# Patient Record
Sex: Male | Born: 1950 | Race: White | Hispanic: No | Marital: Married | Smoking: Never smoker
Health system: Southern US, Community
[De-identification: ages and names within clinical notes are randomized; demographics above are authoritative.]

## PROBLEM LIST (undated history)

## (undated) DIAGNOSIS — K7460 Unspecified cirrhosis of liver: Secondary | ICD-10-CM

## (undated) DIAGNOSIS — E119 Type 2 diabetes mellitus without complications: Secondary | ICD-10-CM

## (undated) HISTORY — PX: TOTAL KNEE ARTHROPLASTY: SHX125

## (undated) HISTORY — PX: ROTATOR CUFF REPAIR: SHX139

---

## 2005-03-11 ENCOUNTER — Emergency Department: Payer: Self-pay | Admitting: Emergency Medicine

## 2007-08-05 ENCOUNTER — Other Ambulatory Visit: Payer: Self-pay

## 2007-08-05 ENCOUNTER — Ambulatory Visit: Payer: Self-pay | Admitting: Specialist

## 2007-08-12 ENCOUNTER — Ambulatory Visit: Payer: Self-pay | Admitting: Specialist

## 2007-09-20 ENCOUNTER — Emergency Department: Payer: Self-pay | Admitting: Emergency Medicine

## 2007-09-25 ENCOUNTER — Emergency Department: Payer: Self-pay | Admitting: Internal Medicine

## 2007-10-14 ENCOUNTER — Ambulatory Visit: Payer: Self-pay | Admitting: Specialist

## 2007-10-25 ENCOUNTER — Ambulatory Visit (HOSPITAL_COMMUNITY): Admission: RE | Admit: 2007-10-25 | Discharge: 2007-10-25 | Payer: Self-pay | Admitting: Neurosurgery

## 2007-12-01 ENCOUNTER — Encounter: Payer: Self-pay | Admitting: Neurosurgery

## 2007-12-02 ENCOUNTER — Encounter: Payer: Self-pay | Admitting: Neurosurgery

## 2008-01-02 ENCOUNTER — Encounter: Payer: Self-pay | Admitting: Neurosurgery

## 2008-01-25 ENCOUNTER — Ambulatory Visit: Payer: Self-pay | Admitting: Neurosurgery

## 2008-01-30 ENCOUNTER — Encounter: Payer: Self-pay | Admitting: Neurosurgery

## 2009-07-16 ENCOUNTER — Ambulatory Visit: Payer: Self-pay | Admitting: Specialist

## 2009-07-23 ENCOUNTER — Inpatient Hospital Stay: Payer: Self-pay | Admitting: Specialist

## 2009-09-11 ENCOUNTER — Encounter: Payer: Self-pay | Admitting: Specialist

## 2009-10-01 ENCOUNTER — Encounter: Payer: Self-pay | Admitting: Specialist

## 2009-10-31 ENCOUNTER — Encounter: Payer: Self-pay | Admitting: Specialist

## 2010-08-12 ENCOUNTER — Emergency Department: Payer: Self-pay | Admitting: Emergency Medicine

## 2011-04-15 NOTE — Op Note (Signed)
Jesus [name], Jesus [name]                 ACCOUNT NO.:  [id number]   MEDICAL RECORD NO.:  [id number]          PATIENT TYPE:  AMB   LOCATION:  SDS                          FACILITY:  [facility]   PHYSICIAN:  Henry A. Pool, M.D.    DATE OF BIRTH:  [birth year]   DATE OF PROCEDURE:  10/25/2007  DATE OF DISCHARGE:                               OPERATIVE REPORT   SERVICE:  Neurosurgery.   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   PROCEDURE:  Left L4-5 laminotomy with microdiskectomy.   SURGEON:  Kathaleen Maser. Pool, M.D.   ASSISTANT:  Donalee Citrin, M.D.   ANESTHESIA:  General orotracheal.   INDICATIONS FOR PROCEDURE:  Mr. Favila is a 60 year old male with a  history of severe back and left lower extremity pain failing  conservative management.  Workup demonstrates evidence of a large  central disk herniation at L4-5 with marked compression of the thecal  sac slightly towards the left.  The patient has been counseled as to his  options.  He decided proceed with a left-sided L4-5 laminotomy and  microdiskectomy in hopes of improving his symptoms.   DESCRIPTION OF PROCEDURE:  The patient was placed on the operating room  table in the supine position and after and adequate level of anesthesia  was achieved, the patient prone onto Wilson frame, appropriately padded,  the patient's lumbar region was prepped and draped sterilely.  A 10  blade was used to make a linear incision overlying the L4-5 interspace.  This was carried sharply in the midline, subperiosteal dissection then  performed exposing the lamina of the facet joints at L4 and L5 on the  left side.  Deep self-retaining retractors placed, intraoperative x-rays  taken, the level was confirmed.  A laminotomy was then performed using a  high-speed drill and Kerrison rongeurs to remove the inferior aspect of  the lamina  of L4, medial aspect of the L4-5 facet joint and the superior  rim of the L5 lamina. Note the level originally exposed on the  localizing x-ray was L5-S1, our dissection was redirected one level  cephalad from the intraoperative x-ray.  Ligament flavum was then  elevated and resected in a piecemeal fashion using Kerrison rongeurs.  The underlying thecal sac at the exiting L5 nerve root were identified.  The microscope was then brought onto the field using microdissection of  the left-sided L5 nerve root and underlying disk herniation.  The  epidural venous plexus was coagulated and cut.  The thecal sac and L5  nerve root were gently mobilized and tracked towards the midline. The  disk space was then incised with a 15 blade in a rectangular fashion.  Wide disk space clean-out was then achieved using pituitary rongeurs,  __________  rongeurs and Epstein curettes. All elements including the  disk herniation were completely resected including a very large amount  of central freely fragmented parts.  All loose or obviously degenerative  disk material was removed from the interspace.  At this  point, a very  thorough decompression had been achieved.  There is no evidence of  injury to the thecal sac or nerve roots.  There is no evidence of any  compression upon the thecal sac or nerve roots.  The wound is then  irrigated with antibiotic solution.  Gelfoam was placed topically for  hemostasis and found to be good. The microscope and retractors were  removed.  Epidural bleeding was heavy throughout the case.  This was  controlled at the end of the  case and at the time of closure,  there was no evidence of any ongoing  epidural bleeding.  The wound was then closed in typical fashion.  Steri-  Strips and sterile dressings were applied. There were no apparent  complications. The patient tolerated the procedure well and he returns  to the recovery room postoperatively.            ______________________________  Kathaleen Maser Pool, M.D.     HAP/MEDQ  D:  10/25/2007  T:  10/25/2007  Job:  [id number]

## 2011-09-09 LAB — BASIC METABOLIC PANEL
BUN: 23
Chloride: 103
GFR calc Af Amer: >60
Potassium: 4.7

## 2011-09-09 LAB — CBC
HCT: 40.3
Hemoglobin: 13.5
MCV: 86.1
Platelets: 169
WBC: 5.3

## 2011-09-09 LAB — TYPE AND SCREEN: Antibody Screen: NEGATIVE

## 2011-09-09 LAB — DIFFERENTIAL
Eosinophils Relative: 8 — ABNORMAL HIGH
Lymphocytes Relative: 33
Lymphs Abs: 1.7
Monocytes Absolute: 0.5
Monocytes Relative: 10
Neutro Abs: 2.6

## 2011-09-09 LAB — ABO/RH: ABO/RH(D): O POS

## 2017-05-28 ENCOUNTER — Emergency Department: Payer: Medicare PPO

## 2017-05-28 ENCOUNTER — Encounter: Payer: Self-pay | Admitting: *Deleted

## 2017-05-28 ENCOUNTER — Inpatient Hospital Stay
Admission: EM | Admit: 2017-05-28 | Discharge: 2017-06-04 | DRG: 872 | Disposition: A | Discharge status: Home / Self Care | Payer: Medicare PPO | Attending: Internal Medicine | Admitting: Internal Medicine

## 2017-05-28 DIAGNOSIS — K7290 Hepatic failure, unspecified without coma: Secondary | ICD-10-CM | POA: Diagnosis present

## 2017-05-28 DIAGNOSIS — K7460 Unspecified cirrhosis of liver: Secondary | ICD-10-CM | POA: Diagnosis present

## 2017-05-28 DIAGNOSIS — E119 Type 2 diabetes mellitus without complications: Secondary | ICD-10-CM | POA: Diagnosis present

## 2017-05-28 DIAGNOSIS — Z96653 Presence of artificial knee joint, bilateral: Secondary | ICD-10-CM | POA: Diagnosis present

## 2017-05-28 DIAGNOSIS — R7881 Bacteremia: Secondary | ICD-10-CM | POA: Diagnosis not present

## 2017-05-28 DIAGNOSIS — A419 Sepsis, unspecified organism: Secondary | ICD-10-CM | POA: Diagnosis present

## 2017-05-28 DIAGNOSIS — Z794 Long term (current) use of insulin: Secondary | ICD-10-CM

## 2017-05-28 DIAGNOSIS — B952 Enterococcus as the cause of diseases classified elsewhere: Secondary | ICD-10-CM | POA: Diagnosis present

## 2017-05-28 DIAGNOSIS — N390 Urinary tract infection, site not specified: Secondary | ICD-10-CM | POA: Diagnosis present

## 2017-05-28 DIAGNOSIS — Z880 Allergy status to penicillin: Secondary | ICD-10-CM | POA: Diagnosis not present

## 2017-05-28 DIAGNOSIS — I340 Nonrheumatic mitral (valve) insufficiency: Secondary | ICD-10-CM | POA: Diagnosis not present

## 2017-05-28 DIAGNOSIS — A409 Streptococcal sepsis, unspecified: Secondary | ICD-10-CM | POA: Diagnosis present

## 2017-05-28 DIAGNOSIS — R509 Fever, unspecified: Secondary | ICD-10-CM

## 2017-05-28 HISTORY — DX: Unspecified cirrhosis of liver: K74.60

## 2017-05-28 HISTORY — DX: Type 2 diabetes mellitus without complications: E11.9

## 2017-05-28 LAB — CBC WITH DIFFERENTIAL/PLATELET
BASOS PCT: 1 %
Basophils Absolute: 0 10*3/uL (ref 0–0.1)
EOS PCT: 1 %
Eosinophils Absolute: 0 10*3/uL (ref 0–0.7)
HCT: 33.7 % — ABNORMAL LOW (ref 40.0–52.0)
Hemoglobin: 11 g/dL — ABNORMAL LOW (ref 13.0–18.0)
LYMPHS ABS: 0.1 10*3/uL — AB (ref 1.0–3.6)
Lymphocytes Relative: 5 %
MCH: 27.1 pg (ref 26.0–34.0)
MCHC: 32.7 g/dL (ref 32.0–36.0)
MCV: 82.8 fL (ref 80.0–100.0)
MONO ABS: 0.1 10*3/uL — AB (ref 0.2–1.0)
MONOS PCT: 2 %
NEUTROS ABS: 2.3 10*3/uL (ref 1.4–6.5)
Neutrophils Relative %: 91 %
PLATELETS: 74 10*3/uL — AB (ref 150–440)
RBC: 4.07 MIL/uL — ABNORMAL LOW (ref 4.40–5.90)
RDW: 18.3 % — ABNORMAL HIGH (ref 11.5–14.5)
WBC: 2.5 10*3/uL — ABNORMAL LOW (ref 3.8–10.6)

## 2017-05-28 LAB — URINALYSIS, COMPLETE (UACMP) WITH MICROSCOPIC
BACTERIA UA: NONE SEEN
BILIRUBIN URINE: NEGATIVE
KETONES UR: NEGATIVE mg/dL
NITRITE: NEGATIVE
PROTEIN: 30 mg/dL — AB
Specific Gravity, Urine: 1.009 (ref 1.005–1.030)
pH: 5 (ref 5.0–8.0)

## 2017-05-28 LAB — BLOOD GAS, ARTERIAL
ACID-BASE DEFICIT: 0.7 mmol/L (ref 0.0–2.0)
BICARBONATE: 23.1 mmol/L (ref 20.0–28.0)
FIO2: 0.21
O2 Saturation: 90.3 %
PH ART: 7.44 (ref 7.350–7.450)
Patient temperature: 37
pCO2 arterial: 34 mmHg (ref 32.0–48.0)
pO2, Arterial: 57 mmHg — ABNORMAL LOW (ref 83.0–108.0)

## 2017-05-28 LAB — PROTIME-INR
INR: 1.29
PROTHROMBIN TIME: 16.2 s — AB (ref 11.4–15.2)

## 2017-05-28 LAB — COMPREHENSIVE METABOLIC PANEL
ALBUMIN: 3.1 g/dL — AB (ref 3.5–5.0)
ALT: 24 U/L (ref 17–63)
AST: 49 U/L — AB (ref 15–41)
Alkaline Phosphatase: 124 U/L (ref 38–126)
Anion gap: 10 (ref 5–15)
BUN: 14 mg/dL (ref 6–20)
CHLORIDE: 107 mmol/L (ref 101–111)
CO2: 22 mmol/L (ref 22–32)
CREATININE: 1.16 mg/dL (ref 0.61–1.24)
Calcium: 9.2 mg/dL (ref 8.9–10.3)
GFR calc Af Amer: >60 mL/min (ref >60)
GLUCOSE: 258 mg/dL — AB (ref 65–99)
Potassium: 3.5 mmol/L (ref 3.5–5.1)
SODIUM: 139 mmol/L (ref 135–145)
Total Bilirubin: 1.2 mg/dL (ref 0.3–1.2)
Total Protein: 6.8 g/dL (ref 6.5–8.1)

## 2017-05-28 LAB — GLUCOSE, CAPILLARY
Glucose-Capillary: 305 mg/dL — ABNORMAL HIGH (ref 65–99)
Glucose-Capillary: 265 mg/dL — ABNORMAL HIGH (ref 65–99)
Glucose-Capillary: 253 mg/dL — ABNORMAL HIGH (ref 65–99)

## 2017-05-28 LAB — TYPE AND SCREEN
ABO/RH(D): O POS
Antibody Screen: NEGATIVE

## 2017-05-28 LAB — TSH: TSH: 1.524 u[IU]/mL (ref 0.350–4.500)

## 2017-05-28 LAB — LACTIC ACID, PLASMA
LACTIC ACID, VENOUS: 4.6 mmol/L — AB (ref 0.5–1.9)
Lactic Acid, Venous: 3.1 mmol/L (ref 0.5–1.9)

## 2017-05-28 MED ORDER — INSULIN ASPART 100 UNIT/ML ~~LOC~~ SOLN
0.0000 [IU] | Freq: Three times a day (TID) | SUBCUTANEOUS | Status: DC
  Administered 2017-05-28: 5 [IU] via SUBCUTANEOUS
  Administered 2017-05-29: 2 [IU] via SUBCUTANEOUS
  Administered 2017-05-29 (×2): 3 [IU] via SUBCUTANEOUS
  Administered 2017-05-30: 7 [IU] via SUBCUTANEOUS
  Administered 2017-05-30: 2 [IU] via SUBCUTANEOUS
  Administered 2017-05-30 – 2017-05-31 (×3): 3 [IU] via SUBCUTANEOUS
  Administered 2017-05-31 – 2017-06-01 (×2): 5 [IU] via SUBCUTANEOUS
  Administered 2017-06-01: 3 [IU] via SUBCUTANEOUS
  Administered 2017-06-01 – 2017-06-02 (×3): 5 [IU] via SUBCUTANEOUS
  Administered 2017-06-02: 2 [IU] via SUBCUTANEOUS
  Administered 2017-06-03: 7 [IU] via SUBCUTANEOUS
  Administered 2017-06-03: 5 [IU] via SUBCUTANEOUS
  Administered 2017-06-03: 3 [IU] via SUBCUTANEOUS
  Administered 2017-06-04: 2 [IU] via SUBCUTANEOUS
  Administered 2017-06-04: 8 [IU] via SUBCUTANEOUS
  Filled 2017-05-28 (×21): qty 1

## 2017-05-28 MED ORDER — LEVOFLOXACIN IN D5W 750 MG/150ML IV SOLN
750.0000 mg | Freq: Once | INTRAVENOUS | Status: AC
  Administered 2017-05-28: 750 mg via INTRAVENOUS
  Filled 2017-05-28: qty 150

## 2017-05-28 MED ORDER — DEXTROSE 5 % IV SOLN
1.0000 g | Freq: Three times a day (TID) | INTRAVENOUS | Status: DC
  Administered 2017-05-28: 1 g via INTRAVENOUS
  Filled 2017-05-28 (×3): qty 1

## 2017-05-28 MED ORDER — DEXTROSE 5 % IV SOLN
1.0000 g | INTRAVENOUS | Status: DC
  Administered 2017-05-28 – 2017-05-29 (×2): 1 g via INTRAVENOUS
  Filled 2017-05-28 (×2): qty 10

## 2017-05-28 MED ORDER — INSULIN ASPART 100 UNIT/ML ~~LOC~~ SOLN
0.0000 [IU] | Freq: Every day | SUBCUTANEOUS | Status: DC
  Administered 2017-05-28: 3 [IU] via SUBCUTANEOUS
  Administered 2017-05-29: 2 [IU] via SUBCUTANEOUS
  Administered 2017-05-30: 5 [IU] via SUBCUTANEOUS
  Administered 2017-05-31 – 2017-06-01 (×2): 4 [IU] via SUBCUTANEOUS
  Administered 2017-06-02: 5 [IU] via SUBCUTANEOUS
  Administered 2017-06-03: 4 [IU] via SUBCUTANEOUS
  Filled 2017-05-28 (×7): qty 1

## 2017-05-28 MED ORDER — DEXTROSE 5 % IV SOLN
2.0000 g | Freq: Once | INTRAVENOUS | Status: AC
  Administered 2017-05-28: 2 g via INTRAVENOUS
  Filled 2017-05-28: qty 2

## 2017-05-28 MED ORDER — ENOXAPARIN SODIUM 40 MG/0.4ML ~~LOC~~ SOLN: 40.0000 mg | SUBCUTANEOUS | Status: DC

## 2017-05-28 MED ORDER — OXYCODONE-ACETAMINOPHEN 5-325 MG PO TABS
1.0000 | ORAL_TABLET | ORAL | Status: DC | PRN
  Administered 2017-05-28 – 2017-06-03 (×21): 1 via ORAL
  Filled 2017-05-28 (×21): qty 1

## 2017-05-28 MED ORDER — SODIUM CHLORIDE 0.9 % IV BOLUS (SEPSIS)
1000.0000 mL | Freq: Once | INTRAVENOUS | Status: AC
  Administered 2017-05-28: 1000 mL via INTRAVENOUS

## 2017-05-28 MED ORDER — CEFTRIAXONE SODIUM-DEXTROSE 1-3.74 GM-% IV SOLR
1.0000 g | INTRAVENOUS | Status: DC
  Filled 2017-05-28: qty 50

## 2017-05-28 MED ORDER — SODIUM CHLORIDE 0.9 % IV SOLN
INTRAVENOUS | Status: DC
  Administered 2017-05-28 – 2017-05-29 (×5): via INTRAVENOUS

## 2017-05-28 MED ORDER — LEVOFLOXACIN IN D5W 750 MG/150ML IV SOLN: 750.0000 mg | INTRAVENOUS | Status: DC

## 2017-05-28 MED ORDER — PANTOPRAZOLE SODIUM 40 MG IV SOLR
40.0000 mg | Freq: Two times a day (BID) | INTRAVENOUS | Status: DC
  Administered 2017-05-28 – 2017-06-01 (×10): 40 mg via INTRAVENOUS
  Filled 2017-05-28 (×10): qty 40

## 2017-05-28 MED ORDER — ONDANSETRON HCL 4 MG/2ML IJ SOLN: 4.0000 mg | Freq: Four times a day (QID) | INTRAMUSCULAR | Status: DC | PRN

## 2017-05-28 MED ORDER — VANCOMYCIN HCL 10 G IV SOLR
1250.0000 mg | Freq: Two times a day (BID) | INTRAVENOUS | Status: DC
  Administered 2017-05-28: 1250 mg via INTRAVENOUS
  Filled 2017-05-28 (×2): qty 1250

## 2017-05-28 MED ORDER — ONDANSETRON HCL 4 MG PO TABS: 4.0000 mg | ORAL_TABLET | Freq: Four times a day (QID) | ORAL | Status: DC | PRN

## 2017-05-28 MED ORDER — VANCOMYCIN HCL IN DEXTROSE 1-5 GM/200ML-% IV SOLN
1000.0000 mg | INTRAVENOUS | Status: AC
  Administered 2017-05-28: 1000 mg via INTRAVENOUS
  Filled 2017-05-28: qty 200

## 2017-05-28 MED ORDER — ACETAMINOPHEN 650 MG RE SUPP
975.0000 mg | Freq: Once | RECTAL | Status: AC
  Administered 2017-05-28: 975 mg via RECTAL
  Filled 2017-05-28: qty 1

## 2017-05-28 MED ORDER — DOCUSATE SODIUM 100 MG PO CAPS
100.0000 mg | ORAL_CAPSULE | Freq: Two times a day (BID) | ORAL | Status: DC
  Administered 2017-05-28 – 2017-06-03 (×11): 100 mg via ORAL
  Filled 2017-05-28 (×13): qty 1

## 2017-05-28 MED ORDER — PROPRANOLOL HCL 20 MG PO TABS
10.0000 mg | ORAL_TABLET | Freq: Three times a day (TID) | ORAL | Status: DC
  Administered 2017-05-28 – 2017-06-03 (×21): 10 mg via ORAL
  Filled 2017-05-28 (×20): qty 1

## 2017-05-28 MED ORDER — SODIUM CHLORIDE 0.9 % IV BOLUS (SEPSIS)
500.0000 mL | Freq: Once | INTRAVENOUS | Status: AC
  Administered 2017-05-28: 500 mL via INTRAVENOUS

## 2017-05-28 NOTE — Progress Notes (Signed)
Inpatient Diabetes Program Recommendations  AACE/ADA: New Consensus Statement on Inpatient Glycemic Control (2015)  Target Ranges:  Prepandial:   less than 140 mg/dL      Peak postprandial:   less than 180 mg/dL (1-2 hours)      Critically ill patients:  140 - 180 mg/dL   Results for Jesus [name], [name] (MRN [id number]) as of 05/28/2017 11:36  Ref. Range 05/28/2017 00:48  Glucose Latest Ref Range: 65 - 99 mg/dL 782258 (H)   Review of Glycemic Control  Diabetes history: No Outpatient Diabetes medications: NA Current orders for Inpatient glycemic control: None  Inpatient Diabetes Program Recommendations: Correction (SSI): While inpatient, please consider ordering CBGs with Novolog sensitive correction scale ACHS. HgbA1C: A1C in process. Diet: Please consider adding Carb Modified to Heart Healthy diet.  Thanks, Orlando PennerMarie Monzerrat Wellen, RN, MSN, CDE Diabetes Coordinator Inpatient Diabetes Program [phone] (Team Pager from 8am to 5pm)

## 2017-05-28 NOTE — ED Notes (Signed)
Pt transported to room 159 

## 2017-05-28 NOTE — H&P (Signed)
Jesus [name] is an 66 y.o. male.   Chief Complaint: Vomiting HPI: The patient with past medical history of cirrhosis of the liver presents to the emergency department due to vomiting. The patient states that his emesis was dark and could have appeared like coffee grounds but he is unsure. Dark staining of his lips corroborates his concern. In the emergency department he was initially febrile, tachycardic and tachypneic. Laboratory evaluation revealed leukopenia as well as elevated lactic acid. Code sepsis was initiated and the emergency department staff called the hospitalist service for admission.  Past Medical History:  Diagnosis Date  . Cirrhosis of liver [facility])     Past Surgical History:  Procedure Laterality Date  . ROTATOR CUFF REPAIR Right   . TOTAL KNEE ARTHROPLASTY Bilateral     Family History  Problem Relation Age of Onset  . Diabetes Mellitus II Father    Social History:  reports that he has never smoked. He has never used smokeless tobacco. He reports that he does not drink alcohol or use drugs.  Allergies:  Allergies  Allergen Reactions  . Penicillins Other (See Comments)    No prescriptions prior to admission.  [facility]  Results for orders placed or performed during the hospital encounter of 05/28/17 (from the past 48 hour(s))  Blood gas, arterial ([facility], [facility], [facility])     Status: Abnormal   Collection Time: 05/28/17 12:46 AM  Result Value Ref Range   FIO2 0.21    pH, Arterial 7.44 7.350 - 7.450   pCO2 arterial 34 32.0 - 48.0 mmHg   pO2, Arterial 57 (L) 83.0 - 108.0 mmHg   Bicarbonate 23.1 20.0 - 28.0 mmol/L   Acid-base deficit 0.7 0.0 - 2.0 mmol/L   O2 Saturation 90.3 %   Patient temperature 37.0    Collection site RIGHT RADIAL    Sample type ARTERIAL DRAW    Allens test (pass/fail) PASS PASS  Comprehensive metabolic panel     Status: Abnormal   Collection Time: 05/28/17 12:48 AM  Result Value Ref Range   Sodium 139 135 - 145  mmol/L   Potassium 3.5 3.5 - 5.1 mmol/L   Chloride 107 101 - 111 mmol/L   CO2 22 22 - 32 mmol/L   Glucose, Bld 258 (H) 65 - 99 mg/dL   BUN 14 6 - 20 mg/dL   Creatinine, Ser 1.16 0.61 - 1.24 mg/dL   Calcium 9.2 8.9 - 10.3 mg/dL   Total Protein 6.8 6.5 - 8.1 g/dL   Albumin 3.1 (L) 3.5 - 5.0 g/dL   AST 49 (H) 15 - 41 U/L   ALT 24 17 - 63 U/L   Alkaline Phosphatase 124 38 - 126 U/L   Total Bilirubin 1.2 0.3 - 1.2 mg/dL   GFR calc non Af Amer >60 >60 mL/min   GFR calc Af Amer >60 >60 mL/min    Comment: (NOTE) The eGFR has been calculated using the CKD EPI equation. This calculation has not been validated in all clinical situations. eGFR's persistently <60 mL/min signify possible Chronic Kidney Disease.    Anion gap 10 5 - 15  CBC WITH DIFFERENTIAL     Status: Abnormal   Collection Time: 05/28/17 12:48 AM  Result Value Ref Range   WBC 2.5 (L) 3.8 - 10.6 K/uL   RBC 4.07 (L) 4.40 - 5.90 MIL/uL   Hemoglobin 11.0 (L) 13.0 - 18.0 g/dL   HCT 33.7 (L) 40.0 - 52.0 %  MCV 82.8 80.0 - 100.0 fL   MCH 27.1 26.0 - 34.0 pg   MCHC 32.7 32.0 - 36.0 g/dL   RDW 18.3 (H) 11.5 - 14.5 %   Platelets 74 (L) 150 - 440 K/uL   Neutrophils Relative % 91 %   Lymphocytes Relative 5 %   Monocytes Relative 2 %   Eosinophils Relative 1 %   Basophils Relative 1 %   Neutro Abs 2.3 1.4 - 6.5 K/uL   Lymphs Abs 0.1 (L) 1.0 - 3.6 K/uL   Monocytes Absolute 0.1 (L) 0.2 - 1.0 K/uL   Eosinophils Absolute 0.0 0 - 0.7 K/uL   Basophils Absolute 0.0 0 - 0.1 K/uL  Blood Culture (routine x 2)     Status: None (Preliminary result)   Collection Time: 05/28/17 12:48 AM  Result Value Ref Range   Specimen Description BLOOD RT AC    Special Requests      BOTTLES DRAWN AEROBIC AND ANAEROBIC Blood Culture results may not be optimal due to an excessive volume of blood received in culture bottles   Culture NO GROWTH < 12 HOURS    Report Status PENDING   Blood Culture (routine x 2)     Status: None (Preliminary result)    Collection Time: 05/28/17 12:48 AM  Result Value Ref Range   Specimen Description BLOOD LT AC    Special Requests      BOTTLES DRAWN AEROBIC AND ANAEROBIC Blood Culture results may not be optimal due to an excessive volume of blood received in culture bottles   Culture NO GROWTH < 12 HOURS    Report Status PENDING   Type and screen [place]     Status: None   Collection Time: 05/28/17 12:48 AM  Result Value Ref Range   ABO/RH(D) O POS    Antibody Screen NEG    Sample Expiration 05/31/2017   Protime-INR     Status: Abnormal   Collection Time: 05/28/17 12:48 AM  Result Value Ref Range   Prothrombin Time 16.2 (H) 11.4 - 15.2 seconds   INR 1.29   Urinalysis, Complete w Microscopic     Status: Abnormal   Collection Time: 05/28/17 12:48 AM  Result Value Ref Range   Color, Urine YELLOW (A) YELLOW   APPearance HAZY (A) CLEAR   Specific Gravity, Urine 1.009 1.005 - 1.030   pH 5.0 5.0 - 8.0   Glucose, UA >=500 (A) NEGATIVE mg/dL   Hgb urine dipstick LARGE (A) NEGATIVE   Bilirubin Urine NEGATIVE NEGATIVE   Ketones, ur NEGATIVE NEGATIVE mg/dL   Protein, ur 30 (A) NEGATIVE mg/dL   Nitrite NEGATIVE NEGATIVE   Leukocytes, UA SMALL (A) NEGATIVE   RBC / HPF TOO NUMEROUS TO COUNT 0 - 5 RBC/hpf   WBC, UA 6-30 0 - 5 WBC/hpf   Bacteria, UA NONE SEEN NONE SEEN   Squamous Epithelial / LPF 0-5 (A) NONE SEEN   WBC Clumps PRESENT    Mucous PRESENT   Lactic acid, plasma     Status: Abnormal   Collection Time: 05/28/17 12:48 AM  Result Value Ref Range   Lactic Acid, Venous 4.6 (HH) 0.5 - 1.9 mmol/L    Comment: CRITICAL RESULT CALLED TO, READ BACK BY AND VERIFIED WITH TAMMY COYNE @ 0229 ON 05/28/2017 BY [facility]F   TSH     Status: None   Collection Time: 05/28/17 12:48 AM  Result Value Ref Range   TSH 1.524 0.350 - 4.500 uIU/mL    Comment:  Performed by a 3rd Generation assay with a functional sensitivity of <=0.01 uIU/mL.  Lactic acid, plasma     Status: Abnormal   Collection  Time: 05/28/17  4:53 AM  Result Value Ref Range   Lactic Acid, Venous 3.1 (HH) 0.5 - 1.9 mmol/L    Comment: CRITICAL RESULT CALLED TO, READ BACK BY AND VERIFIED WITH RASHIDA HANEY @ 0540 ON 05/28/2017 BY CAF    Dg Chest Port 1 View  Result Date: 05/28/2017 CLINICAL DATA:  Sepsis nausea vomiting and fever EXAM: PORTABLE CHEST 1 VIEW COMPARISON:  10/22/2007 FINDINGS: Patient is rotated. There is cardiomegaly, likely exaggerated by patient rotation. No focal consolidation or large pleural effusion is seen. No pneumothorax. IMPRESSION: Cardiomegaly.  No edema or infiltrate. Electronically Signed   By: Donavan Foil M.D.   On: 05/28/2017 01:44    Review of Systems  Constitutional: Negative for chills and fever.  HENT: Negative for sore throat and tinnitus.   Eyes: Negative for blurred vision and redness.  Respiratory: Negative for cough and shortness of breath.   Cardiovascular: Negative for chest pain, palpitations, orthopnea and PND.  Gastrointestinal: Positive for nausea and vomiting. Negative for abdominal pain and diarrhea.  Genitourinary: Negative for dysuria, frequency and urgency.  Musculoskeletal: Negative for joint pain and myalgias.  Skin: Negative for rash.       No lesions  Neurological: Negative for speech change, focal weakness and weakness.  Endo/Heme/Allergies: Does not bruise/bleed easily.       No temperature intolerance  Psychiatric/Behavioral: Negative for depression and suicidal ideas.    Blood pressure 124/72, pulse (!) 105, temperature 99.7 F (37.6 C), temperature source Oral, resp. rate 16, height 5' 8"  (1.727 m), weight 113.4 kg (250 lb), SpO2 94 %. Physical Exam  Constitutional: He is oriented to person, place, and time. He appears well-developed and well-nourished. No distress.  HENT:  Head: Normocephalic and atraumatic.  Mouth/Throat: Oropharynx is clear and moist.  Eyes: Conjunctivae and EOM are normal. Pupils are equal, round, and reactive to light. No  scleral icterus.  Neck: Normal range of motion. Neck supple. No JVD present. No tracheal deviation present. No thyromegaly present.  Cardiovascular: Normal rate, regular rhythm and normal heart sounds.  Exam reveals no gallop and no friction rub.   No murmur heard. Respiratory: Effort normal and breath sounds normal. No respiratory distress.  GI: Soft. Bowel sounds are normal. He exhibits no distension and no mass. There is tenderness. There is no rebound and no guarding.  Genitourinary:  Genitourinary Comments: Deferred  Musculoskeletal: Normal range of motion. He exhibits no edema.  Lymphadenopathy:    He has no cervical adenopathy.  Neurological: He is alert and oriented to person, place, and time. No cranial nerve deficit.  Skin: Skin is warm and dry. No rash noted. No erythema.  Psychiatric: He has a normal mood and affect. His behavior is normal. Judgment and thought content normal.     Assessment/Plan This is a 66 year old male admitted for sepsis. 1. Sepsis: The patient meets criteria via fever, leukopenia, tachycardia and tachypnea. He is hemodynamically stable. He is received a dose of levofloxacin. I have started him on aztreonam and vancomycin. Follow blood cultures for growth and sensitivities. Lactic acid is improving with IV hydration. The patient does not have septic shock. 2. 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 Consult gastroenterology. I have started the patient on propanolol 3. Cirrhosis of liver: The patient is jaundiced. Bilirubin is normal. INR mildly elevated 4. Back pain:  Acute on chronic; the patient suffered a fall 3 months ago. The pain does not radiate. Manage pain 5. DVT prophylaxis: SCDs 6. GI prophylaxis: Pantoprazole  The patient is a full code. Time spent on admission orders and patient care possibly 45 minutes  Harrie Foreman, MD 05/28/2017, 10:50 AM

## 2017-05-28 NOTE — ED Notes (Signed)
Pt brought in via ems from home with fever, vomiting and diarrhea.  Sx began today.  Iv started and labs sent.  md at bedside.  Sinus tach on monitor.  Pt answers some questions appro.

## 2017-05-28 NOTE — Progress Notes (Signed)
CRITICAL VALUE ALERT  Critical Value:  Lactic Acid 3.1  Date & Time Notied:  05/28/17 at 0540 (while getting patient settled in from being transf from the ED.)  Provider Notified: Notifed Dr. Sheryle Hailiamond at [phone]  Orders Received/Actions taken: To continue to monitor patient.

## 2017-05-28 NOTE — ED Triage Notes (Signed)
Pt brought in via ems from home with vomiting and diarrhea.  Pt lethargic.  md at bedside

## 2017-05-28 NOTE — Progress Notes (Signed)
Patient seen this morning and chart reviewed. Patient not complain nausea, vomiting or diarrhea. He would like to eat. Patient here with sepsis due to UTI Lactic acid is improving Consider changing antibiotics tomorrow to Rocephin if remains afebrile  Okay to place on diet.

## 2017-05-28 NOTE — Progress Notes (Addendum)
Pharmacy Antibiotic Note  Jesus [name] is a 66 y.o. male admitted on 05/28/2017 with UTI/sepsis.  Pharmacy has been consulted for aztreonam, Levaquin, and vancomycin dosing.  Plan: DW 86kg  Vd 60L kei 0.068 hr-1  T1/2 10 hours Vancomycin 1250 mg q 12 hours ordered with stacked dosing. Level before 5th dose. Goal trough 15-20  Aztreonam 1 gram q 8 hours ordered.  Levaquin 750 mg q 24 hours ordered.  Height: 5\' 8"  (172.7 cm) Weight: 250 lb (113.4 kg) IBW/kg (Calculated) : 68.4  Temp (24hrs), Avg:101.9 F (38.8 C), Min:100.7 F (38.2 C), Max:103.1 F (39.5 C)   Recent Labs Lab 05/28/17 0048  WBC 2.5*  CREATININE 1.16  LATICACIDVEN 4.6*    Estimated Creatinine Clearance: 76.6 mL/min (by C-G formula based on SCr of 1.16 mg/dL).    Allergies  Allergen Reactions  . Penicillins Other (See Comments)    Antimicrobials this admission: aztreonam Levaquin 6/28 >>    >>   Dose adjustments this admission:   Microbiology results: 6/28 BCx: pending 6/28 UCx: pending       6/28 UA: LE(+) NO2(-) WBC 6-30 6/28 CXR: no edema or infiltrate Thank you for allowing pharmacy to be a part of this patient's care.  Lajune Perine S 05/28/2017 5:10 AM

## 2017-05-28 NOTE — ED Provider Notes (Signed)
[facility] Emergency Department Provider Note   ____________________________________________   First MD Initiated Contact with Patient 05/28/17 0045     (approximate)  I have reviewed the triage vital signs and the nursing notes.   HISTORY  Chief Complaint Code Sepsis  Level V coveat: Patient with decreased LOC  HPI Jesus [name] is a 66 y.o. male brought to the ED from home via EMS with a chief complaint of fever, vomiting and diarrhea. Per EMS, spouse states patient complained of feeling badly with nausea approximately 9:30 PM. EMS called out for vomiting and diarrhea. Fever of 103 at the scene. Rest of history is limited; awaiting family to arrive.   No past medical history on file.  There are no active problems to display for this patient.   No past surgical history on file.  Prior to Admission medications   Not on File    Allergies Penicillins  No family history on file.  Social History Social History  Substance Use Topics  . Smoking status: Never Smoker  . Smokeless tobacco: Never Used  . Alcohol use No    Review of Systems  Constitutional: Positive for fever/chills. Eyes: No visual changes. ENT: No sore throat. Cardiovascular: Denies chest pain. Respiratory: Denies shortness of breath. Gastrointestinal: No abdominal pain.  As a for vomiting and diarrhea.  No constipation. Genitourinary: Negative for dysuria. Musculoskeletal: Negative for back pain. Skin: Negative for rash. Neurological: Negative for headaches, focal weakness or numbness.   ____________________________________________   PHYSICAL EXAM:  VITAL SIGNS: ED Triage Vitals  Enc Vitals Group     BP --      Pulse Rate 05/28/17 0044 (!) 125     Resp 05/28/17 0044 20     Temp 05/28/17 0044 (!) 103.1 F (39.5 C)     Temp Source 05/28/17 0044 Oral     SpO2 05/28/17 0044 95 %     Weight 05/28/17 0045 250 lb (113.4 kg)     Height 05/28/17 0045 5\' 8"  (1.727  m)     Head Circumference --      Peak Flow --      Pain Score --      Pain Loc --      Pain Edu? --      Excl. in GC? --     Constitutional: Disoriented, in mild acute distress. Eyes: Conjunctivae are normal. PERRL. EOMI. Head: Atraumatic. Nose: No congestion/rhinnorhea. Mouth/Throat: Mucous membranes are moist.  Oropharynx non-erythematous. Neck: No stridor.  Supple neck without meningismus. Cardiovascular: Normal rate, regular rhythm. Grossly normal heart sounds.  Good peripheral circulation. Respiratory: Normal respiratory effort.  No retractions. Lungs CTAB. Gastrointestinal: Soft and nontender to light or deep palpation. No distention. No abdominal bruits. No CVA tenderness. Musculoskeletal: No lower extremity tenderness nor edema.  No joint effusions. Neurologic:  Moaning, alert and oriented 2. MAEx4. No gross focal neurologic deficits are appreciated.  Skin:  Skin is hot, dry and intact. No rash noted. No petechiae. Psychiatric: Unable to assess. ____________________________________________   LABS (all labs ordered are listed, but only abnormal results are displayed)  Labs Reviewed  COMPREHENSIVE METABOLIC PANEL - Abnormal; Notable for the following:       Result Value   Glucose, Bld 258 (*)    Albumin 3.1 (*)    AST 49 (*)    All other components within normal limits  CBC WITH DIFFERENTIAL/PLATELET - Abnormal; Notable for the following:    WBC 2.5 (*)  RBC 4.07 (*)    Hemoglobin 11.0 (*)    HCT 33.7 (*)    RDW 18.3 (*)    Platelets 74 (*)    Lymphs Abs 0.1 (*)    Monocytes Absolute 0.1 (*)    All other components within normal limits  BLOOD GAS, ARTERIAL - Abnormal; Notable for the following:    pO2, Arterial 57 (*)    All other components within normal limits  PROTIME-INR - Abnormal; Notable for the following:    Prothrombin Time 16.2 (*)    All other components within normal limits  URINALYSIS, COMPLETE (UACMP) WITH MICROSCOPIC - Abnormal; Notable for the  following:    Color, Urine YELLOW (*)    APPearance HAZY (*)    Glucose, UA >=500 (*)    Hgb urine dipstick LARGE (*)    Protein, ur 30 (*)    Leukocytes, UA SMALL (*)    Squamous Epithelial / LPF 0-5 (*)    All other components within normal limits  CULTURE, BLOOD (ROUTINE X 2)  CULTURE, BLOOD (ROUTINE X 2)  URINE CULTURE  LACTIC ACID, PLASMA  LACTIC ACID, PLASMA  TYPE AND SCREEN   ____________________________________________  EKG  ED ECG REPORT I, Shanteria Laye J, the attending physician, personally viewed and interpreted this ECG.   Date: 05/28/2017  EKG Time: 0048  Rate: 121  Rhythm: sinus tachycardia  Axis: Normal  Intervals:none  ST&T Change: Nonspecific  ____________________________________________  RADIOLOGY  Dg Chest Port 1 View  Result Date: 05/28/2017 CLINICAL DATA:  Sepsis nausea vomiting and fever EXAM: PORTABLE CHEST 1 VIEW COMPARISON:  10/22/2007 FINDINGS: Patient is rotated. There is cardiomegaly, likely exaggerated by patient rotation. No focal consolidation or large pleural effusion is seen. No pneumothorax. IMPRESSION: Cardiomegaly.  No edema or infiltrate. Electronically Signed   By: Jasmine Pang M.D.   On: 05/28/2017 01:44    ____________________________________________   PROCEDURES  Procedure(s) performed: None  Procedures  Critical Care performed: Yes, see critical care note(s)  CRITICAL CARE Performed by: Irean Hong   Total critical care time: 45 minutes  Critical care time was exclusive of separately billable procedures and treating other patients.  Critical care was necessary to treat or prevent imminent or life-threatening deterioration.  Critical care was time spent personally by me on the following activities: development of treatment plan with patient and/or surrogate as well as nursing, discussions with consultants, evaluation of patient's response to treatment, examination of patient, obtaining history from patient or  surrogate, ordering and performing treatments and interventions, ordering and review of laboratory studies, ordering and review of radiographic studies, pulse oximetry and re-evaluation of patient's condition. ____________________________________________   INITIAL IMPRESSION / ASSESSMENT AND PLAN / ED COURSE  Pertinent labs & imaging results that were available during my care of the patient were reviewed by me and considered in my medical decision making (see chart for details).  66 year old male brought from home for fever, vomiting and diarrhea. Awaiting family's arrival for further history. Temperature 103.65F. ED code sepsis activated.  Clinical Course as of May 29 155  Thu May 28, 2017  0153 Patient appears more comfortable, making more sense and talkative. Wife at bedside. States patient began to have chills this evening followed by vomiting and diarrhea. Gives history of non-insulin-dependent diabetes, hypertension, cirrhosis; not on anticoagulation. Updated both on laboratory, urinalysis and imaging results. IV antibiotics ordered. Will discuss with hospitalist to evaluate patient in the emergency department for admission.  [JS]    Clinical Course User Index [  JS] Irean Hong, MD     ____________________________________________   FINAL CLINICAL IMPRESSION(S) / ED DIAGNOSES  Final diagnoses:  Fever, unspecified fever cause  Sepsis, due to unspecified organism [facility])  Urinary tract infection without hematuria, site unspecified      NEW MEDICATIONS STARTED DURING THIS VISIT:  New Prescriptions   No medications on file     Note:  This document was prepared using Dragon voice recognition software and may include unintentional dictation errors.    Irean Hong, MD 05/28/17 [phone]

## 2017-05-28 NOTE — Progress Notes (Signed)
Patient transferred to room 159 via stretcher from the ED. Oriented patient to room and room equipment and explained Fall Risk education during his stay. Patient currently request to eat but has orders for NPO. Notified Dr. Sheryle Hailiamond and orders give to continue NPO. Will explain importance of NPO at this time and continue to monitor patient to end of shift.

## 2017-05-28 NOTE — ED Notes (Signed)
Report off to butch rn  

## 2017-05-28 NOTE — Progress Notes (Signed)
[facility]. Pt asked and stated he would rather stay here. [facility] representative notified and she was given the care manager's number to follow-up so that the appropriate paperwork could be signed.

## 2017-05-28 NOTE — ED Notes (Signed)
Family with pt

## 2017-05-28 NOTE — Progress Notes (Signed)
Pt's family at bedside asking for update and enquiring about potential discharge plans. They state that the pt received an IV iron infusion this past Monday and are unsure if this may have contributed to this acute episode. Dr. Juliene PinaMody notified, no new orders received.

## 2017-05-29 ENCOUNTER — Encounter: Payer: Self-pay | Admitting: Neurology

## 2017-05-29 LAB — BASIC METABOLIC PANEL
Anion gap: 5 (ref 5–15)
BUN: 23 mg/dL — AB (ref 6–20)
CHLORIDE: 109 mmol/L (ref 101–111)
CO2: 21 mmol/L — ABNORMAL LOW (ref 22–32)
CREATININE: 1.12 mg/dL (ref 0.61–1.24)
Calcium: 8.1 mg/dL — ABNORMAL LOW (ref 8.9–10.3)
Glucose, Bld: 276 mg/dL — ABNORMAL HIGH (ref 65–99)
Potassium: 4.1 mmol/L (ref 3.5–5.1)
SODIUM: 135 mmol/L (ref 135–145)

## 2017-05-29 LAB — GLUCOSE, CAPILLARY
Glucose-Capillary: 174 mg/dL — ABNORMAL HIGH (ref 65–99)
Glucose-Capillary: 244 mg/dL — ABNORMAL HIGH (ref 65–99)
Glucose-Capillary: 234 mg/dL — ABNORMAL HIGH (ref 65–99)
Glucose-Capillary: 240 mg/dL — ABNORMAL HIGH (ref 65–99)

## 2017-05-29 LAB — CBC
HCT: 31.6 % — ABNORMAL LOW (ref 40.0–52.0)
Hemoglobin: 10.2 g/dL — ABNORMAL LOW (ref 13.0–18.0)
MCH: 27.1 pg (ref 26.0–34.0)
MCHC: 32.4 g/dL (ref 32.0–36.0)
MCV: 83.8 fL (ref 80.0–100.0)
PLATELETS: 64 10*3/uL — AB (ref 150–440)
RBC: 3.77 MIL/uL — AB (ref 4.40–5.90)
RDW: 18.7 % — AB (ref 11.5–14.5)
WBC: 6.5 10*3/uL (ref 3.8–10.6)

## 2017-05-29 LAB — HEMOGLOBIN A1C
Hgb A1c MFr Bld: 9.9 % — ABNORMAL HIGH (ref 4.8–5.6)
Mean Plasma Glucose: 237 mg/dL

## 2017-05-29 MED ORDER — FUROSEMIDE 10 MG/ML IJ SOLN
20.0000 mg | Freq: Once | INTRAMUSCULAR | Status: AC
  Administered 2017-05-29: 20 mg via INTRAVENOUS
  Filled 2017-05-29: qty 4

## 2017-05-29 MED ORDER — ACETAMINOPHEN 325 MG PO TABS
650.0000 mg | ORAL_TABLET | Freq: Four times a day (QID) | ORAL | Status: DC | PRN
  Administered 2017-05-29 – 2017-06-01 (×4): 650 mg via ORAL
  Filled 2017-05-29 (×4): qty 2

## 2017-05-29 MED ORDER — FUROSEMIDE 40 MG PO TABS
40.0000 mg | ORAL_TABLET | Freq: Every day | ORAL | Status: DC
  Administered 2017-05-30 – 2017-06-03 (×5): 40 mg via ORAL
  Filled 2017-05-29 (×5): qty 1

## 2017-05-29 MED ORDER — SPIRONOLACTONE 25 MG PO TABS
25.0000 mg | ORAL_TABLET | Freq: Every day | ORAL | Status: DC
  Administered 2017-05-29 – 2017-06-03 (×6): 25 mg via ORAL
  Filled 2017-05-29 (×6): qty 1

## 2017-05-29 NOTE — Progress Notes (Addendum)
[place] at [place] NAME: Jesus [name]    MR#:  [id number]  DATE OF BIRTH:  Apr 16, 1951  SUBJECTIVE:   Patient had fever last night.  REVIEW OF SYSTEMS:    Review of Systems  Constitutional:++fever,  NO chills weight loss HENT: Negative for ear pain, nosebleeds, congestion, facial swelling, rhinorrhea, neck pain, neck stiffness and ear discharge.   Respiratory: Negative for cough, shortness of breath, wheezing  Cardiovascular: Negative for chest pain, palpitations and leg swelling.  Gastrointestinal: Negative for heartburn, abdominal pain, vomiting, diarrhea or consitpation Genitourinary: Negative for dysuria, urgency, frequency, hematuria Musculoskeletal: Negative for back pain or joint pain Neurological: Negative for dizziness, seizures, syncope, focal weakness,  numbness and headaches.  Hematological: Does not bruise/bleed easily.  Psychiatric/Behavioral: Negative for hallucinations, confusion, dysphoric mood    Tolerating Diet: yes      DRUG ALLERGIES:   Allergies  Allergen Reactions  . Penicillins Other (See Comments)    VITALS:  Blood pressure 126/75, pulse 79, temperature 99.3 F (37.4 C), temperature source Oral, resp. rate 16, height _0  (1.727 m), weight 118.4 kg (261 lb), SpO2 96 %.  PHYSICAL EXAMINATION:  Constitutional: Appears well-developed and well-nourished. No distress. HENT: Normocephalic. Marland Kitchen Oropharynx is clear and moist.  Eyes: Conjunctivae and EOM are normal. PERRLA, no scleral icterus.  Neck: Normal ROM. Neck supple. No JVD. No tracheal deviation. CVS: RRR, S1/S2 +, 3/6 murmurs, no gallops, no carotid bruit.  Pulmonary: Effort and breath sounds normal, no stridor, rhonchi, wheezes, rales.  Abdominal: Soft. BS +,  no distension, tenderness, rebound or guarding.  Musculoskeletal: Normal range of motion. 1+ LEE and no tenderness.  Neuro: Alert. CN 2-12 grossly intact. No focal deficits. Skin: Skin is  warm and dry. No rash noted. Psychiatric: Normal mood and affect.      LABORATORY PANEL:   CBC  Recent Labs Lab 05/29/17 0941  WBC 6.5  HGB 10.2*  HCT 31.6*  PLT 64*   ------------------------------------------------------------------------------------------------------------------  Chemistries   Recent Labs Lab 05/28/17 0048 05/29/17 0941  NA 139 135  K 3.5 4.1  CL 107 109  CO2 22 21*  GLUCOSE 258* 276*  BUN 14 23*  CREATININE 1.16 1.12  CALCIUM 9.2 8.1*  AST 49*  --   ALT 24  --   ALKPHOS 124  --   BILITOT 1.2  --    ------------------------------------------------------------------------------------------------------------------  Cardiac Enzymes No results for input(s): TROPONINI in the last 168 hours. ------------------------------------------------------------------------------------------------------------------  RADIOLOGY:  Dg Chest Port 1 View  Result Date: 05/28/2017 CLINICAL DATA:  Sepsis nausea vomiting and fever EXAM: PORTABLE CHEST 1 VIEW COMPARISON:  10/22/2007 FINDINGS: Patient is rotated. There is cardiomegaly, likely exaggerated by patient rotation. No focal consolidation or large pleural effusion is seen. No pneumothorax. IMPRESSION: Cardiomegaly.  No edema or infiltrate. Electronically Signed   By: Donavan Foil M.D.   On: 05/28/2017 01:44     ASSESSMENT AND PLAN:    66 year old male with liver cirrhosis who presented with vomiting and fever.  1. Sepsis: Patient met criteria by fever, leukopenia, tachycardia and tachypnea Sepsis is due to UTI Lactic acid is improving Continues Rocephin for now Follow up on blood and urine culture  2. Vomiting: Patient has denied hematemesis He has not had abdominal pain or vomiting since admission  3.diabetes: Hemoglobin A1c greater than 9 Start sliding scale Diabetes coordinator recommendations Carb modified diet  4. History of liver cirrhosis: Start Lasix and Aldactone   Awaiting  outpatient medication list from [place].  Management plans discussed with the patient and he is in agreement.  CODE STATUS: full  TOTAL TIME TAKING CARE OF THIS PATIENT: 30 minutes.     POSSIBLE D/C 2 days, DEPENDING ON CLINICAL CONDITION.   Ceciley Buist M.D on 05/29/2017 at 10:40 AM  Between 7am to 6pm - Pager - [phone] After 6pm go to [email or web address] - password [email or web address] [facility]  Office  [phone]  CC: Primary care physician; System, Pcp Not In  Note: This dictation was prepared with Dragon dictation along with smaller phrase technology. Any transcriptional errors that result from this process are unintentional.

## 2017-05-29 NOTE — Progress Notes (Addendum)
Met with patient regarding diabetes- I introduced myself as the Diabetes Coordinator and asked what the MD has told him about his blood sugars.  He tells me he has been told his blood sugars are high and it will be treated with insulin while he is in the hospital.    I explained to him, that the A1C was obtained and though his current pain and infection may be contributing to the current elevated blood sugars, the blood sugars (based on the A1C) have been high for about 3 months.    Patient does not verbalize that he has been told he has diabetes.  When the patient is aware of the diagnosis, please order the Living Well with Diabetes book and begin teaching of the glucometer. When appropriate, please order the consult for the dietitian.   Please begin diabetes teaching as outlined in the diabetes care plan and by using the Living Well with Diabetes book.     Consider starting Lantus 18 units qhs (0.15units/kg)  Gentry Fitz, RN, [place], [place], CDE Diabetes Coordinator Inpatient Diabetes Program  [phone] (Team Pager) [phone] ([place]) 05/29/2017 1:49 PM

## 2017-05-29 NOTE — Plan of Care (Signed)
Problem: Safety: Goal: Ability to remain free from injury will improve Outcome: Progressing No injuries during this shift. Bed in lowest position, call bell in reach, bed alarm activated.

## 2017-05-29 NOTE — Progress Notes (Signed)
FBS 240

## 2017-05-29 NOTE — Care Management Important Message (Signed)
Important Message  Patient Details  Name: Deirdre PeerBruce W Guizar MRN: [id number] Date of Birth: 09/13/1951   Medicare Important Message Given:  Yes    Marily MemosLisa M Desmond Szabo, RN 05/29/2017, 12:10 PM

## 2017-05-29 NOTE — Care Management Note (Signed)
Case Management Note  Patient Details  Name: Jesus [name] MRN: [id number] Date of Birth: 09/28/1951  Subjective/Objective:   [facility] and [facility]. Verified this with patient. Completed the refusal for transfer form and faxed to [facility] TC to [place] and notified her of fax. VM left.                  Action/Plan:   Expected Discharge Date:                  Expected Discharge Plan:     In-House Referral:     Discharge planning Services  CM Consult  Post Acute Care Choice:    Choice offered to:     DME Arranged:    DME Agency:     HH Arranged:    HH Agency:     Status of Service:  In process, will continue to follow  If discussed at Long Length of Stay Meetings, dates discussed:    Additional Comments:  Marily MemosLisa M Kymere Fullington, RN 05/29/2017, 9:37 AM

## 2017-05-29 NOTE — Progress Notes (Signed)
Inpatient Diabetes Program Recommendations  AACE/ADA: New Consensus Statement on Inpatient Glycemic Control (2015)  Target Ranges:  Prepandial:   less than 140 mg/dL      Peak postprandial:   less than 180 mg/dL (1-2 hours)      Critically ill patients:  140 - 180 mg/dL   Lab Results  Component Value Date   GLUCAP 174 (H) 05/29/2017   HGBA1C 9.9 (H) 05/28/2017    Review of Glycemic Control  Results for Jesus [name], Marilyn W (MRN [id number]) as of 05/29/2017 08:45  Ref. Range 05/28/2017 12:41 05/28/2017 16:04 05/28/2017 21:18 05/29/2017 07:25  Glucose-Capillary Latest Ref Range: 65 - 99 mg/dL 409305 (H) 811265 (H) 914253 (H) 174 (H)    Diabetes history: No Outpatient Diabetes medications: NA Current orders for Inpatient glycemic control: Novolog 0-9 units tid, Novolog 0-5 units qhs  Inpatient Diabetes Program Recommendations:Elevated A1C 9.9%  (6.5 or greater). Per ADA guidelines this meets the criteria of a diagnosis for diabetes. If appropriate, please consider placing this diagnosis in chart and inform patient of diagnosis.   Susette RacerJulie Talani Brazee, RN, BA, MHA, CDE Diabetes Coordinator Inpatient Diabetes Program  [phone] (Team Pager) [phone] [facility] Office) 05/29/2017 8:48 AM

## 2017-05-30 LAB — URINE CULTURE: Culture: >=100000 — AB

## 2017-05-30 LAB — CBC
HEMATOCRIT: 31 % — AB (ref 40.0–52.0)
Hemoglobin: 10 g/dL — ABNORMAL LOW (ref 13.0–18.0)
MCH: 27.2 pg (ref 26.0–34.0)
MCHC: 32.3 g/dL (ref 32.0–36.0)
MCV: 84.4 fL (ref 80.0–100.0)
PLATELETS: 68 10*3/uL — AB (ref 150–440)
RBC: 3.67 MIL/uL — ABNORMAL LOW (ref 4.40–5.90)
RDW: 18.7 % — AB (ref 11.5–14.5)
WBC: 6 10*3/uL (ref 3.8–10.6)

## 2017-05-30 LAB — BASIC METABOLIC PANEL
Anion gap: 3 — ABNORMAL LOW (ref 5–15)
BUN: 22 mg/dL — ABNORMAL HIGH (ref 6–20)
CALCIUM: 8 mg/dL — AB (ref 8.9–10.3)
CO2: 25 mmol/L (ref 22–32)
CREATININE: 1.13 mg/dL (ref 0.61–1.24)
Chloride: 106 mmol/L (ref 101–111)
GLUCOSE: 213 mg/dL — AB (ref 65–99)
Potassium: 3.9 mmol/L (ref 3.5–5.1)
Sodium: 134 mmol/L — ABNORMAL LOW (ref 135–145)

## 2017-05-30 LAB — BLOOD CULTURE ID PANEL (REFLEXED)
Acinetobacter baumannii: NOT DETECTED
CANDIDA TROPICALIS: NOT DETECTED
Candida albicans: NOT DETECTED
Candida glabrata: NOT DETECTED
Candida krusei: NOT DETECTED
Candida parapsilosis: NOT DETECTED
Enterobacter cloacae complex: NOT DETECTED
Enterobacteriaceae species: NOT DETECTED
Enterococcus species: NOT DETECTED
Escherichia coli: NOT DETECTED
HAEMOPHILUS INFLUENZAE: NOT DETECTED
KLEBSIELLA PNEUMONIAE: NOT DETECTED
Klebsiella oxytoca: NOT DETECTED
Listeria monocytogenes: NOT DETECTED
NEISSERIA MENINGITIDIS: NOT DETECTED
PROTEUS SPECIES: NOT DETECTED
PSEUDOMONAS AERUGINOSA: NOT DETECTED
STAPHYLOCOCCUS AUREUS BCID: NOT DETECTED
STAPHYLOCOCCUS SPECIES: NOT DETECTED
STREPTOCOCCUS AGALACTIAE: NOT DETECTED
STREPTOCOCCUS SPECIES: NOT DETECTED
Serratia marcescens: NOT DETECTED
Streptococcus pneumoniae: NOT DETECTED
Streptococcus pyogenes: NOT DETECTED

## 2017-05-30 LAB — GLUCOSE, CAPILLARY
Glucose-Capillary: 191 mg/dL — ABNORMAL HIGH (ref 65–99)
Glucose-Capillary: 250 mg/dL — ABNORMAL HIGH (ref 65–99)
Glucose-Capillary: 348 mg/dL — ABNORMAL HIGH (ref 65–99)
Glucose-Capillary: 361 mg/dL — ABNORMAL HIGH (ref 65–99)

## 2017-05-30 MED ORDER — LIVING WELL WITH DIABETES BOOK
Freq: Once | Status: AC
  Administered 2017-05-30: 08:00:00
  Filled 2017-05-30: qty 1

## 2017-05-30 MED ORDER — VANCOMYCIN HCL 10 G IV SOLR
2000.0000 mg | Freq: Once | INTRAVENOUS | Status: AC
  Administered 2017-05-30: 2000 mg via INTRAVENOUS
  Filled 2017-05-30: qty 2000

## 2017-05-30 MED ORDER — INSULIN GLARGINE 100 UNIT/ML ~~LOC~~ SOLN
20.0000 [IU] | Freq: Every day | SUBCUTANEOUS | Status: DC
  Administered 2017-05-30 – 2017-06-01 (×3): 20 [IU] via SUBCUTANEOUS
  Filled 2017-05-30 (×3): qty 0.2

## 2017-05-30 MED ORDER — VANCOMYCIN HCL 10 G IV SOLR
1250.0000 mg | Freq: Two times a day (BID) | INTRAVENOUS | Status: DC
  Administered 2017-05-30 – 2017-06-02 (×6): 1250 mg via INTRAVENOUS
  Filled 2017-05-30 (×7): qty 1250

## 2017-05-30 NOTE — Progress Notes (Signed)
Pt alert and oriented this shift. Medicated for back pain x1 with good results. Up in chair some this evening. Pt able to sleep in between care. Low grade fever during the night.

## 2017-05-30 NOTE — Progress Notes (Addendum)
[place] at [place] NAME: Jesus [name]    MR#:  [id number]  DATE OF BIRTH:  06-19-1951  SUBJECTIVE:   Patient doing well this morning. No fevers overnight.  REVIEW OF SYSTEMS:    Review of Systems  ConstitutionalNo fevers  NO chills weight loss HENT: Negative for ear pain, nosebleeds, congestion, facial swelling, rhinorrhea, neck pain, neck stiffness and ear discharge.   Respiratory: Negative for cough, shortness of breath, wheezing  Cardiovascular: Negative for chest pain, palpitations and leg swelling.  Gastrointestinal: Negative for heartburn, abdominal pain, vomiting, diarrhea or consitpation Genitourinary: Negative for dysuria, urgency, frequency, hematuria Musculoskeletal: Negative for back pain or joint pain Neurological: Negative for dizziness, seizures, syncope, focal weakness,  numbness and headaches.  Hematological: Does not bruise/bleed easily.  Psychiatric/Behavioral: Negative for hallucinations, confusion, dysphoric mood    Tolerating Diet: yes      DRUG ALLERGIES:   Allergies  Allergen Reactions  . Penicillins Other (See Comments)    VITALS:  Blood pressure 121/67, pulse 88, temperature 99 F (37.2 C), temperature source Oral, resp. rate 18, height _0  (1.727 m), weight 118.4 kg (261 lb), SpO2 94 %.  PHYSICAL EXAMINATION:  Constitutional: Appears well-developed and well-nourished. No distress. HENT: Normocephalic. Marland Kitchen Oropharynx is clear and moist.  Eyes: Conjunctivae and EOM are normal. PERRLA, no scleral icterus.  Neck: Normal ROM. Neck supple. No JVD. No tracheal deviation. CVS: RRR, S1/S2 +, 3/6 murmur, no gallops, no carotid bruit.  Pulmonary: Effort and breath sounds normal, no stridor, rhonchi, wheezes, rales.  Abdominal: Soft. BS +,  no distension, tenderness, rebound or guarding.  Musculoskeletal: Normal range of motion. 1+ LEE and no tenderness.  Neuro: Alert. CN 2-12 grossly intact. No focal  deficits. Skin: Skin is warm and dry. No rash noted. Psychiatric: Normal mood and affect.      LABORATORY PANEL:   CBC  Recent Labs Lab 05/30/17 0417  WBC 6.0  HGB 10.0*  HCT 31.0*  PLT 68*   ------------------------------------------------------------------------------------------------------------------  Chemistries   Recent Labs Lab 05/28/17 0048  05/30/17 0417  NA 139  < > 134*  K 3.5  < > 3.9  CL 107  < > 106  CO2 22  < > 25  GLUCOSE 258*  < > 213*  BUN 14  < > 22*  CREATININE 1.16  < > 1.13  CALCIUM 9.2  < > 8.0*  AST 49*  --   --   ALT 24  --   --   ALKPHOS 124  --   --   BILITOT 1.2  --   --   < > = values in this interval not displayed. ------------------------------------------------------------------------------------------------------------------  Cardiac Enzymes No results for input(s): TROPONINI in the last 168 hours. ------------------------------------------------------------------------------------------------------------------  RADIOLOGY:  No results found.   ASSESSMENT AND PLAN:    66 year old male with liver cirrhosis who presented with vomiting and fever.  1. Sepsis With bacteremia GPC: Patient met criteria by fever, leukopenia, tachycardia and tachypnea Sepsis is due to UTI/bacteremia Urine culture growing enterococcus Continue vancomycin Repeat blood cx  2. Vomiting: Patient has denied hematemesis He has not had abdominal pain or vomiting since admission  3. Diabetes: Hemoglobin A1c greater than 9 Continue sliding scale Holding outpatient glyburide and metformin Continue Lantus Continue ADA diet  4. History of liver cirrhosis With chronic thrombocytopenia: Continue Lasix and Aldactone    Management plans discussed with the patient and he is in agreement.  CODE STATUS:  full  TOTAL TIME TAKING CARE OF THIS PATIENT: 29 minutes.   D/w ID on call   POSSIBLE D/C tomorrow, DEPENDING ON CLINICAL CONDITION.   Anne [place],  Lillian Tigges M.D on 05/30/2017 at 7:45 AM  Between 7am to 6pm - Pager - [phone] After 6pm go to [email or web address] - password [email or web address] [place] [facility]  Office  [phone]  CC: Primary care physician; System, Pcp Not In  Note: This dictation was prepared with Dragon dictation along with smaller phrase technology. Any transcriptional errors that result from this process are unintentional.

## 2017-05-30 NOTE — Progress Notes (Signed)
ANTIBIOTIC CONSULT NOTE - INITIAL  Pharmacy Consult for Vancomycin  Indication: bacteremia/ uti  Allergies  Allergen Reactions  . Penicillins Other (See Comments)    Patient Measurements: Height: 5\' 8"  (172.7 cm) Weight: 261 lb (118.4 kg) IBW/kg (Calculated) : 68.4 Adjusted Body Weight:   Vital Signs: Temp: 98.8 F (37.1 C) (06/30 0748) Temp Source: Oral (06/30 0748) BP: 143/69 (06/30 0748) Pulse Rate: 77 (06/30 0748) Intake/Output from previous day: 06/29 0701 - 06/30 0700 In: 600 [P.O.:600] Out: 525 [Urine:525] Intake/Output from this shift: No intake/output data recorded.  Labs:  Recent Labs  05/28/17 0048 05/29/17 0941 05/30/17 0417  WBC 2.5* 6.5 6.0  HGB 11.0* 10.2* 10.0*  PLT 74* 64* 68*  CREATININE 1.16 1.12 1.13   Estimated Creatinine Clearance: 80.4 mL/min (by C-G formula based on SCr of 1.13 mg/dL). No results for input(s): VANCOTROUGH, VANCOPEAK, VANCORANDOM, GENTTROUGH, GENTPEAK, GENTRANDOM, TOBRATROUGH, TOBRAPEAK, TOBRARND, AMIKACINPEAK, AMIKACINTROU, AMIKACIN in the last 72 hours.   Microbiology: Recent Results (from the past 720 hour(s))  Blood Culture (routine x 2)     Status: None (Preliminary result)   Collection Time: 05/28/17 12:48 AM  Result Value Ref Range Status   Specimen [facility]  Final   Special Requests   Final    BOTTLES DRAWN AEROBIC AND ANAEROBIC Blood Culture results may not be optimal due to an excessive volume of blood received in culture bottles   Culture  Setup Time   Final    GRAM POSITIVE COCCI AEROBIC BOTTLE ONLY CRITICAL VALUE NOTED.  VALUE IS CONSISTENT WITH PREVIOUSLY REPORTED AND CALLED VALUE. CONFIRMED BY PMH    Culture GRAM POSITIVE COCCI  Final   Report Status PENDING  Incomplete  Blood Culture (routine x 2)     Status: None (Preliminary result)   Collection Time: 05/28/17 12:48 AM  Result Value Ref Range Status   Specimen Description [facility]OD [facility]  Final   Special Requests   Final    BOTTLES  DRAWN AEROBIC AND ANAEROBIC Blood Culture results may not be optimal due to an excessive volume of blood received in culture bottles   Culture  Setup Time   Final    Organism ID to follow GRAM POSITIVE COCCI CRITICAL RESULT CALLED TO, READ BACK BY AND VERIFIED WITH: MATT MCBANE AT 0015 ON 05/30/17 [facility] ANAEROBIC BOTTLE ONLY CONFIRMEN BY PM[facility]    Culture GRAM POSITIVE COCCI  Final   Report Status PENDING  Incomplete  Urine culture     Status: Abnormal (Preliminary result)   Collection Time: 05/28/17 12:48 AM  Result Value Ref Range Status   Specimen Description URINE, RANDOM  Final   Special Requests NONE  Final   Culture >=100,000 COLONIES/mL ENTEROCOCCUS FAECALIS (A)  Final   Report Status PENDING  Incomplete  Blood Culture ID Panel (Reflexed)     Status: None   Collection Time: 05/28/17 12:48 AM  Result Value Ref Range Status   Enterococcus species NOT DETECTED NOT DETECTED Final   Listeria monocytogenes NOT DETECTED NOT DETECTED Final   Staphylococcus species NOT DETECTED NOT DETECTED Final   Staphylococcus aureus NOT DETECTED NOT DETECTED Final   Streptococcus species NOT DETECTED NOT DETECTED Final   Streptococcus agalactiae NOT DETECTED NOT DETECTED Final   Streptococcus pneumoniae NOT DETECTED NOT DETECTED Final   Streptococcus pyogenes NOT DETECTED NOT DETECTED Final   Acinetobacter baumannii NOT DETECTED NOT DETECTED Final   Enterobacteriaceae species NOT DETECTED NOT DETECTED Final   Enterobacter cloacae complex NOT DETECTED NOT  DETECTED Final   Escherichia coli NOT DETECTED NOT DETECTED Final   Klebsiella oxytoca NOT DETECTED NOT DETECTED Final   Klebsiella pneumoniae NOT DETECTED NOT DETECTED Final   Proteus species NOT DETECTED NOT DETECTED Final   Serratia marcescens NOT DETECTED NOT DETECTED Final   Haemophilus influenzae NOT DETECTED NOT DETECTED Final   Neisseria meningitidis NOT DETECTED NOT DETECTED Final   Pseudomonas aeruginosa NOT DETECTED NOT DETECTED Final    Candida albicans NOT DETECTED NOT DETECTED Final   Candida glabrata NOT DETECTED NOT DETECTED Final   Candida krusei NOT DETECTED NOT DETECTED Final   Candida parapsilosis NOT DETECTED NOT DETECTED Final   Candida tropicalis NOT DETECTED NOT DETECTED Final    Medical History: Past Medical History:  Diagnosis Date  . Cirrhosis of liver (HCC)   . Diabetes (HCC)     Medications:  No prescriptions prior to admission.   Scheduled:  . docusate sodium  100 mg Oral BID  . furosemide  40 mg Oral Daily  . insulin aspart  0-5 Units Subcutaneous QHS  . insulin aspart  0-9 Units Subcutaneous TID WC  . insulin glargine  20 Units Subcutaneous Daily  . living well with diabetes book   Does not apply Once  . pantoprazole (PROTONIX) IV  40 mg Intravenous Q12H  . propranolol  10 mg Oral TID  . spironolactone  25 mg Oral Daily   Infusions:  . vancomycin    . vancomycin     Assessment: Pharmacy consulted to dose and monitor Vancomycin in this 66 year old male. Spoke with MD Mody and she would like to continue Vancomycin for GPC bacteremia (no staph detected @ this time) and E. Faecalis urinary tract infection.  Goal of Therapy:  Vancomycin trough level 15-20 mcg/ml  Plan:  Will give Vancomycin 2 g IV x 1 and will start Vancomycin 1250 mg IV q12 hours. Trough level prior to the 5th dose of regimen.   Juliocesar Blasius D 05/30/2017,7:57 AM

## 2017-05-30 NOTE — Progress Notes (Signed)
PHARMACY - PHYSICIAN COMMUNICATION CRITICAL VALUE ALERT - BLOOD CULTURE IDENTIFICATION (BCID)  Results for orders placed or performed during the hospital encounter of 05/28/17  Blood Culture ID Panel (Reflexed) (Collected: 05/28/2017 12:48 AM)  Result Value Ref Range   Enterococcus species NOT DETECTED NOT DETECTED   Listeria monocytogenes NOT DETECTED NOT DETECTED   Staphylococcus species NOT DETECTED NOT DETECTED   Staphylococcus aureus NOT DETECTED NOT DETECTED   Streptococcus species NOT DETECTED NOT DETECTED   Streptococcus agalactiae NOT DETECTED NOT DETECTED   Streptococcus pneumoniae NOT DETECTED NOT DETECTED   Streptococcus pyogenes NOT DETECTED NOT DETECTED   Acinetobacter baumannii NOT DETECTED NOT DETECTED   Enterobacteriaceae species NOT DETECTED NOT DETECTED   Enterobacter cloacae complex NOT DETECTED NOT DETECTED   Escherichia coli NOT DETECTED NOT DETECTED   Klebsiella oxytoca NOT DETECTED NOT DETECTED   Klebsiella pneumoniae NOT DETECTED NOT DETECTED   Proteus species NOT DETECTED NOT DETECTED   Serratia marcescens NOT DETECTED NOT DETECTED   Haemophilus influenzae NOT DETECTED NOT DETECTED   Neisseria meningitidis NOT DETECTED NOT DETECTED   Pseudomonas aeruginosa NOT DETECTED NOT DETECTED   Candida albicans NOT DETECTED NOT DETECTED   Candida glabrata NOT DETECTED NOT DETECTED   Candida krusei NOT DETECTED NOT DETECTED   Candida parapsilosis NOT DETECTED NOT DETECTED   Candida tropicalis NOT DETECTED NOT DETECTED    Name of physician (or Provider) Contacted: [facility]   Changes to prescribed antibiotics required: n/a  Tynesia Harral S 05/30/2017  12:44 AM

## 2017-05-31 ENCOUNTER — Inpatient Hospital Stay: Admit: 2017-05-31 | Payer: Medicare PPO

## 2017-05-31 LAB — GLUCOSE, CAPILLARY
Glucose-Capillary: 235 mg/dL — ABNORMAL HIGH (ref 65–99)
Glucose-Capillary: 224 mg/dL — ABNORMAL HIGH (ref 65–99)
Glucose-Capillary: 257 mg/dL — ABNORMAL HIGH (ref 65–99)
Glucose-Capillary: 301 mg/dL — ABNORMAL HIGH (ref 65–99)

## 2017-05-31 LAB — BASIC METABOLIC PANEL
ANION GAP: 4 — AB (ref 5–15)
BUN: 18 mg/dL (ref 6–20)
CALCIUM: 8.3 mg/dL — AB (ref 8.9–10.3)
CO2: 26 mmol/L (ref 22–32)
Chloride: 107 mmol/L (ref 101–111)
Creatinine, Ser: 0.96 mg/dL (ref 0.61–1.24)
GFR calc Af Amer: >60 mL/min (ref >60)
Glucose, Bld: 262 mg/dL — ABNORMAL HIGH (ref 65–99)
POTASSIUM: 3.8 mmol/L (ref 3.5–5.1)
SODIUM: 137 mmol/L (ref 135–145)

## 2017-05-31 NOTE — Progress Notes (Signed)
Patient ID: Jesus PeerBruce W Caracci, male   DOB: 06/17/1951, 66 y.o.   MRN: [id number]  GI consult cancelled by Dr. Juliene PinaMody.

## 2017-05-31 NOTE — Progress Notes (Signed)
PHARMACY CONSULT NOTE FOR:  OUTPATIENT  PARENTERAL ANTIBIOTIC THERAPY (OPAT)  Indication: Bacteremia/UTI Regimen: Vancomycin 1250 mg IV q12h End date: 06/09/17  IV antibiotic discharge orders are pended. To discharging provider:  please sign these orders via discharge navigator,  Select New Orders & click on the button choice - Manage This Unsigned Work.     Thank you for allowing pharmacy to be a part of this patient's care.  Cindi CarbonMary M Taylen Wendland, PharmD 05/31/2017, 7:38 AM

## 2017-05-31 NOTE — Progress Notes (Signed)
[facility] [place] NAME: [name]    MR#:  [id number]  DATE OF BIRTH:  [birth year]  SUBJECTIVE:   Patient doing well this morning. No fevers overnight.  REVIEW OF SYSTEMS:    Review of Systems  ConstitutionalNo fevers  NO chills weight loss HENT: Negative for ear pain, nosebleeds, congestion, facial swelling, rhinorrhea, neck pain, neck stiffness and ear discharge.   Respiratory: Negative for cough, shortness of breath, wheezing  Cardiovascular: Negative for chest pain, palpitations and leg swelling.  Gastrointestinal: Negative for heartburn, abdominal pain, vomiting, diarrhea or consitpation Genitourinary: Negative for dysuria, urgency, frequency, hematuria Musculoskeletal: Negative for back pain or joint pain Neurological: Negative for dizziness, seizures, syncope, focal weakness,  numbness and headaches.  Hematological: Does not bruise/bleed easily.  Psychiatric/Behavioral: Negative for hallucinations, confusion, dysphoric mood    Tolerating Diet: yes      DRUG ALLERGIES:   Allergies  Allergen Reactions  . Penicillins Hives and Other (See Comments)    Has patient had a PCN reaction causing immediate rash, facial/tongue/throat swelling, SOB or lightheadedness with hypotension: Yes Has patient had a PCN reaction causing severe rash involving mucus membranes or skin necrosis: No Has patient had a PCN reaction that required hospitalization: No Has patient had a PCN reaction occurring within the last 10 years: No If all of the above answers are "NO", then may proceed with Cephalosporin use.     VITALS:  Blood pressure (!) 157/83, pulse 82, temperature 98.6 F (37 C), temperature source Oral, resp. rate 20, height 5' 8"  (1.727 m), weight 116.9 kg (257 lb 11.2 oz), SpO2 95 %.  PHYSICAL EXAMINATION:  Constitutional: Appears well-developed and well-nourished. No distress. HENT: Normocephalic. Marland Kitchen Oropharynx is clear and moist.   Eyes: Conjunctivae and EOM are normal. PERRLA, no scleral icterus.  Neck: Normal ROM. Neck supple. No JVD. No tracheal deviation. CVS: RRR, S1/S2 +, 3/6 murmur, no gallops, no carotid bruit.  Pulmonary: Effort and breath sounds normal, no stridor, rhonchi, wheezes, rales.  Abdominal: Soft. BS +,  no distension, tenderness, rebound or guarding.  Musculoskeletal: Normal range of motion. 1+ LEE and no tenderness.  Neuro: Alert. CN 2-12 grossly intact. No focal deficits. Skin: Skin is warm and dry. No rash noted. Psychiatric: Normal mood and affect.      LABORATORY PANEL:   CBC  Recent Labs Lab 05/30/17 0417  WBC 6.0  HGB 10.0*  HCT 31.0*  PLT 68*   ------------------------------------------------------------------------------------------------------------------  Chemistries   Recent Labs Lab 05/28/17 0048  05/31/17 0435  NA 139  < > 137  K 3.5  < > 3.8  CL 107  < > 107  CO2 22  < > 26  GLUCOSE 258*  < > 262*  BUN 14  < > 18  CREATININE 1.16  < > 0.96  CALCIUM 9.2  < > 8.3*  AST 49*  --   --   ALT 24  --   --   ALKPHOS 124  --   --   BILITOT 1.2  --   --   < > = values in this interval not displayed. ------------------------------------------------------------------------------------------------------------------  Cardiac Enzymes No results for input(s): TROPONINI in the last 168 hours. ------------------------------------------------------------------------------------------------------------------  RADIOLOGY:  No results found.   ASSESSMENT AND PLAN:    66 year old male with liver cirrhosis who presented with vomiting and fever.  1. Sepsis With bacteremia GPC: Patient met criteria by fever, leukopenia, tachycardia and tachypnea Sepsis is due  to UTI/bacteremia Urine culture growing enterococcus Continue vancomycin PICC line placement Echocardiogram ordered due to bacteremia I called lab and they will not have ID and sensitivities of blood cultures  until tomorrow I spoke with on-call ID physician Repeat blood cultures are negative  2. Vomiting: Patient has denied hematemesis He has not had abdominal pain or vomiting since admission  3. Diabetes: Hemoglobin A1c greater than 9 Continue sliding scale Holding outpatient glyburide and metformin Continue Lantus Continue ADA diet  4. History of liver cirrhosis With chronic thrombocytopenia: Continue Lasix and Aldactone    Management plans discussed with the patient and he is in agreement.  CODE STATUS: full  TOTAL TIME TAKING CARE OF THIS PATIENT: 29 minutes.   D/w ID on call   POSSIBLE D/C tomorrow, DEPENDING ON CLINICAL CONDITION.   Julitza Rickles M.D on 05/31/2017 at 8:31 AM  Between 7am to 6pm - Pager - [phone] After 6pm go to [email or web address] - password [email or web address] [place] [facility]  Office  [phone]  CC: Primary care physician; System, Pcp Not In  Note: This dictation was prepared with Dragon dictation along with smaller phrase technology. Any transcriptional errors that result from this process are unintentional.

## 2017-05-31 NOTE — Progress Notes (Signed)
      INFECTIOUS DISEASE ATTENDING ADDENDUM:   Date: 05/31/2017  Patient name: [name]  Medical record number: [id number]  Date of birth: 04/03/1951    I was called by Dr. Annabell SabalMody eaerlier today re this patient with AMP S Enterococcus in urine but a Gram + organism in 1/2 blood cultures  The organism turned out to be Streptococcus constellatatus which is in the Strep milleri group. It is an inhabitant of the alimentary tract, oral cavity, GI tract and can cause endocarditis.  I am concerned that the Enterococcus in the urine is a "red herring" given my understanding that the patient did not have urinary symptoms so his urine cx more c/w asymptomatic colonization.  I would consider TTE but certainly start with TTE  I would also examine his mouth, consider panorex and abdominal imaging  Would continue the IV vancomycin for now   I will ask Dr. Sampson GoonFitzgerald to formally see him in the am.   Paulette Blanchornelius [place] 05/31/2017, 6:35 PM

## 2017-06-01 ENCOUNTER — Inpatient Hospital Stay (HOSPITAL_COMMUNITY)
Admit: 2017-06-01 | Discharge: 2017-06-01 | Disposition: A | Discharge status: Home / Self Care | Payer: Medicare PPO | Attending: Internal Medicine | Admitting: Internal Medicine

## 2017-06-01 DIAGNOSIS — R7881 Bacteremia: Secondary | ICD-10-CM

## 2017-06-01 LAB — CULTURE, BLOOD (ROUTINE X 2)

## 2017-06-01 LAB — GLUCOSE, CAPILLARY
Glucose-Capillary: 222 mg/dL — ABNORMAL HIGH (ref 65–99)
Glucose-Capillary: 299 mg/dL — ABNORMAL HIGH (ref 65–99)
Glucose-Capillary: 254 mg/dL — ABNORMAL HIGH (ref 65–99)
Glucose-Capillary: 332 mg/dL — ABNORMAL HIGH (ref 65–99)

## 2017-06-01 LAB — BASIC METABOLIC PANEL
ANION GAP: 4 — AB (ref 5–15)
BUN: 18 mg/dL (ref 6–20)
CHLORIDE: 104 mmol/L (ref 101–111)
CO2: 27 mmol/L (ref 22–32)
CREATININE: 0.88 mg/dL (ref 0.61–1.24)
Calcium: 8.2 mg/dL — ABNORMAL LOW (ref 8.9–10.3)
GFR calc non Af Amer: >60 mL/min (ref >60)
Glucose, Bld: 295 mg/dL — ABNORMAL HIGH (ref 65–99)
POTASSIUM: 3.8 mmol/L (ref 3.5–5.1)
Sodium: 135 mmol/L (ref 135–145)

## 2017-06-01 LAB — ECHOCARDIOGRAM COMPLETE
Height: 68 in
Weight: 4110.4 oz

## 2017-06-01 LAB — CBC
HEMATOCRIT: 31 % — AB (ref 40.0–52.0)
HEMOGLOBIN: 10 g/dL — AB (ref 13.0–18.0)
MCH: 27.1 pg (ref 26.0–34.0)
MCHC: 32.4 g/dL (ref 32.0–36.0)
MCV: 83.7 fL (ref 80.0–100.0)
Platelets: 74 10*3/uL — ABNORMAL LOW (ref 150–440)
RBC: 3.71 MIL/uL — AB (ref 4.40–5.90)
RDW: 18.8 % — ABNORMAL HIGH (ref 11.5–14.5)
WBC: 4.2 10*3/uL (ref 3.8–10.6)

## 2017-06-01 LAB — VANCOMYCIN, TROUGH: Vancomycin Tr: 17 ug/mL (ref 15–20)

## 2017-06-01 LAB — AMMONIA: AMMONIA: 56 umol/L — AB (ref 9–35)

## 2017-06-01 MED ORDER — INSULIN GLARGINE 100 UNIT/ML ~~LOC~~ SOLN
26.0000 [IU] | Freq: Every day | SUBCUTANEOUS | Status: DC
  Administered 2017-06-02 – 2017-06-03 (×2): 26 [IU] via SUBCUTANEOUS
  Filled 2017-06-01 (×3): qty 0.26

## 2017-06-01 MED ORDER — SODIUM CHLORIDE 0.9% FLUSH: 10.0000 mL | INTRAVENOUS | Status: DC | PRN

## 2017-06-01 MED ORDER — LACTULOSE 10 GM/15ML PO SOLN
30.0000 g | Freq: Three times a day (TID) | ORAL | Status: DC
  Administered 2017-06-01 – 2017-06-02 (×3): 30 g via ORAL
  Filled 2017-06-01 (×3): qty 60

## 2017-06-01 NOTE — Progress Notes (Signed)
Ammonia level of 56, Sudini notified.

## 2017-06-01 NOTE — Progress Notes (Addendum)
Inpatient Diabetes Program Recommendations  AACE/ADA: New Consensus Statement on Inpatient Glycemic Control (2015)  Target Ranges:  Prepandial:   less than 140 mg/dL      Peak postprandial:   less than 180 mg/dL (1-2 hours)      Critically ill patients:  140 - 180 mg/dL   Lab Results  Component Value Date   GLUCAP 222 (H) 06/01/2017   HGBA1C 9.9 (H) 05/28/2017    Review of Glycemic Control  Results for [name], [name] (MRN [id number]) as of 06/01/2017 08:15  Ref. Range 05/31/2017 07:31 05/31/2017 11:43 05/31/2017 16:24 05/31/2017 23:14 06/01/2017 07:40  Glucose-Capillary Latest Ref Range: 65 - 99 mg/dL 409235 (H) 811224 (H) 914257 (H) 301 (H) 222 (H)    Diabetes history:Diabetes diagnosis x 10 years as reported by patient and wifge Outpatient Diabetes medications: Glucotrol 5mg  qday, Metformin 1000mg  bid  Current orders for Inpatient glycemic control: Novolog 0-9 units tid, Novolog 0-5 units qhs, Lantus 20 units qday  Inpatient Diabetes Program Recommendations:   [facility] appointment is scheduled for the end of the month.   Consider increasing Lantus to 29 units qday starting today (0.25 units/kg).    Consider adding 5 units tid with meals (hold if patient eats 50%)   Susette RacerJulie Luis Nickles, RN, [place], [place]HA, CDE Diabetes Coordinator Inpatient Diabetes Program  [phone] (Team Pager) [phone] [facility] Office) 06/01/2017 8:26 AM

## 2017-06-01 NOTE — Evaluation (Signed)
Physical Therapy Evaluation Patient Details Name: [name] MRN: [id number] DOB: 21-Jul-1951 Today's Date: 06/01/2017   History of Present Illness  Jesus [name] is a 66yo white male who comes to [name] on 6/28 after episodes of vomitting, questionable hematemeis. Pt is tachycardic, tachypnic, and febrile in the ED, admitted for sepsis. PMH: cirrhosis, Rt RCR, B TKA. Pt sustained a fall back in february and has been receiving OPPT at [name] for back pain. At baseline he is accessess the community regularly, alternating between a rollator and a motorized WC.   Clinical Impression  Pt admitted with above diagnosis. Pt currently with functional limitations due to the deficits listed below (see "PT Problem List"). Upon entry, the patient is received up in chair, breakfast completed, nausea absent, pain constant as PTA. The pt is awake and agreeable to participate. No acute distress noted at this time. Functional mobility assessment demonstrates moderate weakness and guarding bradykinesia due to chronic back pain, however the patient is mobilizing at his baseline level of function, and AMB safe household distances without significant effort.  The patient is at high risk for falls as evidence by gait speed <1.32ms. Pt presenting with impairment, limitation, and falls risk as outlined below, but at functional baseline, and safe for household AMB at baseline. Pt has all recommended/needed DME already. No additional skilled PT services needed in this setting. PT recommending continuation of Outpatient services upon DC. PT signing off.      Follow Up Recommendations Outpatient PT (continue with [name] [place] [facility]. )    Equipment Recommendations  None recommended by PT    Recommendations for Other Services       Precautions / Restrictions Precautions Precautions: None Restrictions Weight Bearing Restrictions: No      Mobility  Bed Mobility               General bed  mobility comments: received up in chair  Transfers Overall transfer level: Needs assistance Equipment used: Rolling walker (2 wheeled) Transfers: Sit to/from Stand Sit to Stand: Supervision         General transfer comment: additional time required, some weakness.   Ambulation/Gait Ambulation/Gait assistance: Supervision Ambulation Distance (Feet): 180 Feet Assistive device: Rolling walker (2 wheeled) Gait Pattern/deviations: Step-through pattern Gait velocity: 0.217m    General Gait Details: slow step through gait, no blatant assymetry; pt reports effort, speed, and stability to be at baseline.   Stairs            Wheelchair Mobility    Modified Rankin (Stroke Patients Only)       Balance Overall balance assessment: Needs assistance;No apparent balance deficits (not formally assessed) (intermittent limitation at hoem due to fluctuating pain in back.)                                           Pertinent Vitals/Pain Pain Assessment: 0-10 Pain Score: 8  Pain Location: low back, hurting since fall in February Pain Descriptors / Indicators: Aching Pain Intervention(s): Limited activity within patient's tolerance;Monitored during session    [facility] [place]s to be discharged to:: Private residence Living Arrangements: Spouse/significant other (Spouse and Brother ) Available Help at Discharge: Family Type of Home: House Home Access: Stairs to enter Entrance Stairs-Rails: Can reach both Entrance Stairs-Number of Steps: 5 [place] level Home Equipment: WaEnvironmental consultant 2 wheels;Walker -  4 wheels;Cane - single point;Bedside commode;Electric scooter      Prior Function Level of Independence: Independent with assistive device(s)               Hand Dominance        Extremity/Trunk Assessment   Upper Extremity Assessment Upper Extremity Assessment: Overall WFL for tasks assessed    Lower Extremity Assessment Lower  Extremity Assessment: Overall WFL for tasks assessed    Cervical / Trunk Assessment Cervical / Trunk Assessment: Normal  Communication   Communication: No difficulties  Cognition Arousal/Alertness: Awake/alert Behavior During Therapy: WFL for tasks assessed/performed Overall Cognitive Status: Within Functional Limits for tasks assessed                                        General Comments      Exercises     Assessment/Plan    PT Assessment Patent does not need any further PT services;All further PT needs can be met in the next venue of care  PT Problem List Decreased mobility       PT Treatment Interventions      PT Goals (Current goals can be found in the Care Plan section)  Acute Rehab PT Goals Patient Stated Goal: regain strength in mobility  PT Goal Formulation: All assessment and education complete, DC therapy    Frequency     Barriers to discharge        Co-evaluation               AM-PAC PT "6 Clicks" Daily Activity  Outcome Measure Difficulty turning over in bed (including adjusting bedclothes, sheets and blankets)?: A Lot Difficulty moving from lying on back to sitting on the side of the bed? : A Lot Difficulty sitting down on and standing up from a chair with arms (e.g., wheelchair, bedside commode, etc,.)?: A Lot Help needed moving to and from a bed to chair (including a wheelchair)?: A Little Help needed walking in hospital room?: A Little Help needed climbing 3-5 steps with a railing? : A Lot 6 Click Score: 14    End of Session Equipment Utilized During Treatment: Gait belt Activity Tolerance: Patient tolerated treatment well;No increased pain;Patient limited by fatigue Patient left: in chair;with call bell/phone within reach;with chair alarm set   PT Visit Diagnosis: Difficulty in walking, not elsewhere classified (R26.2)    Time: 0277-4128 PT Time Calculation (min) (ACUTE ONLY): 21 min   Charges:   PT  Evaluation $PT Eval Low Complexity: 1 Procedure PT Treatments $Therapeutic Activity: 8-22 mins   PT G Codes:        9:37 AM, 06-12-17 [name], PT, DPT Physical Therapist - [place] [phone] ([place])  [phone] (mobile)   [name] C June 12, 2017, 9:31 AM

## 2017-06-01 NOTE — Progress Notes (Signed)
*  PRELIMINARY RESULTS* Echocardiogram 2D Echocardiogram has been performed.  [name], [name] 06/01/2017, 3:10 PM

## 2017-06-01 NOTE — Progress Notes (Signed)
[facility] [facility] [place] [facility]   PATIENT NAME: Renelda MomBruce Vey    MR#:  [id number]  DATE OF BIRTH:  07/16/1951  SUBJECTIVE:   Worked with physical therapy. Sitting up in a chair. Poor sleep at night and now feels tired. No dysuria or urinary frequency. No vomiting. No abdominal pain.  REVIEW OF SYSTEMS:    Review of Systems  ConstitutionalNo fevers  No chills weight loss HENT: Negative for ear pain, nosebleeds, congestion, facial swelling, rhinorrhea, neck pain, neck stiffness and ear discharge.   Respiratory: Negative for cough, shortness of breath, wheezing  Cardiovascular: Negative for chest pain, palpitations and leg swelling.  Gastrointestinal: Negative for heartburn, abdominal pain, vomiting, diarrhea or consitpation Genitourinary: Negative for dysuria, urgency, frequency, hematuria Musculoskeletal: Negative for back pain or joint pain Neurological: Negative for dizziness, seizures, syncope, focal weakness,  numbness and headaches.  Hematological: Does not bruise/bleed easily.  Psychiatric/Behavioral: Negative for hallucinations, confusion, dysphoric mood  DRUG ALLERGIES:   Allergies  Allergen Reactions  . Penicillins Hives and Other (See Comments)    Has patient had a PCN reaction causing immediate rash, facial/tongue/throat swelling, SOB or lightheadedness with hypotension: Yes Has patient had a PCN reaction causing severe rash involving mucus membranes or skin necrosis: No Has patient had a PCN reaction that required hospitalization: No Has patient had a PCN reaction occurring within the last 10 years: No If all of the above answers are "NO", then may proceed with Cephalosporin use.     VITALS:  Blood pressure 116/77, pulse 72, temperature 97.5 F (36.4 C), resp. rate 16, height 5\' 8"  (1.727 m), weight 116.5 kg (256 lb 14.4 oz), SpO2 97 %.  PHYSICAL EXAMINATION:  Constitutional: Appears well-developed and well-nourished. No distress. HENT:  Normocephalic. Marland Kitchen. Oropharynx is clear and moist.  Eyes: Conjunctivae and EOM are normal. PERRLA, no scleral icterus.  Neck: Normal ROM. Neck supple. No JVD. No tracheal deviation. CVS: RRR, S1/S2 +, 3/6 murmur, no gallops, no carotid bruit.  Pulmonary: Effort and breath sounds normal, no stridor, rhonchi, wheezes, rales.  Abdominal: Soft. BS +,  no distension, tenderness, rebound or guarding.  Musculoskeletal: Normal range of motion. 1+ LEE and no tenderness.  Neuro: Alert. CN 2-12 grossly intact. No focal deficits. Skin: Skin is warm and dry. No rash noted. Psychiatric: Normal mood and affect.  Right upper extremity PICC line in place  LABORATORY PANEL:   CBC  Recent Labs Lab 06/01/17 0420  WBC 4.2  HGB 10.0*  HCT 31.0*  PLT 74*   ------------------------------------------------------------------------------------------------------------------  Chemistries   Recent Labs Lab 05/28/17 0048  06/01/17 0420  NA 139  < > 135  K 3.5  < > 3.8  CL 107  < > 104  CO2 22  < > 27  GLUCOSE 258*  < > 295*  BUN 14  < > 18  CREATININE 1.16  < > 0.88  CALCIUM 9.2  < > 8.2*  AST 49*  --   --   ALT 24  --   --   ALKPHOS 124  --   --   BILITOT 1.2  --   --   < > = values in this interval not displayed. ------------------------------------------------------------------------------------------------------------------  Cardiac Enzymes No results for input(s): TROPONINI in the last 168 hours. ------------------------------------------------------------------------------------------------------------------  RADIOLOGY:  No results found.   ASSESSMENT AND PLAN:    66 year old male with liver cirrhosis who presented with vomiting and fever.  * Streptococcus constellatatus bacteremia with sepsis present on  admission Continue vancomycin PICC line placed on 05/31/2017 Echocardiogram ordered due to concern for endocarditis Repeat blood cultures negative. Consult infectious  disease.  * Enterococcus UTI. No urinary symptoms. On vancomycin.  * Vomiting: Patient has denied hematemesis He has not had abdominal pain or vomiting since admission Resolved. Hemoglobin stable.  * Diabetes mellitus type 2. Hold glyburide and metformin from home. Started on Lantus. We will increase dose to 26 units today. Continue sliding scale insulin.  * History of liver cirrhosis With chronic thrombocytopenia Continue Lasix and Aldactone Check ammonia level as patient is drowsy May have early hepatic encephalopathy.  Management plans discussed with the patient and he is in agreement.  CODE STATUS: Full  TOTAL TIME TAKING CARE OF THIS PATIENT: 35 minutes.   POSSIBLE D/C 1-2  DEPENDING ON CLINICAL CONDITION.  Milagros Loll R M.D on 06/01/2017 at 12:20 PM  Between 7am to 6pm - Pager - [phone]  After 6pm go to [email or web address] - password [email or web address]  [facility] [place] [facility]  Office  [phone]  CC: Primary care physician; System, Pcp Not In  Note: This dictation was prepared with Dragon dictation along with smaller phrase technology. Any transcriptional errors that result from this process are unintentional.

## 2017-06-01 NOTE — Consult Note (Signed)
[facility] Infectious Disease     Reason for Consult: Strep bacteremia   Referring Physician: Dr Darvin Neighbours, Chauncey Cruel Date of Admission:  05/28/2017   Active Problems:   Sepsis [facility])   HPI: Jesus [name] is a 65 y.o. male admitted with vomiting and fevers.  He has hx cirrhosis.  On admit temp 103, wbc 2.5.  [place] turned + for Strep constellatus in 2/2 bottles. Started on IV abx and defervesced, WBC normalized. FU BCX neg.  UCX enterococcus but UA only 6-30 wbc and no sxs. CXR neg. He reports relatively acute onset of illness and denies any recent dental GI or GU infections. No recent dental work but dentition is somewhat poor. He has hx PCN allergy.    Past Medical History:  Diagnosis Date  . Cirrhosis of liver ([place])   . Diabetes [facility])    Past Surgical History:  Procedure Laterality Date  . ROTATOR CUFF REPAIR Right   . TOTAL KNEE ARTHROPLASTY Bilateral    Social History  Substance Use Topics  . Smoking status: Never Smoker  . Smokeless tobacco: Never Used  . Alcohol use No   Family History  Problem Relation Age of Onset  . Diabetes Mellitus II Father     Allergies:  Allergies  Allergen Reactions  . Penicillins Hives and Other (See Comments)    Has patient had a PCN reaction causing immediate rash, facial/tongue/throat swelling, SOB or lightheadedness with hypotension: Yes Has patient had a PCN reaction causing severe rash involving mucus membranes or skin necrosis: No Has patient had a PCN reaction that required hospitalization: No Has patient had a PCN reaction occurring within the last 10 years: No If all of the above answers are "NO", then may proceed with Cephalosporin use.     Current antibiotics: Antibiotics Given (last 72 hours)    Date/Time Action Medication Dose Rate   05/29/17 1711 New Bag/Given   cefTRIAXone (ROCEPHIN) 1 g in dextrose 5 % 50 mL IVPB 1 g 100 mL/hr   05/30/17 0936 New Bag/Given   vancomycin (VANCOCIN) 2,000 mg in sodium chloride 0.9 % 500 mL IVPB  2,000 mg 250 mL/hr   05/30/17 2200 New Bag/Given   vancomycin (VANCOCIN) 1,250 mg in sodium chloride 0.9 % 250 mL IVPB 1,250 mg 166.7 mL/hr   05/31/17 1010 New Bag/Given   vancomycin (VANCOCIN) 1,250 mg in sodium chloride 0.9 % 250 mL IVPB 1,250 mg 166.7 mL/hr   05/31/17 2022 New Bag/Given   vancomycin (VANCOCIN) 1,250 mg in sodium chloride 0.9 % 250 mL IVPB 1,250 mg 166.7 mL/hr   06/01/17 1048 New Bag/Given   vancomycin (VANCOCIN) 1,250 mg in sodium chloride 0.9 % 250 mL IVPB 1,250 mg 166.7 mL/hr      MEDICATIONS: . docusate sodium  100 mg Oral BID  . furosemide  40 mg Oral Daily  . insulin aspart  0-5 Units Subcutaneous QHS  . insulin aspart  0-9 Units Subcutaneous TID WC  . [START ON 06/02/2017] insulin glargine  26 Units Subcutaneous Daily  . lactulose  30 g Oral TID  . pantoprazole (PROTONIX) IV  40 mg Intravenous Q12H  . propranolol  10 mg Oral TID  . spironolactone  25 mg Oral Daily    Review of Systems - 11 systems reviewed and negative per HPI   OBJECTIVE: Temp:  [97.5 F (36.4 C)] 97.5 F (36.4 C) (07/01 2300) Pulse Rate:  [68-72] 72 (07/01 2300) Resp:  [16] 16 (07/01 2300) BP: (116-122)/(77-80) 116/77 (07/01 2300) SpO2:  [  97 %] 97 % (07/01 2300) Weight:  [116.5 kg (256 lb 14.4 oz)] 116.5 kg (256 lb 14.4 oz) (07/02 0417) Physical Exam  Constitutional: He is oriented to person, place, and time. Obese, chronically ill appearing.  HENT: anicteric Mouth/Throat: Oropharynx is clear and moist. No oropharyngeal exudate. Missing several teeth but no obvious abscess or infection  Cardiovascular: Normal rate, regular rhythm and normal heart sounds. 2/6 sm Pulmonary/Chest: Effort normal and breath sounds normal. No respiratory distress. He has no wheezes.  Abdominal: Soft. Obese, very large pannus. Bowel sounds are normal. He exhibits no distension. There is no tenderness.  Lymphadenopathy: He has no cervical adenopathy.  Neurological: He is alert and oriented to person,  place, and time.  Skin: Skin is warm and dry.Bil LE with changes of  Lipodermatosclerosis.  Psychiatric: He has a normal mood and affect. His behavior is normal.  Ext - Bil knee replacement scars wnl, good rom  LABS: Results for orders placed or performed during the hospital encounter of 05/28/17 (from the past 48 hour(s))  Glucose, capillary     Status: Abnormal   Collection Time: 05/30/17  4:01 PM  Result Value Ref Range   Glucose-Capillary 348 (H) 65 - 99 mg/dL   Comment 1 Notify RN    Comment 2 Document in Chart   Glucose, capillary     Status: Abnormal   Collection Time: 05/30/17 10:18 PM  Result Value Ref Range   Glucose-Capillary 361 (H) 65 - 99 mg/dL  Basic metabolic panel     Status: Abnormal   Collection Time: 05/31/17  4:35 AM  Result Value Ref Range   Sodium 137 135 - 145 mmol/L   Potassium 3.8 3.5 - 5.1 mmol/L   Chloride 107 101 - 111 mmol/L   CO2 26 22 - 32 mmol/L   Glucose, Bld 262 (H) 65 - 99 mg/dL   BUN 18 6 - 20 mg/dL   Creatinine, Ser 0.96 0.61 - 1.24 mg/dL   Calcium 8.3 (L) 8.9 - 10.3 mg/dL   GFR calc non Af Amer >60 >60 mL/min   GFR calc Af Amer >60 >60 mL/min    Comment: (NOTE) The eGFR has been calculated using the CKD EPI equation. This calculation has not been validated in all clinical situations. eGFR's persistently <60 mL/min signify possible Chronic Kidney Disease.    Anion gap 4 (L) 5 - 15  Glucose, capillary     Status: Abnormal   Collection Time: 05/31/17  7:31 AM  Result Value Ref Range   Glucose-Capillary 235 (H) 65 - 99 mg/dL   Comment 1 Notify RN    Comment 2 Document in Chart   Glucose, capillary     Status: Abnormal   Collection Time: 05/31/17 11:43 AM  Result Value Ref Range   Glucose-Capillary 224 (H) 65 - 99 mg/dL   Comment 1 Notify RN    Comment 2 Document in Chart   Glucose, capillary     Status: Abnormal   Collection Time: 05/31/17  4:24 PM  Result Value Ref Range   Glucose-Capillary 257 (H) 65 - 99 mg/dL   Comment 1  Notify RN    Comment 2 Document in Chart   Glucose, capillary     Status: Abnormal   Collection Time: 05/31/17 11:14 PM  Result Value Ref Range   Glucose-Capillary 301 (H) 65 - 99 mg/dL  CBC     Status: Abnormal   Collection Time: 06/01/17  4:20 AM  Result Value Ref Range  WBC 4.2 3.8 - 10.6 K/uL   RBC 3.71 (L) 4.40 - 5.90 MIL/uL   Hemoglobin 10.0 (L) 13.0 - 18.0 g/dL   HCT 31.0 (L) 40.0 - 52.0 %   MCV 83.7 80.0 - 100.0 fL   MCH 27.1 26.0 - 34.0 pg   MCHC 32.4 32.0 - 36.0 g/dL   RDW 18.8 (H) 11.5 - 14.5 %   Platelets 74 (L) 150 - 440 K/uL  Basic metabolic panel     Status: Abnormal   Collection Time: 06/01/17  4:20 AM  Result Value Ref Range   Sodium 135 135 - 145 mmol/L   Potassium 3.8 3.5 - 5.1 mmol/L   Chloride 104 101 - 111 mmol/L   CO2 27 22 - 32 mmol/L   Glucose, Bld 295 (H) 65 - 99 mg/dL   BUN 18 6 - 20 mg/dL   Creatinine, Ser 0.88 0.61 - 1.24 mg/dL   Calcium 8.2 (L) 8.9 - 10.3 mg/dL   GFR calc non Af Amer >60 >60 mL/min   GFR calc Af Amer >60 >60 mL/min    Comment: (NOTE) The eGFR has been calculated using the CKD EPI equation. This calculation has not been validated in all clinical situations. eGFR's persistently <60 mL/min signify possible Chronic Kidney Disease.    Anion gap 4 (L) 5 - 15  Glucose, capillary     Status: Abnormal   Collection Time: 06/01/17  7:40 AM  Result Value Ref Range   Glucose-Capillary 222 (H) 65 - 99 mg/dL  Glucose, capillary     Status: Abnormal   Collection Time: 06/01/17 11:09 AM  Result Value Ref Range   Glucose-Capillary 299 (H) 65 - 99 mg/dL  Ammonia     Status: Abnormal   Collection Time: 06/01/17  1:30 PM  Result Value Ref Range   Ammonia 56 (H) 9 - 35 umol/L   No components found for: ESR, C REACTIVE PROTEIN MICRO: Recent Results (from the past 720 hour(s))  Blood Culture (routine x 2)     Status: Abnormal   Collection Time: 05/28/17 12:48 AM  Result Value Ref Range Status   Specimen Description BLOOD RT [facility]  Final    Special Requests   Final    BOTTLES DRAWN AEROBIC AND ANAEROBIC Blood Culture results may not be optimal due to an excessive volume of blood received in culture bottles   Culture  Setup Time   Final    GRAM POSITIVE COCCI AEROBIC BOTTLE ONLY CRITICAL VALUE NOTED.  VALUE IS CONSISTENT WITH PREVIOUSLY REPORTED AND CALLED VALUE. CONFIRMED BY [facility]    Culture (A)  Final    STREPTOCOCCUS CONSTELLATUS SUSCEPTIBILITIES PERFORMED ON PREVIOUS CULTURE WITHIN THE LAST 5 DAYS. Performed at [facility], [place] [address], [place], [place] [address]    Report Status 06/01/2017 FINAL  Final  Blood Culture (routine x 2)     Status: Abnormal   Collection Time: 05/28/17 12:48 AM  Result Value Ref Range Status   Specimen Description BLOOD LT [facility]  Final   Special Requests   Final    BOTTLES DRAWN AEROBIC AND ANAEROBIC Blood Culture results may not be optimal due to an excessive volume of blood received in culture bottles   Culture  Setup Time   Final    GRAM POSITIVE COCCI CRITICAL RESULT CALLED TO, READ BACK BY AND VERIFIED WITH: MATT MCBANE AT 0015 ON 05/30/17 RWW ANAEROBIC BOTTLE ONLY CONFIRMEN BY [facility] Performed at [facility], [place],  [place] [address]    Culture STREPTOCOCCUS CONSTELLATUS (A)  Final   Report Status 06/01/2017 FINAL  Final   Organism ID, Bacteria STREPTOCOCCUS CONSTELLATUS  Final      Susceptibility   Streptococcus constellatus - MIC*    PENICILLIN INTERMEDIATE Intermediate     CEFTRIAXONE 1 SENSITIVE Sensitive     ERYTHROMYCIN <=0.12 SENSITIVE Sensitive     LEVOFLOXACIN <=0.25 SENSITIVE Sensitive     VANCOMYCIN 0.5 SENSITIVE Sensitive     * STREPTOCOCCUS CONSTELLATUS  Urine culture     Status: Abnormal   Collection Time: 05/28/17 12:48 AM  Result Value Ref Range Status   Specimen Description URINE, RANDOM  Final   Special Requests NONE  Final   Culture >=100,000 COLONIES/mL ENTEROCOCCUS FAECALIS (A)  Final   Report Status 05/30/2017 FINAL  Final    Organism ID, Bacteria ENTEROCOCCUS FAECALIS (A)  Final      Susceptibility   Enterococcus faecalis - MIC*    AMPICILLIN <=2 SENSITIVE Sensitive     LEVOFLOXACIN >=8 RESISTANT Resistant     NITROFURANTOIN 64 INTERMEDIATE Intermediate     VANCOMYCIN 1 SENSITIVE Sensitive     * >=100,000 COLONIES/mL ENTEROCOCCUS FAECALIS  Blood Culture ID Panel (Reflexed)     Status: None   Collection Time: 05/28/17 12:48 AM  Result Value Ref Range Status   Enterococcus species NOT DETECTED NOT DETECTED Final   Listeria monocytogenes NOT DETECTED NOT DETECTED Final   Staphylococcus species NOT DETECTED NOT DETECTED Final   Staphylococcus aureus NOT DETECTED NOT DETECTED Final   Streptococcus species NOT DETECTED NOT DETECTED Final   Streptococcus agalactiae NOT DETECTED NOT DETECTED Final   Streptococcus pneumoniae NOT DETECTED NOT DETECTED Final   Streptococcus pyogenes NOT DETECTED NOT DETECTED Final   Acinetobacter baumannii NOT DETECTED NOT DETECTED Final   Enterobacteriaceae species NOT DETECTED NOT DETECTED Final   Enterobacter cloacae complex NOT DETECTED NOT DETECTED Final   Escherichia coli NOT DETECTED NOT DETECTED Final   Klebsiella oxytoca NOT DETECTED NOT DETECTED Final   Klebsiella pneumoniae NOT DETECTED NOT DETECTED Final   Proteus species NOT DETECTED NOT DETECTED Final   Serratia marcescens NOT DETECTED NOT DETECTED Final   Haemophilus influenzae NOT DETECTED NOT DETECTED Final   Neisseria meningitidis NOT DETECTED NOT DETECTED Final   Pseudomonas aeruginosa NOT DETECTED NOT DETECTED Final   Candida albicans NOT DETECTED NOT DETECTED Final   Candida glabrata NOT DETECTED NOT DETECTED Final   Candida krusei NOT DETECTED NOT DETECTED Final   Candida parapsilosis NOT DETECTED NOT DETECTED Final   Candida tropicalis NOT DETECTED NOT DETECTED Final  Culture, blood (Routine X 2) w Reflex to ID Panel     Status: None (Preliminary result)   Collection Time: 05/30/17  7:31 AM  Result Value  Ref Range Status   Specimen Description BLOOD BLOOD RIGHT ARM  Final   Special Requests   Final    BOTTLES DRAWN AEROBIC AND ANAEROBIC Blood Culture adequate volume   Culture NO GROWTH 2 DAYS  Final   Report Status PENDING  Incomplete  Culture, blood (Routine X 2) w Reflex to ID Panel     Status: None (Preliminary result)   Collection Time: 05/30/17  7:32 AM  Result Value Ref Range Status   Specimen Description BLOOD BLOOD LEFT HAND  Final   Special Requests   Final    BOTTLES DRAWN AEROBIC AND ANAEROBIC Blood Culture adequate volume   Culture NO GROWTH 2 DAYS  Final   Report Status PENDING  Incomplete    IMAGING: Dg Chest Port 1 View  Result Date: 05/28/2017 CLINICAL DATA:  Sepsis nausea vomiting and fever EXAM: PORTABLE CHEST 1 VIEW COMPARISON:  10/22/2007 FINDINGS: Patient is rotated. There is cardiomegaly, likely exaggerated by patient rotation. No focal consolidation or large pleural effusion is seen. No pneumothorax. IMPRESSION: Cardiomegaly.  No edema or infiltrate. Electronically Signed   By: Donavan Foil M.D.   On: 05/28/2017 01:44    Assessment:   Jesus [name] is a 66 y.o. male admitted with fevers and vomiting in setting of cirrhosis and found to have Strep constellatus bacteremia in 2/2 blood cultures. He does have relatively poor dentition so this is likely source and he is at risk of SBE as this is a viridans strep. His echo is negative but suboptimal. Does have vavular dz with mod AS, calcified MV and mild MS. . He also has bil TKR but no evidence these have been seeded and no other evidence of metastatic infection.  FU bcx 6/30 ngtd  Recommendations At this point I think he can be changed to IV ceftriaxone (he reports hives to pcn in past but no anaphylaxis or more serious reaction). - Will change in AM He would benefit from A TEE but his plts are low at 74. Will consult cardiology in AM (Dr Fletcher Anon) to see if safe to do TEE.  Will base duration of IV abx on TEE results.   Thank you very much for allowing me to participate in the care of this patient. Please call with questions.   Cheral Marker. Ola Spurr, MD

## 2017-06-01 NOTE — Care Management Note (Signed)
Case Management Note  Patient Details  Name: Jesus [name] MRN: [id number] Date of Birth: 10/27/1951  Subjective/Objective:   Seen by I & D. Has a PICC. May be discharged home with IV anttibiotics. Awaiting recommendations.                 Action/Plan:   Expected Discharge Date:                  Expected Discharge Plan:     In-House Referral:     Discharge planning Services  CM Consult  Post Acute Care Choice:    Choice offered to:     DME Arranged:    DME Agency:     HH Arranged:    HH Agency:     Status of Service:  In process, will continue to follow  If discussed at Long Length of Stay Meetings, dates discussed:    Additional Comments:  Marily MemosLisa M Aaden Buckman, RN 06/01/2017, 4:14 PM

## 2017-06-02 LAB — GLUCOSE, CAPILLARY
Glucose-Capillary: 194 mg/dL — ABNORMAL HIGH (ref 65–99)
Glucose-Capillary: 257 mg/dL — ABNORMAL HIGH (ref 65–99)
Glucose-Capillary: 286 mg/dL — ABNORMAL HIGH (ref 65–99)
Glucose-Capillary: 398 mg/dL — ABNORMAL HIGH (ref 65–99)

## 2017-06-02 MED ORDER — LACTULOSE 10 GM/15ML PO SOLN
30.0000 g | Freq: Three times a day (TID) | ORAL | Status: AC
  Administered 2017-06-02 (×2): 30 g via ORAL
  Filled 2017-06-02 (×2): qty 60

## 2017-06-02 MED ORDER — LACTULOSE 10 GM/15ML PO SOLN
30.0000 g | Freq: Every day | ORAL | Status: DC
  Administered 2017-06-03: 30 g via ORAL
  Filled 2017-06-02: qty 60

## 2017-06-02 MED ORDER — CEFTRIAXONE SODIUM 2 G IJ SOLR
2.0000 g | INTRAMUSCULAR | Status: DC
  Administered 2017-06-02 – 2017-06-03 (×2): 2 g via INTRAVENOUS
  Filled 2017-06-02 (×3): qty 2

## 2017-06-02 MED ORDER — INSULIN ASPART 100 UNIT/ML ~~LOC~~ SOLN
5.0000 [IU] | Freq: Three times a day (TID) | SUBCUTANEOUS | Status: DC
  Administered 2017-06-02 – 2017-06-03 (×4): 5 [IU] via SUBCUTANEOUS
  Filled 2017-06-02 (×4): qty 1

## 2017-06-02 MED ORDER — PANTOPRAZOLE SODIUM 40 MG PO TBEC
40.0000 mg | DELAYED_RELEASE_TABLET | Freq: Two times a day (BID) | ORAL | Status: DC
  Administered 2017-06-02 – 2017-06-03 (×4): 40 mg via ORAL
  Filled 2017-06-02 (×4): qty 1

## 2017-06-02 NOTE — Progress Notes (Signed)
Inpatient Diabetes Program Recommendations  AACE/ADA: New Consensus Statement on Inpatient Glycemic Control (2015)  Target Ranges:  Prepandial:   less than 140 mg/dL      Peak postprandial:   less than 180 mg/dL (1-2 hours)      Critically ill patients:  140 - 180 mg/dL   Lab Results  Component Value Date   GLUCAP 194 (H) 06/02/2017   HGBA1C 9.9 (H) 05/28/2017   Results for Deirdre PeerROUSE, Xylan W (MRN [id number]) as of 06/02/2017 09:39  Ref. Range 06/01/2017 07:40 06/01/2017 11:09 06/01/2017 16:54 06/01/2017 21:17 06/02/2017 08:14  Glucose-Capillary Latest Ref Range: 65 - 99 mg/dL 409222 (H) 811299 (H) 914254 (H) 332 (H) 194 (H)    Diabetes history:Diabetes diagnosis x 10 years as reported by patient and wife Outpatient Diabetes medications: Glucotrol 5mg  qday, Metformin 1000mg  bid  Current orders for Inpatient glycemic control: Novolog 0-9 units tid, Novolog 0-5 units qhs, Lantus 26 units qday- started today  Inpatient Diabetes Program Recommendations:   Consider adding 5 units tid with meals (hold if patient eats 50%)- post prandial blood sugars elevated despite correction insulin.   [facility] is scheduled for the end of the month for diabetes education. Spoke with patient and wife yesterday- he did not know his last A1C and admits to only checking his blood sugars sporadically and only first thing in the morning.  He admits to only eating supper most days and although he has not recently had hypoglycemia, he has a fair amount of it in the past (likely because he doesn't eat breakfast or lunch and takes Glucotrol.  Based on his A1C and his history of hypoglycemia- patient may do better with low dose Lantus at discharge and d/c Glucotrol.   Susette RacerJulie Gregorey Nabor, RN, BA, MHA, CDE Diabetes Coordinator Inpatient Diabetes Program  [phone] (Team Pager) [phone] [facility] Office) 06/02/2017 9:41 AM

## 2017-06-02 NOTE — Progress Notes (Signed)
[facility] [place] [facility]   PATIENT NAME: Jesus [name]    MR#:  [id number]  DATE OF BIRTH:  07/24/1951  SUBJECTIVE:   More awake.  No BMs yet with lactulose.  REVIEW OF SYSTEMS:    Review of Systems  ConstitutionalNo fevers  No chills weight loss HENT: Negative for ear pain, nosebleeds, congestion, facial swelling, rhinorrhea, neck pain, neck stiffness and ear discharge.   Respiratory: Negative for cough, shortness of breath, wheezing  Cardiovascular: Negative for chest pain, palpitations and leg swelling.  Gastrointestinal: Negative for heartburn, abdominal pain, vomiting, diarrhea or consitpation Genitourinary: Negative for dysuria, urgency, frequency, hematuria Musculoskeletal: Negative for back pain or joint pain Neurological: Negative for dizziness, seizures, syncope, focal weakness,  numbness and headaches.  Hematological: Does not bruise/bleed easily.  Psychiatric/Behavioral: Negative for hallucinations, confusion, dysphoric mood  DRUG ALLERGIES:   Allergies  Allergen Reactions  . Penicillins Hives and Other (See Comments)    Has patient had a PCN reaction causing immediate rash, facial/tongue/throat swelling, SOB or lightheadedness with hypotension: Yes Has patient had a PCN reaction causing severe rash involving mucus membranes or skin necrosis: No Has patient had a PCN reaction that required hospitalization: No Has patient had a PCN reaction occurring within the last 10 years: No If all of the above answers are "NO", then may proceed with Cephalosporin use.     VITALS:  Blood pressure (!) 152/76, pulse 69, temperature 98 F (36.7 C), temperature source Oral, resp. rate 20, height 5\' 8"  (1.727 m), weight 116.5 kg (256 lb 14.5 oz), SpO2 98 %.  PHYSICAL EXAMINATION:  Constitutional: Appears well-developed and well-nourished. No distress. HENT: Normocephalic. Marland Kitchen. Oropharynx is clear and moist.  Eyes: Conjunctivae and EOM are normal. PERRLA,  no scleral icterus.  Neck: Normal ROM. Neck supple. No JVD. No tracheal deviation. CVS: RRR, S1/S2 +, 3/6 murmur, no gallops, no carotid bruit.  Pulmonary: Effort and breath sounds normal, no stridor, rhonchi, wheezes, rales.  Abdominal: Soft. BS +,  no distension, tenderness, rebound or guarding.  Musculoskeletal: Normal range of motion. 1+ LEE and no tenderness.  Neuro: Alert. CN 2-12 grossly intact. No focal deficits. Skin: Skin is warm and dry. No rash noted. Psychiatric: Normal mood and affect.  Right upper extremity PICC line in place  LABORATORY PANEL:   CBC  Recent Labs Lab 06/01/17 0420  WBC 4.2  HGB 10.0*  HCT 31.0*  PLT 74*   ------------------------------------------------------------------------------------------------------------------  Chemistries   Recent Labs Lab 05/28/17 0048  06/01/17 0420  NA 139  < > 135  K 3.5  < > 3.8  CL 107  < > 104  CO2 22  < > 27  GLUCOSE 258*  < > 295*  BUN 14  < > 18  CREATININE 1.16  < > 0.88  CALCIUM 9.2  < > 8.2*  AST 49*  --   --   ALT 24  --   --   ALKPHOS 124  --   --   BILITOT 1.2  --   --   < > = values in this interval not displayed. ------------------------------------------------------------------------------------------------------------------  Cardiac Enzymes No results for input(s): TROPONINI in the last 168 hours. ------------------------------------------------------------------------------------------------------------------  RADIOLOGY:  No results found.   ASSESSMENT AND PLAN:    66 year old male with liver cirrhosis who presented with vomiting and fever.  * Streptococcus constellatatus bacteremia with sepsis present on admission Continue vancomycin PICC line placed on 05/31/2017 Echocardiogram Does not show endocarditis. Discussed with  [facility] cardiology. Patient will be scheduled for TEE tomorrow morning. Repeat blood cultures negative. Appreciate infectious disease input  * Enterococcus  UTI. No urinary symptoms. On vancomycin.  * Vomiting: Patient has denied hematemesis He has not had abdominal pain or vomiting since admission Resolved. Hemoglobin stable.  * Diabetes mellitus type 2. Hold glyburide and metformin from home. Started on Lantus. We will increase dose to 26 units today. Continue sliding scale insulin.  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 An more awake. Continue lactulose daily  Management plans discussed with the patient and he is in agreement.  CODE STATUS: Full  TOTAL TIME TAKING CARE OF THIS PATIENT: 35 minutes.   POSSIBLE D/C 1-2  DEPENDING ON CLINICAL CONDITION.  Milagros Loll R M.D on 06/02/2017 at 12:26 PM  Between 7am to 6pm - Pager - [phone]  After 6pm go to [email or web address] - password [email or web address]  [facility]  Office  [phone]  CC: Primary care physician; System, Pcp Not In  Note: This dictation was prepared with Dragon dictation along with smaller phrase technology. Any transcriptional errors that result from this process are unintentional.

## 2017-06-02 NOTE — Progress Notes (Signed)
[facility] INFECTIOUS DISEASE PROGRESS NOTE Date of Admission:  05/28/2017     ID: Jesus [name] is a 66 y.o. male with viridans strep sepsis Active Problems:   Sepsis (HCC)   Subjective: No fevers, feels stronger.   ROS  Eleven systems are reviewed and negative except per hpi  Medications:  Antibiotics Given (last 72 hours)    Date/Time Action Medication Dose Rate   05/30/17 2200 New Bag/Given   vancomycin (VANCOCIN) 1,250 mg in sodium chloride 0.9 % 250 mL IVPB 1,250 mg 166.7 mL/hr   05/31/17 1010 New Bag/Given   vancomycin (VANCOCIN) 1,250 mg in sodium chloride 0.9 % 250 mL IVPB 1,250 mg 166.7 mL/hr   05/31/17 2022 New Bag/Given   vancomycin (VANCOCIN) 1,250 mg in sodium chloride 0.9 % 250 mL IVPB 1,250 mg 166.7 mL/hr   06/01/17 1048 New Bag/Given   vancomycin (VANCOCIN) 1,250 mg in sodium chloride 0.9 % 250 mL IVPB 1,250 mg 166.7 mL/hr   06/01/17 2157 New Bag/Given   vancomycin (VANCOCIN) 1,250 mg in sodium chloride 0.9 % 250 mL IVPB 1,250 mg 166.7 mL/hr   06/02/17 0915 New Bag/Given   vancomycin (VANCOCIN) 1,250 mg in sodium chloride 0.9 % 250 mL IVPB 1,250 mg 166.7 mL/hr     . docusate sodium  100 mg Oral BID  . furosemide  40 mg Oral Daily  . insulin aspart  0-5 Units Subcutaneous QHS  . insulin aspart  0-9 Units Subcutaneous TID WC  . insulin aspart  5 Units Subcutaneous TID WC  . insulin glargine  26 Units Subcutaneous Daily  . lactulose  30 g Oral TID  . [START ON 06/03/2017] lactulose  30 g Oral Daily  . pantoprazole  40 mg Oral BID  . propranolol  10 mg Oral TID  . spironolactone  25 mg Oral Daily    Objective: Vital signs in last 24 hours: Temp:  [98 F (36.7 C)-98.1 F (36.7 C)] 98 F (36.7 C) (07/03 0854) Pulse Rate:  [66-69] 69 (07/03 0854) Resp:  [20] 20 (07/03 0854) BP: (152-166)/(67-76) 152/76 (07/03 0854) SpO2:  [96 %-98 %] 98 % (07/03 0854) Weight:  [116.5 kg (256 lb 14.5 oz)] 116.5 kg (256 lb 14.5 oz) (07/03 0500) Constitutional: He is  oriented to person, place, and time. Obese, chronically ill appearing.  HENT: anicteric Mouth/Throat: Oropharynx is clear and moist. No oropharyngeal exudate. Missing several teeth but no obvious abscess or infection  Cardiovascular: Normal rate, regular rhythm and normal heart sounds. 2/6 sm Pulmonary/Chest: Effort normal and breath sounds normal. No respiratory distress. He has no wheezes.  Abdominal: Soft. Obese, very large pannus. Bowel sounds are normal. He exhibits no distension. There is no tenderness.  Lymphadenopathy: He has no cervical adenopathy.  Neurological: He is alert and oriented to person, place, and time.  Skin: Skin is warm and dry.Bil LE with changes of  Lipodermatosclerosis.  Psychiatric: He has a normal mood and affect. His behavior is normal.  Ext - Bil knee replacement scars wnl, good rom  Lab Results  Recent Labs  05/31/17 0435 06/01/17 0420  WBC  --  4.2  HGB  --  10.0*  HCT  --  31.0*  NA 137 135  K 3.8 3.8  CL 107 104  CO2 26 27  BUN 18 18  CREATININE 0.96 0.88    Microbiology: Results for orders placed or performed during the hospital encounter of 05/28/17  Blood Culture (routine x 2)     Status: Abnormal  Collection Time: 05/28/17 12:48 AM  Result Value Ref Range Status   Specimen Description BLOOD RT [facility]  Final   Special Requests   Final    BOTTLES DRAWN AEROBIC AND ANAEROBIC Blood Culture results may not be optimal due to an excessive volume of blood received in culture bottles   Culture  Setup Time   Final    GRAM POSITIVE COCCI AEROBIC BOTTLE ONLY CRITICAL VALUE NOTED.  VALUE IS CONSISTENT WITH PREVIOUSLY REPORTED AND CALLED VALUE. CONFIRMED BY PMH    Culture (A)  Final    STREPTOCOCCUS CONSTELLATUS SUSCEPTIBILITIES PERFORMED ON PREVIOUS CULTURE WITHIN THE LAST 5 DAYS. Performed at [facility], [address], [place], [place] [address]    Report Status 06/01/2017 FINAL  Final  Blood Culture (routine x 2)     Status: Abnormal    Collection Time: 05/28/17 12:48 AM  Result Value Ref Range Status   Specimen Description BLOOD LT [facility]  Final   Special Requests   Final    BOTTLES DRAWN AEROBIC AND ANAEROBIC Blood Culture results may not be optimal due to an excessive volume of blood received in culture bottles   Culture  Setup Time   Final    GRAM POSITIVE COCCI CRITICAL RESULT CALLED TO, READ BACK BY AND VERIFIED WITH: MATT MCBANE AT 0015 ON 05/30/17 RWW ANAEROBIC BOTTLE ONLY CONFIRMEN BY PMH Performed at [facility], [address] [place], [place] [address]    Culture STREPTOCOCCUS CONSTELLATUS (A)  Final   Report Status 06/01/2017 FINAL  Final   Organism ID, Bacteria STREPTOCOCCUS CONSTELLATUS  Final      Susceptibility   Streptococcus constellatus - MIC*    PENICILLIN INTERMEDIATE Intermediate     CEFTRIAXONE 1 SENSITIVE Sensitive     ERYTHROMYCIN <=0.12 SENSITIVE Sensitive     LEVOFLOXACIN <=0.25 SENSITIVE Sensitive     VANCOMYCIN 0.5 SENSITIVE Sensitive     * STREPTOCOCCUS CONSTELLATUS  Urine culture     Status: Abnormal   Collection Time: 05/28/17 12:48 AM  Result Value Ref Range Status   Specimen Description URINE, RANDOM  Final   Special Requests NONE  Final   Culture >=100,000 COLONIES/mL ENTEROCOCCUS FAECALIS (A)  Final   Report Status 05/30/2017 FINAL  Final   Organism ID, Bacteria ENTEROCOCCUS FAECALIS (A)  Final      Susceptibility   Enterococcus faecalis - MIC*    AMPICILLIN <=2 SENSITIVE Sensitive     LEVOFLOXACIN >=8 RESISTANT Resistant     NITROFURANTOIN 64 INTERMEDIATE Intermediate     VANCOMYCIN 1 SENSITIVE Sensitive     * >=100,000 COLONIES/mL ENTEROCOCCUS FAECALIS  Blood Culture ID Panel (Reflexed)     Status: None   Collection Time: 05/28/17 12:48 AM  Result Value Ref Range Status   Enterococcus species NOT DETECTED NOT DETECTED Final   Listeria monocytogenes NOT DETECTED NOT DETECTED Final   Staphylococcus species NOT DETECTED NOT DETECTED Final   Staphylococcus aureus  NOT DETECTED NOT DETECTED Final   Streptococcus species NOT DETECTED NOT DETECTED Final   Streptococcus agalactiae NOT DETECTED NOT DETECTED Final   Streptococcus pneumoniae NOT DETECTED NOT DETECTED Final   Streptococcus pyogenes NOT DETECTED NOT DETECTED Final   Acinetobacter baumannii NOT DETECTED NOT DETECTED Final   Enterobacteriaceae species NOT DETECTED NOT DETECTED Final   Enterobacter cloacae complex NOT DETECTED NOT DETECTED Final   Escherichia coli NOT DETECTED NOT DETECTED Final   Klebsiella oxytoca NOT DETECTED NOT DETECTED Final   Klebsiella pneumoniae NOT DETECTED  NOT DETECTED Final   Proteus species NOT DETECTED NOT DETECTED Final   Serratia marcescens NOT DETECTED NOT DETECTED Final   Haemophilus influenzae NOT DETECTED NOT DETECTED Final   Neisseria meningitidis NOT DETECTED NOT DETECTED Final   Pseudomonas aeruginosa NOT DETECTED NOT DETECTED Final   Candida albicans NOT DETECTED NOT DETECTED Final   Candida glabrata NOT DETECTED NOT DETECTED Final   Candida krusei NOT DETECTED NOT DETECTED Final   Candida parapsilosis NOT DETECTED NOT DETECTED Final   Candida tropicalis NOT DETECTED NOT DETECTED Final  Culture, blood (Routine X 2) w Reflex to ID Panel     Status: None (Preliminary result)   Collection Time: 05/30/17  7:31 AM  Result Value Ref Range Status   Specimen Description BLOOD BLOOD RIGHT ARM  Final   Special Requests   Final    BOTTLES DRAWN AEROBIC AND ANAEROBIC Blood Culture adequate volume   Culture NO GROWTH 3 DAYS  Final   Report Status PENDING  Incomplete  Culture, blood (Routine X 2) w Reflex to ID Panel     Status: None (Preliminary result)   Collection Time: 05/30/17  7:32 AM  Result Value Ref Range Status   Specimen Description BLOOD BLOOD LEFT HAND  Final   Special Requests   Final    BOTTLES DRAWN AEROBIC AND ANAEROBIC Blood Culture adequate volume   Culture NO GROWTH 3 DAYS  Final   Report Status PENDING  Incomplete     Studies/Results: No results found.  Assessment/Plan: Deirdre PeerBruce W Ruscitti is a 66 y.o. male admitted with fevers and vomiting in setting of cirrhosis and found to have Strep constellatus bacteremia in 2/2 blood cultures. He does have relatively poor dentition so this is likely source and he is at risk of SBE as this is a viridans strep. His echo is negative but suboptimal. Does have vavular dz with mod AS, calcified MV and mild MS. . He also has bil TKR but no evidence these have been seeded and no other evidence of metastatic infection.  FU bcx 6/30 ngtd  Recommendations Cont ceftriaxone (he reports hives to pcn in past but no anaphylaxis or more serious reaction).  For  TEE 7/5.  Will base duration of IV abx on TEE results.  Thank you very much for the consult. Will follow with you.  FITZGERALD, DAVID P   06/02/2017, 3:51 PM

## 2017-06-02 NOTE — Progress Notes (Signed)
    [facility] has been requested to perform a transesophageal echocardiogram on [name], Jesus for bacteremia.  After careful review of history and examination, the risks and benefits of transesophageal echocardiogram have been explained including risks of esophageal damage, perforation (1:10,000 risk), bleeding, pharyngeal hematoma as well as other potential complications associated with conscious sedation including aspiration, arrhythmia, respiratory failure and death. Alternatives to treatment were discussed, questions were answered. Patient is willing to proceed.   Case to be scheduled on 7/5 secondary the July 4th holiday and specials scheduling.   Eula Listenyan Talicia Sui, PA-C 06/02/2017 12:41 PM

## 2017-06-02 NOTE — Progress Notes (Signed)
ANTIBIOTIC CONSULT NOTE - INITIAL  Pharmacy Consult for Vancomycin  Indication: bacteremia/ uti  Allergies  Allergen Reactions  . Penicillins Hives and Other (See Comments)    Has patient had a PCN reaction causing immediate rash, facial/tongue/throat swelling, SOB or lightheadedness with hypotension: Yes Has patient had a PCN reaction causing severe rash involving mucus membranes or skin necrosis: No Has patient had a PCN reaction that required hospitalization: No Has patient had a PCN reaction occurring within the last 10 years: No If all of the above answers are "NO", then may proceed with Cephalosporin use.     Patient Measurements: Height: 5\' 8"  (172.7 cm) Weight: 256 lb 14.4 oz (116.5 kg) IBW/kg (Calculated) : 68.4 Adjusted Body Weight:   Vital Signs: Temp: 98.1 F (36.7 C) (07/02 2254) Temp Source: Oral (07/02 2254) BP: 166/72 (07/02 2254) Pulse Rate: 66 (07/02 2254) Intake/Output from previous day: 07/02 0701 - 07/03 0700 In: 1090 [P.O.:840; IV Piggyback:250] Out: 1525 [Urine:1525] Intake/Output from this shift: No intake/output data recorded.  Labs:  Recent Labs  05/30/17 0417 05/31/17 0435 06/01/17 0420  WBC 6.0  --  4.2  HGB 10.0*  --  10.0*  PLT 68*  --  74*  CREATININE 1.13 0.96 0.88   Estimated Creatinine Clearance: 102.3 mL/min (by C-G formula based on SCr of 0.88 mg/dL).  Recent Labs  06/01/17 2144  [facility] 17     Microbiology: Recent Results (from the past 720 hour(s))  Blood Culture (routine x 2)     Status: Abnormal   Collection Time: 05/28/17 12:48 AM  Result Value Ref Range Status   Specimen Description BLOOD RT [facility]  Final   Special Requests   Final    BOTTLES DRAWN AEROBIC AND ANAEROBIC Blood Culture results may not be optimal due to an excessive volume of blood received in culture bottles   Culture  Setup Time   Final    GRAM POSITIVE COCCI AEROBIC BOTTLE ONLY CRITICAL VALUE NOTED.  VALUE IS CONSISTENT WITH PREVIOUSLY  REPORTED AND CALLED VALUE. CONFIRMED BY PMH    Culture (A)  Final    STREPTOCOCCUS CONSTELLATUS SUSCEPTIBILITIES PERFORMED ON PREVIOUS CULTURE WITHIN THE LAST 5 DAYS. Performed at [facility], [address] [place], [place] [address]    Report Status 06/01/2017 FINAL  Final  Blood Culture (routine x 2)     Status: Abnormal   Collection Time: 05/28/17 12:48 AM  Result Value Ref Range Status   Specimen Description BLOOD LT [facility]  Final   Special Requests   Final    BOTTLES DRAWN AEROBIC AND ANAEROBIC Blood Culture results may not be optimal due to an excessive volume of blood received in culture bottles   Culture  Setup Time   Final    GRAM POSITIVE COCCI CRITICAL RESULT CALLED TO, READ BACK BY AND VERIFIED WITH: MATT MCBANE AT 0015 ON 05/30/17 RWW ANAEROBIC BOTTLE ONLY CONFIRMEN BY PMH Performed at [facility], [address], [place], [place] [address]    Culture STREPTOCOCCUS CONSTELLATUS (A)  Final   Report Status 06/01/2017 FINAL  Final   Organism ID, Bacteria STREPTOCOCCUS CONSTELLATUS  Final      Susceptibility   Streptococcus constellatus - MIC*    PENICILLIN INTERMEDIATE Intermediate     CEFTRIAXONE 1 SENSITIVE Sensitive     ERYTHROMYCIN <=0.12 SENSITIVE Sensitive     LEVOFLOXACIN <=0.25 SENSITIVE Sensitive     VANCOMYCIN 0.5 SENSITIVE Sensitive     * STREPTOCOCCUS CONSTELLATUS  Urine culture  Status: Abnormal   Collection Time: 05/28/17 12:48 AM  Result Value Ref Range Status   Specimen Description URINE, RANDOM  Final   Special Requests NONE  Final   Culture >=100,000 COLONIES/mL ENTEROCOCCUS FAECALIS (A)  Final   Report Status 05/30/2017 FINAL  Final   Organism ID, Bacteria ENTEROCOCCUS FAECALIS (A)  Final      Susceptibility   Enterococcus faecalis - MIC*    AMPICILLIN <=2 SENSITIVE Sensitive     LEVOFLOXACIN >=8 RESISTANT Resistant     NITROFURANTOIN 64 INTERMEDIATE Intermediate     VANCOMYCIN 1 SENSITIVE Sensitive     * >=100,000 COLONIES/mL  ENTEROCOCCUS FAECALIS  Blood Culture ID Panel (Reflexed)     Status: None   Collection Time: 05/28/17 12:48 AM  Result Value Ref Range Status   Enterococcus species NOT DETECTED NOT DETECTED Final   Listeria monocytogenes NOT DETECTED NOT DETECTED Final   Staphylococcus species NOT DETECTED NOT DETECTED Final   Staphylococcus aureus NOT DETECTED NOT DETECTED Final   Streptococcus species NOT DETECTED NOT DETECTED Final   Streptococcus agalactiae NOT DETECTED NOT DETECTED Final   Streptococcus pneumoniae NOT DETECTED NOT DETECTED Final   Streptococcus pyogenes NOT DETECTED NOT DETECTED Final   Acinetobacter baumannii NOT DETECTED NOT DETECTED Final   Enterobacteriaceae species NOT DETECTED NOT DETECTED Final   Enterobacter cloacae complex NOT DETECTED NOT DETECTED Final   Escherichia coli NOT DETECTED NOT DETECTED Final   Klebsiella oxytoca NOT DETECTED NOT DETECTED Final   Klebsiella pneumoniae NOT DETECTED NOT DETECTED Final   Proteus species NOT DETECTED NOT DETECTED Final   Serratia marcescens NOT DETECTED NOT DETECTED Final   Haemophilus influenzae NOT DETECTED NOT DETECTED Final   Neisseria meningitidis NOT DETECTED NOT DETECTED Final   Pseudomonas aeruginosa NOT DETECTED NOT DETECTED Final   Candida albicans NOT DETECTED NOT DETECTED Final   Candida glabrata NOT DETECTED NOT DETECTED Final   Candida krusei NOT DETECTED NOT DETECTED Final   Candida parapsilosis NOT DETECTED NOT DETECTED Final   Candida tropicalis NOT DETECTED NOT DETECTED Final  Culture, blood (Routine X 2) w Reflex to ID Panel     Status: None (Preliminary result)   Collection Time: 05/30/17  7:31 AM  Result Value Ref Range Status   Specimen Description BLOOD BLOOD RIGHT ARM  Final   Special Requests   Final    BOTTLES DRAWN AEROBIC AND ANAEROBIC Blood Culture adequate volume   Culture NO GROWTH 2 DAYS  Final   Report Status PENDING  Incomplete  Culture, blood (Routine X 2) w Reflex to ID Panel     Status:  None (Preliminary result)   Collection Time: 05/30/17  7:32 AM  Result Value Ref Range Status   Specimen Description BLOOD BLOOD LEFT HAND  Final   Special Requests   Final    BOTTLES DRAWN AEROBIC AND ANAEROBIC Blood Culture adequate volume   Culture NO GROWTH 2 DAYS  Final   Report Status PENDING  Incomplete    Medical History: Past Medical History:  Diagnosis Date  . Cirrhosis of liver (HCC)   . Diabetes (HCC)     Medications:  Prescriptions Prior to Admission  Medication Sig Dispense Refill Last Dose  . furosemide (LASIX) 40 MG tablet Take 40 mg by mouth daily.   Past Week at Unknown time  . glyBURIDE (DIABETA) 5 MG tablet Take 5 mg by mouth daily.   Past Week at Unknown time  . metFORMIN (GLUCOPHAGE) 1000 MG tablet Take 1,000 mg by  mouth 2 (two) times daily with a meal.   Past Week at unknown  . traZODone (DESYREL) 50 MG tablet Take 75 mg by mouth at bedtime.    Past Week at Unknown time   Scheduled:  . docusate sodium  100 mg Oral BID  . furosemide  40 mg Oral Daily  . insulin aspart  0-5 Units Subcutaneous QHS  . insulin aspart  0-9 Units Subcutaneous TID WC  . insulin glargine  26 Units Subcutaneous Daily  . lactulose  30 g Oral TID  . pantoprazole (PROTONIX) IV  40 mg Intravenous Q12H  . propranolol  10 mg Oral TID  . spironolactone  25 mg Oral Daily   Infusions:  . vancomycin Stopped (06/01/17 2327)   Assessment: Pharmacy consulted to dose and monitor Vancomycin in this 66 year old male. Spoke with MD Mody and she would like to continue Vancomycin for GPC bacteremia (no staph detected @ this time) and E. Faecalis urinary tract infection.  Goal of Therapy:  Vancomycin trough level 15-20 mcg/ml  Plan:  Will give Vancomycin 2 g IV x 1 and will start Vancomycin 1250 mg IV q12 hours. Trough level prior to the 5th dose of regimen.   7/2 @ 2144 VT 17 therapeutic. Drawn 1 hour early, but appropriate. Will continue current regimen and recheck VT 7/3 @ 2100 to ensure  therapeutic trough.  Thomasene Rippleavid Brianca Fortenberry, PharmD, BCPS Clinical Pharmacist 06/02/2017

## 2017-06-02 NOTE — Progress Notes (Signed)
PHARMACIST - PHYSICIAN COMMUNICATION  DR:   Elpidio AnisSudini  CONCERNING: IV to Oral Route Change Policy  RECOMMENDATION: This patient is receiving protonix by the intravenous route.  Based on criteria approved by the Pharmacy and Therapeutics Committee, the intravenous medication(s) is/are being converted to the equivalent oral dose form(s).   DESCRIPTION: These criteria include:  The patient is eating (either orally or via tube) and/or has been taking other orally administered medications for a least 24 hours  The patient has no evidence of active gastrointestinal bleeding or impaired GI absorption (gastrectomy, short bowel, patient on TNA or NPO).  If you have questions about this conversion, please contact the Pharmacy Department  []   [phone]( [phone] )  [facility] [x]   [phone]( [phone] )  [facility] []   [phone]( [phone] )  [facility] []   [phone]( [phone] )  [facility] []   [phone]( [phone] )  [facility]   Olene FlossMelissa D Noni Stonesifer, [place].D, BCPS Clinical Pharmacist  06/02/2017 8:39 AM

## 2017-06-03 LAB — GLUCOSE, CAPILLARY
Glucose-Capillary: 230 mg/dL — ABNORMAL HIGH (ref 65–99)
Glucose-Capillary: 307 mg/dL — ABNORMAL HIGH (ref 65–99)
Glucose-Capillary: 290 mg/dL — ABNORMAL HIGH (ref 65–99)
Glucose-Capillary: 314 mg/dL — ABNORMAL HIGH (ref 65–99)

## 2017-06-03 MED ORDER — SODIUM CHLORIDE 0.9 % IV SOLN
INTRAVENOUS | Status: DC
  Administered 2017-06-03: 21:00:00 via INTRAVENOUS

## 2017-06-03 NOTE — Progress Notes (Signed)
ID E note No fevers.  Await TEE. Cont ceftriaxone

## 2017-06-03 NOTE — Care Management Important Message (Signed)
Important Message  Patient Details  Name: Jesus [name] MRN: [id number] Date of Birth: 04/06/1951   Medicare Important Message Given:  Yes    Gwenette GreetBrenda S Haylie Mccutcheon, RN 06/03/2017, 7:49 AM

## 2017-06-03 NOTE — Progress Notes (Signed)
[facility] - [place] at [facility]   PATIENT NAME: Renelda MomBruce Cureton    MR#:  [id number]  DATE OF BIRTH:  08/27/1951  SUBJECTIVE:   Awake and alert. No pain. Some diarrhea due to lactulose. Waiting for his TEE.  REVIEW OF SYSTEMS:    Review of Systems  ConstitutionalNo fevers  No chills weight loss HENT: Negative for ear pain, nosebleeds, congestion, facial swelling, rhinorrhea, neck pain, neck stiffness and ear discharge.   Respiratory: Negative for cough, shortness of breath, wheezing  Cardiovascular: Negative for chest pain, palpitations and leg swelling.  Gastrointestinal: Negative for heartburn, abdominal pain, vomiting, diarrhea or consitpation Genitourinary: Negative for dysuria, urgency, frequency, hematuria Musculoskeletal: Negative for back pain or joint pain Neurological: Negative for dizziness, seizures, syncope, focal weakness,  numbness and headaches.  Hematological: Does not bruise/bleed easily.  Psychiatric/Behavioral: Negative for hallucinations, confusion, dysphoric mood  DRUG ALLERGIES:   Allergies  Allergen Reactions  . Penicillins Hives and Other (See Comments)    Has patient had a PCN reaction causing immediate rash, facial/tongue/throat swelling, SOB or lightheadedness with hypotension: Yes Has patient had a PCN reaction causing severe rash involving mucus membranes or skin necrosis: No Has patient had a PCN reaction that required hospitalization: No Has patient had a PCN reaction occurring within the last 10 years: No If all of the above answers are "NO", then may proceed with Cephalosporin use.     VITALS:  Blood pressure (!) 150/78, pulse 64, temperature 98.4 F (36.9 C), temperature source Oral, resp. rate 18, height 5\' 8"  (1.727 m), weight 116.5 kg (256 lb 14.5 oz), SpO2 98 %.  PHYSICAL EXAMINATION:  Constitutional: Appears well-developed and well-nourished. No distress. HENT: Normocephalic. Marland Kitchen. Oropharynx is clear and moist.  Eyes:  Conjunctivae and EOM are normal. PERRLA, no scleral icterus.  Neck: Normal ROM. Neck supple. No JVD. No tracheal deviation. CVS: RRR, S1/S2 +, 3/6 murmur, no gallops, no carotid bruit.  Pulmonary: Effort and breath sounds normal, no stridor, rhonchi, wheezes, rales.  Abdominal: Soft. BS +,  no distension, tenderness, rebound or guarding.  Musculoskeletal: Normal range of motion. 1+ LEE and no tenderness.  Neuro: Alert. CN 2-12 grossly intact. No focal deficits. Skin: Skin is warm and dry. No rash noted. Psychiatric: Normal mood and affect.  Right upper extremity PICC line in place  LABORATORY PANEL:   CBC  Recent Labs Lab 06/01/17 0420  WBC 4.2  HGB 10.0*  HCT 31.0*  PLT 74*   ------------------------------------------------------------------------------------------------------------------  Chemistries   Recent Labs Lab 05/28/17 0048  06/01/17 0420  NA 139  < > 135  K 3.5  < > 3.8  CL 107  < > 104  CO2 22  < > 27  GLUCOSE 258*  < > 295*  BUN 14  < > 18  CREATININE 1.16  < > 0.88  CALCIUM 9.2  < > 8.2*  AST 49*  --   --   ALT 24  --   --   ALKPHOS 124  --   --   BILITOT 1.2  --   --   < > = values in this interval not displayed. ------------------------------------------------------------------------------------------------------------------  Cardiac Enzymes No results for input(s): TROPONINI in the last 168 hours. ------------------------------------------------------------------------------------------------------------------  RADIOLOGY:  No results found.   ASSESSMENT AND PLAN:    66 year old male with liver cirrhosis who presented with vomiting and fever.  * Streptococcus constellatatus bacteremia with sepsis present on admission Continue vancomycin PICC line placed on 05/31/2017 Echocardiogram  Does not show endocarditis. TEE scheduled for tomorrow Repeat blood cultures negative. Appreciate infectious disease input  * Enterococcus UTI. No urinary  symptoms.  Finished vancomycin.  * Vomiting: Patient has denied hematemesis He has not had abdominal pain or vomiting since admission Resolved. Hemoglobin stable.  * Diabetes mellitus type 2. Hold glyburide and metformin from home. Started on Lantus. We will increase dose to 26 units today. Continue sliding scale insulin.  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 Now more awake. Continue lactulose daily.  Management plans discussed with the patient and wife at bedside, they are in agreement.  CODE STATUS: Full  TOTAL TIME TAKING CARE OF THIS PATIENT: 35 minutes.   POSSIBLE D/C 1-2  DEPENDING ON CLINICAL CONDITION.  Milagros Loll R M.D on 06/03/2017 at 12:34 PM  Between 7am to 6pm - Pager - [phone]  After 6pm go to [email or web address] - [email or web address]  [facility] [place] [facility]  Office  [phone]  CC: Primary care physician; System, Pcp Not In  Note: This dictation was prepared with Dragon dictation along with smaller phrase technology. Any transcriptional errors that result from this process are unintentional.

## 2017-06-04 ENCOUNTER — Inpatient Hospital Stay (HOSPITAL_COMMUNITY)
Admit: 2017-06-04 | Discharge: 2017-06-04 | Disposition: A | Discharge status: Home / Self Care | Payer: Medicare PPO | Attending: Physician Assistant | Admitting: Physician Assistant

## 2017-06-04 ENCOUNTER — Encounter
Admission: EM | Disposition: A | Discharge status: Home / Self Care | Payer: Self-pay | Source: Home / Self Care | Attending: Internal Medicine

## 2017-06-04 DIAGNOSIS — I340 Nonrheumatic mitral (valve) insufficiency: Secondary | ICD-10-CM

## 2017-06-04 HISTORY — PX: TEE WITHOUT CARDIOVERSION: SHX5443

## 2017-06-04 LAB — CULTURE, BLOOD (ROUTINE X 2)
CULTURE: NO GROWTH
Culture: NO GROWTH
Special Requests: ADEQUATE
Special Requests: ADEQUATE

## 2017-06-04 LAB — GLUCOSE, CAPILLARY
Glucose-Capillary: 233 mg/dL — ABNORMAL HIGH (ref 65–99)
Glucose-Capillary: 199 mg/dL — ABNORMAL HIGH (ref 65–99)

## 2017-06-04 SURGERY — ECHOCARDIOGRAM, TRANSESOPHAGEAL
Anesthesia: Moderate Sedation

## 2017-06-04 MED ORDER — MIDAZOLAM HCL 5 MG/5ML IJ SOLN
INTRAMUSCULAR | Status: AC
  Administered 2017-06-04: 10:00:00
  Filled 2017-06-04: qty 5

## 2017-06-04 MED ORDER — FENTANYL CITRATE (PF) 100 MCG/2ML IJ SOLN
INTRAMUSCULAR | Status: AC | PRN
  Administered 2017-06-04: 50 ug via INTRAVENOUS
  Administered 2017-06-04: 25 ug via INTRAVENOUS

## 2017-06-04 MED ORDER — PANTOPRAZOLE SODIUM 40 MG PO TBEC: 40.0000 mg | DELAYED_RELEASE_TABLET | Freq: Every day | ORAL | 0 refills | Status: DC

## 2017-06-04 MED ORDER — FENTANYL CITRATE (PF) 100 MCG/2ML IJ SOLN
INTRAMUSCULAR | Status: AC
  Administered 2017-06-04: 10:00:00
  Filled 2017-06-04: qty 2

## 2017-06-04 MED ORDER — LACTULOSE 10 GM/15ML PO SOLN: 30.0000 g | Freq: Every day | ORAL | 0 refills | Status: DC

## 2017-06-04 MED ORDER — INSULIN ASPART 100 UNIT/ML ~~LOC~~ SOLN
7.0000 [IU] | Freq: Three times a day (TID) | SUBCUTANEOUS | Status: DC
  Administered 2017-06-04: 7 [IU] via SUBCUTANEOUS
  Filled 2017-06-04: qty 1

## 2017-06-04 MED ORDER — INSULIN ASPART 100 UNIT/ML ~~LOC~~ SOLN: 7.0000 [IU] | Freq: Three times a day (TID) | SUBCUTANEOUS | 11 refills | Status: DC

## 2017-06-04 MED ORDER — MIDAZOLAM HCL 5 MG/5ML IJ SOLN
INTRAMUSCULAR | Status: AC | PRN
  Administered 2017-06-04: 1 mg via INTRAVENOUS
  Administered 2017-06-04: 2 mg via INTRAVENOUS

## 2017-06-04 MED ORDER — BUTAMBEN-TETRACAINE-BENZOCAINE 2-2-14 % EX AERO
INHALATION_SPRAY | CUTANEOUS | Status: AC
  Administered 2017-06-04: 10:00:00
  Filled 2017-06-04: qty 20

## 2017-06-04 MED ORDER — LEVOFLOXACIN 750 MG PO TABS
750.0000 mg | ORAL_TABLET | Freq: Every day | ORAL | 0 refills | Status: DC
Start: 2017-06-04 — End: 2017-08-22

## 2017-06-04 MED ORDER — LIDOCAINE VISCOUS 2 % MT SOLN
OROMUCOSAL | Status: AC
  Administered 2017-06-04: 10:00:00
  Filled 2017-06-04: qty 15

## 2017-06-04 MED ORDER — INSULIN GLARGINE 100 UNIT/ML ~~LOC~~ SOLN: 34.0000 [IU] | Freq: Every day | SUBCUTANEOUS | 11 refills | Status: DC

## 2017-06-04 MED ORDER — INSULIN GLARGINE 100 UNIT/ML ~~LOC~~ SOLN
34.0000 [IU] | Freq: Every day | SUBCUTANEOUS | Status: DC
  Filled 2017-06-04 (×2): qty 0.34

## 2017-06-04 MED ORDER — LEVOFLOXACIN 500 MG PO TABS
750.0000 mg | ORAL_TABLET | Freq: Every day | ORAL | Status: DC
  Administered 2017-06-04: 750 mg via ORAL
  Filled 2017-06-04: qty 2

## 2017-06-04 NOTE — Care Management Note (Addendum)
Case Management Note  Patient Details  Name: Jesus [name] MRN: [id number] Date of Birth: 1951/05/09  Subjective/Objective:  Case dicussed with attending. Plan is home today with IV antibiotics. Met with patient and his wife at bedside. Wife will be able to assist with administration of antibiotics. Patient has a walker at home but does not consistently use it. No DME needs.  No home O2. PCP is Dr. Arnoldo Morale at the [facility]. Offered choice of home health agencies. [facility]erral to [facility] for nursing and IV antibiotics. Patient states he goes to [facility] for OP PT 2 times a week for his back. PT recommends he continue OP PT. Corene Cornea with [facility] updated on current Rocephin schedule.                   Action/Plan: [facility] for IV abx and nursing.   Expected Discharge Date:                  Expected Discharge Plan:  [place]  In-House Referral:     Discharge planning Services  CM Consult  Post Acute Care Choice:  Home Health Choice offered to:  Patient, Spouse  DME Arranged:    DME Agency:  [place] Arranged:  RN [facility] Agency:  [place]  Status of Service:  Completed, signed off  If discussed at [place] of Stay Meetings, dates discussed:    Additional Comments:  Jolly Mango, RN 06/04/2017, 9:28 AM

## 2017-06-04 NOTE — Care Management (Signed)
TEE negative. PICC line to be discontinued. Patient to be sent home with PO abx. He will continue OP PT. Patient and wife updated. Case Closed.

## 2017-06-04 NOTE — Progress Notes (Signed)
Patient back from TEE. Patient alert and oriented. Denies pain, SOB, difficulty swallowing. RN will start patient on ice chips at 1230 and advance diet as tolerated. Vitals stable.  Harvie HeckMelanie Yuuki Skeens, RN  .

## 2017-06-04 NOTE — Discharge Instructions (Signed)
Keep log of all his sugars at home  Follow up with Dr. Lovell She[facility]hanJenkins at  [facility]

## 2017-06-04 NOTE — Discharge Summary (Signed)
[facility] - [place] at [facility]   PATIENT NAME: Jesus [name]    MR#:  [id number]  DATE OF BIRTH:  January 19, 1951  DATE OF ADMISSION:  05/28/2017 ADMITTING PHYSICIAN: Arnaldo Natal, MD  DATE OF DISCHARGE 06/04/2017  PRIMARY CARE PHYSICIAN: System,Dr Lovell Sheehan, [facility]   ADMISSION DIAGNOSIS:  Sepsis, due to unspecified organism (HCC) [A41.9] Fever, unspecified fever cause [R50.9] Urinary tract infection without hematuria, site unspecified [N39.0]  DISCHARGE DIAGNOSIS:  Streptococcus constellatatus bacteremia with sepsis present on admission Enterococcus UTI.  SECONDARY DIAGNOSIS:   Past Medical History:  Diagnosis Date  . Cirrhosis of liver (HCC)   . Diabetes [facility])     HOSPITAL COURSE:  66 year old male with liver cirrhosis who presented with vomiting and fever.  * Streptococcus constellatatus bacteremia with sepsis present on admission Continue IV Rocephin-----discussed with ID recommends change to oral Levaquin 750 mg by mouth daily for 3 more days to finish a 10 day total course of antibiotic PICC line placed on 05/31/2017----will DC PICC line Echocardiogram Does not show endocarditis. TEE  negative for vegetation Repeat blood cultures negative. Appreciate infectious disease input  * Enterococcus UTI. No urinary symptoms.  Finished vancomycin.  * Vomiting: Patient has denied hematemesis He has not had abdominal pain or vomiting since admission Resolved. Hemoglobin stable.  * Diabetes mellitus type 2 -. Resume glyburide and metformin from home. Started on Lantus. We will increase dose to 34 units today.  Continue sliding scale insulin. Continue aspart 7 units 3 times a day Discussed with patient's wife. She is also diabetic and is comfortable giving insulin to patient and monitor sugars.  * History of liver cirrhosis With chronic thrombocytopenia Continue Lasix   * Hepatic encephalopathy with ammonia level - 56 Started  lactulose. Now more awake. Continue lactulose daily.  Overall stable. Patient is at baseline. He will [facility] and primary care physician Discharge home CONSULTS OBTAINED:  Treatment Team:  Mick Sell, MD End, Cristal Deer, MD  DRUG ALLERGIES:   Allergies  Allergen Reactions  . Penicillins Hives and Other (See Comments)    Has patient had a PCN reaction causing immediate rash, facial/tongue/throat swelling, SOB or lightheadedness with hypotension: Yes Has patient had a PCN reaction causing severe rash involving mucus membranes or skin necrosis: No Has patient had a PCN reaction that required hospitalization: No Has patient had a PCN reaction occurring within the last 10 years: No If all of the above answers are "NO", then may proceed with Cephalosporin use.     DISCHARGE MEDICATIONS:   Current Discharge Medication List    START taking these medications   Details  insulin aspart (NOVOLOG) 100 UNIT/ML injection Inject 7 Units into the skin 3 (three) times daily with meals. Qty: 10 mL, Refills: 11    insulin glargine (LANTUS) 100 UNIT/ML injection Inject 0.34 mLs (34 Units total) into the skin daily. Qty: 10 mL, Refills: 11    lactulose (CHRONULAC) 10 GM/15ML solution Take 45 mLs (30 g total) by mouth daily. Qty: 240 mL, Refills: 0    levofloxacin (LEVAQUIN) 750 MG tablet Take 1 tablet (750 mg total) by mouth daily. Qty: 3 tablet, Refills: 0    pantoprazole (PROTONIX) 40 MG tablet Take 1 tablet (40 mg total) by mouth daily. Qty: 30 tablet, Refills: 0      CONTINUE these medications which have NOT CHANGED   Details  furosemide (LASIX) 40 MG tablet Take 40 mg by mouth daily.  glyBURIDE (DIABETA) 5 MG tablet Take 5 mg by mouth daily.    metFORMIN (GLUCOPHAGE) 1000 MG tablet Take 1,000 mg by mouth 2 (two) times daily with a meal.    traZODone (DESYREL) 50 MG tablet Take 75 mg by mouth at bedtime.         If you experience worsening of  your admission symptoms, develop shortness of breath, life threatening emergency, suicidal or homicidal thoughts you must seek medical attention immediately by calling 911 or calling your MD immediately  if symptoms less severe.  You Must read complete instructions/literature along with all the possible adverse reactions/side effects for all the Medicines you take and that have been prescribed to you. Take any new Medicines after you have completely understood and accept all the possible adverse reactions/side effects.   Please note  You were cared for by a hospitalist during your hospital stay. If you have any questions about your discharge medications or the care you received while you were in the hospital after you are discharged, you can call the unit and asked to speak with the hospitalist on call if the hospitalist that took care of you is not available. Once you are discharged, your primary care physician will handle any further medical issues. Please note that NO REFILLS for any discharge medications will be authorized once you are discharged, as it is imperative that you return to your primary care physician (or establish a relationship with a primary care physician if you do not have one) for your aftercare needs so that they can reassess your need for medications and monitor your lab values. Today   SUBJECTIVE   Doing well no complaints  VITAL SIGNS:  Blood pressure 134/71, pulse 63, temperature 97.6 F (36.4 C), temperature source Oral, resp. rate 16, height 5\' 8"  (1.727 m), weight 113.9 kg (251 lb 1.6 oz), SpO2 96 %.  I/O:   Intake/Output Summary (Last 24 hours) at 06/04/17 1155 Last data filed at 06/04/17 0807  Gross per 24 hour  Intake           982.67 ml  Output             1450 ml  Net          -467.33 ml    PHYSICAL EXAMINATION:  GENERAL:  66 y.o.-year-old patient lying in the bed with no acute distress.  EYES: Pupils equal, round, reactive to light and accommodation.  No scleral icterus. Extraocular muscles intact.  HEENT: Head atraumatic, normocephalic. Oropharynx and nasopharynx clear.  NECK:  Supple, no jugular venous distention. No thyroid enlargement, no tenderness.  LUNGS: Normal breath sounds bilaterally, no wheezing, rales,rhonchi or crepitation. No use of accessory muscles of respiration.  CARDIOVASCULAR: S1, S2 normal. No murmurs, rubs, or gallops.  ABDOMEN: Soft, non-tender, non-distended. Bowel sounds present. No organomegaly or mass.  EXTREMITIES: No pedal edema, cyanosis, or clubbing.  NEUROLOGIC: Cranial nerves II through XII are intact. Muscle strength 5/5 in all extremities. Sensation intact. Gait not checked.  PSYCHIATRIC: The patient is alert and oriented x 3.  SKIN: No obvious rash, lesion, or ulcer.   DATA REVIEW:   CBC   Recent Labs Lab 06/01/17 0420  WBC 4.2  HGB 10.0*  HCT 31.0*  PLT 74*    Chemistries   Recent Labs Lab 06/01/17 0420  NA 135  K 3.8  CL 104  CO2 27  GLUCOSE 295*  BUN 18  CREATININE 0.88  CALCIUM 8.2*    Microbiology Results  Recent Results (from the past 240 hour(s))  Blood Culture (routine x 2)     Status: Abnormal   Collection Time: 05/28/17 12:48 AM  Result Value Ref Range Status   Specimen Description [facility]OD [facility]  Final   Special Requests   Final    BOTTLES DRAWN AEROBIC AND ANAEROBIC Blood Culture results may not be optimal due to an excessive volume of blood received in culture bottles   Culture  Setup Time   Final    GRAM POSITIVE COCCI AEROBIC BOTTLE ONLY CRITICAL VALUE NOTED.  VALUE IS CONSISTENT WITH PREVIOUSLY REPORTED AND CALLED VALUE. CONFIRMED BY [facility]    Culture (A)  Final    STREPTOCOCCUS CONSTELLATUS SUSCEPTIBILITIES PERFORMED ON PREVIOUS CULTURE WITHIN THE LAST 5 DAYS. Performed at [facility], [address] [place], [place] [address]    Report Status 06/01/2017 FINAL  Final  Blood Culture (routine x 2)     Status: Abnormal   Collection Time: 05/28/17  12:48 AM  Result Value Ref Range Status   Specimen Description [facility]LOOD [facility]  Final   Special Requests   Final    BOTTLES DRAWN AEROBIC AND ANAEROBIC Blood Culture results may not be optimal due to an excessive volume of blood received in culture bottles   Culture  Setup Time   Final    GRAM POSITIVE COCCI CRITICAL RESULT CALLED TO, READ BACK BY AND VERIFIED WITH: MATT MCBANE AT 0015 ON 05/30/17 RWW ANAEROBIC BOTTLE ONLY CONFIRMEN BY [facility] Performed at [facility], [address] [place], [place] [address]    Culture STREPTOCOCCUS CONSTELLATUS (A)  Final   Report Status 06/01/2017 FINAL  Final   Organism ID, Bacteria STREPTOCOCCUS CONSTELLATUS  Final      Susceptibility   Streptococcus constellatus - MIC*    PENICILLIN INTERMEDIATE Intermediate     CEFTRIAXONE 1 SENSITIVE Sensitive     ERYTHROMYCIN <=0.12 SENSITIVE Sensitive     LEVOFLOXACIN <=0.25 SENSITIVE Sensitive     VANCOMYCIN 0.5 SENSITIVE Sensitive     * STREPTOCOCCUS CONSTELLATUS  Urine culture     Status: Abnormal   Collection Time: 05/28/17 12:48 AM  Result Value Ref Range Status   Specimen Description URINE, RANDOM  Final   Special Requests NONE  Final   Culture >=100,000 COLONIES/mL ENTEROCOCCUS FAECALIS (A)  Final   Report Status 05/30/2017 FINAL  Final   Organism ID, Bacteria ENTEROCOCCUS FAECALIS (A)  Final      Susceptibility   Enterococcus faecalis - MIC*    AMPICILLIN <=2 SENSITIVE Sensitive     LEVOFLOXACIN >=8 RESISTANT Resistant     NITROFURANTOIN 64 INTERMEDIATE Intermediate     VANCOMYCIN 1 SENSITIVE Sensitive     * >=100,000 COLONIES/mL ENTEROCOCCUS FAECALIS  Blood Culture ID Panel (Reflexed)     Status: None   Collection Time: 05/28/17 12:48 AM  Result Value Ref Range Status   Enterococcus species NOT DETECTED NOT DETECTED Final   Listeria monocytogenes NOT DETECTED NOT DETECTED Final   Staphylococcus species NOT DETECTED NOT DETECTED Final   Staphylococcus aureus NOT DETECTED NOT DETECTED Final    Streptococcus species NOT DETECTED NOT DETECTED Final   Streptococcus agalactiae NOT DETECTED NOT DETECTED Final   Streptococcus pneumoniae NOT DETECTED NOT DETECTED Final   Streptococcus pyogenes NOT DETECTED NOT DETECTED Final   Acinetobacter baumannii NOT DETECTED NOT DETECTED Final   Enterobacteriaceae species NOT DETECTED NOT DETECTED Final   Enterobacter cloacae complex NOT DETECTED NOT DETECTED Final   Escherichia  coli NOT DETECTED NOT DETECTED Final   Klebsiella oxytoca NOT DETECTED NOT DETECTED Final   Klebsiella pneumoniae NOT DETECTED NOT DETECTED Final   Proteus species NOT DETECTED NOT DETECTED Final   Serratia marcescens NOT DETECTED NOT DETECTED Final   Haemophilus influenzae NOT DETECTED NOT DETECTED Final   Neisseria meningitidis NOT DETECTED NOT DETECTED Final   Pseudomonas aeruginosa NOT DETECTED NOT DETECTED Final   Candida albicans NOT DETECTED NOT DETECTED Final   Candida glabrata NOT DETECTED NOT DETECTED Final   Candida krusei NOT DETECTED NOT DETECTED Final   Candida parapsilosis NOT DETECTED NOT DETECTED Final   Candida tropicalis NOT DETECTED NOT DETECTED Final  Culture, blood (Routine X 2) w Reflex to ID Panel     Status: None   Collection Time: 05/30/17  7:31 AM  Result Value Ref Range Status   Specimen Description BLOOD BLOOD RIGHT ARM  Final   Special Requests   Final    BOTTLES DRAWN AEROBIC AND ANAEROBIC Blood Culture adequate volume   Culture NO GROWTH 5 DAYS  Final   Report Status 06/04/2017 FINAL  Final  Culture, blood (Routine X 2) w Reflex to ID Panel     Status: None   Collection Time: 05/30/17  7:32 AM  Result Value Ref Range Status   Specimen Description BLOOD BLOOD LEFT HAND  Final   Special Requests   Final    BOTTLES DRAWN AEROBIC AND ANAEROBIC Blood Culture adequate volume   Culture NO GROWTH 5 DAYS  Final   Report Status 06/04/2017 FINAL  Final    RADIOLOGY:  No results found.   Management plans discussed with the patient,  family and they are in agreement.  CODE STATUS:     Code Status Orders        Start     Ordered   05/28/17 0516  Full code  Continuous     05/28/17 0515    Code Status History    Date Active Date Inactive Code Status Order ID Comments User Context   This patient has a current code status but no historical code status.      TOTAL TIME TAKING CARE OF THIS PATIENT: 40 minutes.    Shiloh Southern M.D on 06/04/2017 at 11:55 AM  Between 7am to 6pm - Pager - [phone] After 6pm go to [email or web address] - password [email or web address]  [facility] [place] [facility]  Office  [phone]  CC: Primary care physician; System, Pcp Not In

## 2017-06-04 NOTE — Progress Notes (Signed)
Inpatient Diabetes Program Recommendations  AACE/ADA: New Consensus Statement on Inpatient Glycemic Control (2015)  Target Ranges:  Prepandial:   less than 140 mg/dL      Peak postprandial:   less than 180 mg/dL (1-2 hours)      Critically ill patients:  140 - 180 mg/dL   Lab Results  Component Value Date   GLUCAP 233 (H) 06/04/2017   HGBA1C 9.9 (H) 05/28/2017    Review of Glycemic Control  Results for Deirdre PeerROUSE, Kodie W (MRN [id number]) as of 06/04/2017 08:34  Ref. Range 06/03/2017 07:47 06/03/2017 11:34 06/03/2017 16:44 06/03/2017 20:55 06/04/2017 07:42  Glucose-Capillary Latest Ref Range: 65 - 99 mg/dL 409230 (H) 811307 (H) 914290 (H) 314 (H) 233 (H)   Diabetes history:Diabetes diagnosis x 10 years as reported by patient and wife Outpatient Diabetes medications: Glucotrol 5mg  qday, Metformin 1000mg  bid  Current orders for Inpatient glycemic control: Novolog 0-9 units tid, Novolog 0-5 units qhs, Lantus 26 units qday, Novolog 5 units tid  Inpatient Diabetes Program Recommendations:   Consider increasing Lantus to 34 units qday starting today (0.3 units/kg). - fasting blood sugar 233mg /dl   Consider increasing Novolog to 7 units tid with meals (hold if patient eats 50%) once he's no longer NPO- post prandials remain elevated  Susette RacerJulie Abhiraj Dozal, RN, [place], [place]HA, CDE Diabetes Coordinator Inpatient Diabetes Program  [phone] (Team Pager) [phone] [facility] Office) 06/04/2017 8:39 AM

## 2017-06-04 NOTE — Progress Notes (Signed)
TEE report  Full report to follow No evidence of valvular vegetation Mitral valve and aortic valve well visualized There is mild mitral valve regurgitation Moderately calcified aortic valve with suspected moderate aortic valve stenosis Aortic valve area 1.3 cm Intact interatrial septum with negative bubble study No thrombus in the appendage Ejection fraction greater than 55%, normal RV systolic function Minimal descending aorta atherosclerosis Minimal atherosclerosis in the aortic arch  Signed, Dossie Arbourim Mariadelaluz Guggenheim, MD, Ph.D [facility]

## 2017-06-04 NOTE — Progress Notes (Signed)
*  PRELIMINARY RESULTS* Echocardiogram Echocardiogram Transesophageal has been performed.  Cristela BlueHege, Melita Villalona 06/04/2017, 10:57 AM

## 2017-06-04 NOTE — Progress Notes (Signed)
ID E note TEE neg. FU bcx negative. Has received 9  days IV abx Can dc on oral levofloxacin for 10 more days. I can see in clinic if sxs recur.

## 2017-06-04 NOTE — Progress Notes (Signed)
Patient alert and oriented. Awaiting TEE. NPO since midnight. Heart and lung sounds normal. Some lower extremity swelling. Scattered bruising on assessment. Reoriented to call bell system.   Harvie HeckMelanie Danyia Borunda, RN

## 2017-06-04 NOTE — Progress Notes (Signed)
Deirdre PeerBruce W Marcum to be D/C'd Home per MD order.  Discussed with the patient and all questions fully answered.  VSS, Skin clean, dry and intact without evidence of skin break down, no evidence of skin tears noted. IV catheter discontinued intact. Site without signs and symptoms of complications. Dressing and pressure applied.  An After Visit Summary was printed and given to the patient. Patient received prescription.  D/c education completed with patient/family including follow up instructions, medication list, d/c activities limitations if indicated, with other d/c instructions as indicated by MD - patient able to verbalize understanding, all questions fully answered.   Patient instructed to return to ED, call 911, or call MD for any changes in condition.   Patient escorted via WC, and D/C home via private auto.  Harvie HeckMelanie Anabell Swint 06/04/2017 3:52 PM

## 2017-06-05 ENCOUNTER — Encounter: Payer: Self-pay | Admitting: Cardiovascular Disease

## 2017-06-08 ENCOUNTER — Encounter: Payer: Self-pay | Admitting: Certified Registered Nurse Anesthetist

## 2017-06-17 ENCOUNTER — Encounter: Payer: Self-pay | Admitting: Infectious Diseases

## 2017-06-17 DIAGNOSIS — A491 Streptococcal infection, unspecified site: Secondary | ICD-10-CM | POA: Insufficient documentation

## 2017-08-16 ENCOUNTER — Inpatient Hospital Stay: Payer: Medicare PPO

## 2017-08-16 ENCOUNTER — Encounter: Payer: Self-pay | Admitting: Internal Medicine

## 2017-08-16 ENCOUNTER — Inpatient Hospital Stay
Admission: EM | Admit: 2017-08-16 | Discharge: 2017-09-02 | DRG: 368 | Disposition: A | Discharge status: Skilled Nursing Facility | Payer: Medicare PPO | Attending: Internal Medicine | Admitting: Internal Medicine

## 2017-08-16 ENCOUNTER — Emergency Department: Payer: Medicare PPO

## 2017-08-16 DIAGNOSIS — D649 Anemia, unspecified: Secondary | ICD-10-CM | POA: Diagnosis not present

## 2017-08-16 DIAGNOSIS — Z978 Presence of other specified devices: Secondary | ICD-10-CM

## 2017-08-16 DIAGNOSIS — R945 Abnormal results of liver function studies: Secondary | ICD-10-CM | POA: Diagnosis not present

## 2017-08-16 DIAGNOSIS — R578 Other shock: Secondary | ICD-10-CM

## 2017-08-16 DIAGNOSIS — D696 Thrombocytopenia, unspecified: Secondary | ICD-10-CM | POA: Diagnosis not present

## 2017-08-16 DIAGNOSIS — R7989 Other specified abnormal findings of blood chemistry: Secondary | ICD-10-CM

## 2017-08-16 DIAGNOSIS — K920 Hematemesis: Secondary | ICD-10-CM | POA: Diagnosis not present

## 2017-08-16 DIAGNOSIS — E1110 Type 2 diabetes mellitus with ketoacidosis without coma: Secondary | ICD-10-CM | POA: Diagnosis present

## 2017-08-16 DIAGNOSIS — E876 Hypokalemia: Secondary | ICD-10-CM | POA: Diagnosis not present

## 2017-08-16 DIAGNOSIS — Z66 Do not resuscitate: Secondary | ICD-10-CM

## 2017-08-16 DIAGNOSIS — Z7189 Other specified counseling: Secondary | ICD-10-CM | POA: Diagnosis not present

## 2017-08-16 DIAGNOSIS — I468 Cardiac arrest due to other underlying condition: Secondary | ICD-10-CM | POA: Diagnosis present

## 2017-08-16 DIAGNOSIS — Z96653 Presence of artificial knee joint, bilateral: Secondary | ICD-10-CM | POA: Diagnosis present

## 2017-08-16 DIAGNOSIS — R571 Hypovolemic shock: Secondary | ICD-10-CM | POA: Diagnosis present

## 2017-08-16 DIAGNOSIS — Z794 Long term (current) use of insulin: Secondary | ICD-10-CM

## 2017-08-16 DIAGNOSIS — E874 Mixed disorder of acid-base balance: Secondary | ICD-10-CM | POA: Diagnosis present

## 2017-08-16 DIAGNOSIS — Z515 Encounter for palliative care: Secondary | ICD-10-CM | POA: Diagnosis not present

## 2017-08-16 DIAGNOSIS — R6521 Severe sepsis with septic shock: Secondary | ICD-10-CM | POA: Diagnosis present

## 2017-08-16 DIAGNOSIS — K921 Melena: Secondary | ICD-10-CM | POA: Diagnosis not present

## 2017-08-16 DIAGNOSIS — I959 Hypotension, unspecified: Secondary | ICD-10-CM | POA: Diagnosis present

## 2017-08-16 DIAGNOSIS — K31811 Angiodysplasia of stomach and duodenum with bleeding: Secondary | ICD-10-CM | POA: Diagnosis present

## 2017-08-16 DIAGNOSIS — E870 Hyperosmolality and hypernatremia: Secondary | ICD-10-CM | POA: Diagnosis not present

## 2017-08-16 DIAGNOSIS — N5089 Other specified disorders of the male genital organs: Secondary | ICD-10-CM | POA: Diagnosis not present

## 2017-08-16 DIAGNOSIS — K7460 Unspecified cirrhosis of liver: Secondary | ICD-10-CM

## 2017-08-16 DIAGNOSIS — K7200 Acute and subacute hepatic failure without coma: Secondary | ICD-10-CM | POA: Diagnosis present

## 2017-08-16 DIAGNOSIS — Z4659 Encounter for fitting and adjustment of other gastrointestinal appliance and device: Secondary | ICD-10-CM

## 2017-08-16 DIAGNOSIS — Z880 Allergy status to penicillin: Secondary | ICD-10-CM

## 2017-08-16 DIAGNOSIS — R1310 Dysphagia, unspecified: Secondary | ICD-10-CM | POA: Diagnosis not present

## 2017-08-16 DIAGNOSIS — I864 Gastric varices: Secondary | ICD-10-CM | POA: Diagnosis present

## 2017-08-16 DIAGNOSIS — I248 Other forms of acute ischemic heart disease: Secondary | ICD-10-CM | POA: Diagnosis present

## 2017-08-16 DIAGNOSIS — N170 Acute kidney failure with tubular necrosis: Secondary | ICD-10-CM | POA: Diagnosis present

## 2017-08-16 DIAGNOSIS — I469 Cardiac arrest, cause unspecified: Secondary | ICD-10-CM | POA: Diagnosis present

## 2017-08-16 DIAGNOSIS — Z833 Family history of diabetes mellitus: Secondary | ICD-10-CM

## 2017-08-16 DIAGNOSIS — R188 Other ascites: Secondary | ICD-10-CM | POA: Diagnosis present

## 2017-08-16 DIAGNOSIS — J9690 Respiratory failure, unspecified, unspecified whether with hypoxia or hypercapnia: Secondary | ICD-10-CM

## 2017-08-16 DIAGNOSIS — G931 Anoxic brain damage, not elsewhere classified: Secondary | ICD-10-CM | POA: Diagnosis present

## 2017-08-16 DIAGNOSIS — I361 Nonrheumatic tricuspid (valve) insufficiency: Secondary | ICD-10-CM | POA: Diagnosis not present

## 2017-08-16 DIAGNOSIS — Z6841 Body Mass Index (BMI) 40.0 and over, adult: Secondary | ICD-10-CM

## 2017-08-16 DIAGNOSIS — N179 Acute kidney failure, unspecified: Secondary | ICD-10-CM | POA: Diagnosis not present

## 2017-08-16 DIAGNOSIS — G9340 Encephalopathy, unspecified: Secondary | ICD-10-CM | POA: Diagnosis not present

## 2017-08-16 DIAGNOSIS — R140 Abdominal distension (gaseous): Secondary | ICD-10-CM

## 2017-08-16 DIAGNOSIS — J9601 Acute respiratory failure with hypoxia: Secondary | ICD-10-CM | POA: Diagnosis present

## 2017-08-16 DIAGNOSIS — I8501 Esophageal varices with bleeding: Principal | ICD-10-CM | POA: Diagnosis present

## 2017-08-16 DIAGNOSIS — K7581 Nonalcoholic steatohepatitis (NASH): Secondary | ICD-10-CM | POA: Diagnosis present

## 2017-08-16 DIAGNOSIS — D62 Acute posthemorrhagic anemia: Secondary | ICD-10-CM

## 2017-08-16 DIAGNOSIS — D689 Coagulation defect, unspecified: Secondary | ICD-10-CM

## 2017-08-16 DIAGNOSIS — K7290 Hepatic failure, unspecified without coma: Secondary | ICD-10-CM | POA: Diagnosis not present

## 2017-08-16 DIAGNOSIS — R197 Diarrhea, unspecified: Secondary | ICD-10-CM | POA: Diagnosis present

## 2017-08-16 DIAGNOSIS — E872 Acidosis: Secondary | ICD-10-CM | POA: Diagnosis present

## 2017-08-16 DIAGNOSIS — R748 Abnormal levels of other serum enzymes: Secondary | ICD-10-CM | POA: Diagnosis not present

## 2017-08-16 DIAGNOSIS — E875 Hyperkalemia: Secondary | ICD-10-CM | POA: Diagnosis present

## 2017-08-16 DIAGNOSIS — I878 Other specified disorders of veins: Secondary | ICD-10-CM | POA: Diagnosis present

## 2017-08-16 DIAGNOSIS — K922 Gastrointestinal hemorrhage, unspecified: Secondary | ICD-10-CM | POA: Diagnosis not present

## 2017-08-16 DIAGNOSIS — D509 Iron deficiency anemia, unspecified: Secondary | ICD-10-CM | POA: Diagnosis present

## 2017-08-16 LAB — BLOOD GAS, VENOUS
Acid-base deficit: 19.7 mmol/L — ABNORMAL HIGH (ref 0.0–2.0)
Bicarbonate: 8.3 mmol/L — ABNORMAL LOW (ref 20.0–28.0)
O2 Saturation: 48.2 %
Patient temperature: 37
pCO2, Ven: 26 mmHg — ABNORMAL LOW (ref 44.0–60.0)
pH, Ven: 7.11 — CL (ref 7.250–7.430)
pO2, Ven: 37 mmHg (ref 32.0–45.0)

## 2017-08-16 LAB — PREPARE RBC (CROSSMATCH)

## 2017-08-16 LAB — BASIC METABOLIC PANEL
ANION GAP: 27 — AB (ref 5–15)
ANION GAP: 10 (ref 5–15)
ANION GAP: 8 (ref 5–15)
Anion gap: 8 (ref 5–15)
BUN: 89 mg/dL — ABNORMAL HIGH (ref 6–20)
BUN: 96 mg/dL — ABNORMAL HIGH (ref 6–20)
BUN: 96 mg/dL — ABNORMAL HIGH (ref 6–20)
BUN: 96 mg/dL — AB (ref 6–20)
CALCIUM: 7.8 mg/dL — AB (ref 8.9–10.3)
CALCIUM: 8 mg/dL — AB (ref 8.9–10.3)
CHLORIDE: 105 mmol/L (ref 101–111)
CHLORIDE: 112 mmol/L — AB (ref 101–111)
CHLORIDE: 112 mmol/L — AB (ref 101–111)
CO2: 11 mmol/L — AB (ref 22–32)
CO2: 25 mmol/L (ref 22–32)
CO2: 25 mmol/L (ref 22–32)
CO2: 27 mmol/L (ref 22–32)
CREATININE: 2.36 mg/dL — AB (ref 0.61–1.24)
CREATININE: 2.37 mg/dL — AB (ref 0.61–1.24)
Calcium: 8.7 mg/dL — ABNORMAL LOW (ref 8.9–10.3)
Calcium: 8.1 mg/dL — ABNORMAL LOW (ref 8.9–10.3)
Chloride: 111 mmol/L (ref 101–111)
Creatinine, Ser: 2.29 mg/dL — ABNORMAL HIGH (ref 0.61–1.24)
Creatinine, Ser: 2.36 mg/dL — ABNORMAL HIGH (ref 0.61–1.24)
GFR calc non Af Amer: 28 mL/min — ABNORMAL LOW (ref >60)
GFR calc non Af Amer: 27 mL/min — ABNORMAL LOW (ref >60)
GFR calc non Af Amer: 27 mL/min — ABNORMAL LOW (ref >60)
GFR calc non Af Amer: 27 mL/min — ABNORMAL LOW (ref >60)
GFR, EST AFRICAN AMERICAN: 33 mL/min — AB (ref >60)
GFR, EST AFRICAN AMERICAN: 31 mL/min — AB (ref >60)
GFR, EST AFRICAN AMERICAN: 31 mL/min — AB (ref >60)
GFR, EST AFRICAN AMERICAN: 31 mL/min — AB (ref >60)
GLUCOSE: 649 mg/dL — AB (ref 65–99)
GLUCOSE: 348 mg/dL — AB (ref 65–99)
Glucose, Bld: 159 mg/dL — ABNORMAL HIGH (ref 65–99)
Glucose, Bld: 130 mg/dL — ABNORMAL HIGH (ref 65–99)
POTASSIUM: 3.6 mmol/L (ref 3.5–5.1)
Potassium: 4.3 mmol/L (ref 3.5–5.1)
Potassium: 3.9 mmol/L (ref 3.5–5.1)
Potassium: 3.6 mmol/L (ref 3.5–5.1)
SODIUM: 146 mmol/L — AB (ref 135–145)
Sodium: 143 mmol/L (ref 135–145)
Sodium: 147 mmol/L — ABNORMAL HIGH (ref 135–145)
Sodium: 145 mmol/L (ref 135–145)

## 2017-08-16 LAB — GLUCOSE, CAPILLARY
GLUCOSE-CAPILLARY: 125 mg/dL — AB (ref 65–99)
GLUCOSE-CAPILLARY: 128 mg/dL — AB (ref 65–99)
GLUCOSE-CAPILLARY: 235 mg/dL — AB (ref 65–99)
GLUCOSE-CAPILLARY: 421 mg/dL — AB (ref 65–99)
GLUCOSE-CAPILLARY: 433 mg/dL — AB (ref 65–99)
GLUCOSE-CAPILLARY: 465 mg/dL — AB (ref 65–99)
GLUCOSE-CAPILLARY: 499 mg/dL — AB (ref 65–99)
GLUCOSE-CAPILLARY: 526 mg/dL — AB (ref 65–99)
GLUCOSE-CAPILLARY: 529 mg/dL — AB (ref 65–99)
GLUCOSE-CAPILLARY: 67 mg/dL (ref 65–99)
GLUCOSE-CAPILLARY: 91 mg/dL (ref 65–99)
Glucose-Capillary: 477 mg/dL — ABNORMAL HIGH (ref 65–99)
Glucose-Capillary: 494 mg/dL — ABNORMAL HIGH (ref 65–99)
Glucose-Capillary: 423 mg/dL — ABNORMAL HIGH (ref 65–99)
Glucose-Capillary: 493 mg/dL — ABNORMAL HIGH (ref 65–99)
Glucose-Capillary: 370 mg/dL — ABNORMAL HIGH (ref 65–99)
Glucose-Capillary: 388 mg/dL — ABNORMAL HIGH (ref 65–99)
Glucose-Capillary: 340 mg/dL — ABNORMAL HIGH (ref 65–99)
Glucose-Capillary: 185 mg/dL — ABNORMAL HIGH (ref 65–99)
Glucose-Capillary: 135 mg/dL — ABNORMAL HIGH (ref 65–99)
Glucose-Capillary: 140 mg/dL — ABNORMAL HIGH (ref 65–99)
Glucose-Capillary: 157 mg/dL — ABNORMAL HIGH (ref 65–99)

## 2017-08-16 LAB — URINALYSIS, COMPLETE (UACMP) WITH MICROSCOPIC
BILIRUBIN URINE: NEGATIVE
Bacteria, UA: NONE SEEN
Glucose, UA: >=500 mg/dL — AB
HGB URINE DIPSTICK: NEGATIVE
Ketones, ur: 5 mg/dL — AB
LEUKOCYTES UA: NEGATIVE
NITRITE: NEGATIVE
PH: 5 (ref 5.0–8.0)
Protein, ur: NEGATIVE mg/dL
Specific Gravity, Urine: 1.015 (ref 1.005–1.030)

## 2017-08-16 LAB — PHOSPHORUS: Phosphorus: 7.8 mg/dL — ABNORMAL HIGH (ref 2.5–4.6)

## 2017-08-16 LAB — CBC WITH DIFFERENTIAL/PLATELET
BAND NEUTROPHILS: 3 %
BASOS ABS: 0 10*3/uL (ref 0–0.1)
BASOS PCT: 0 %
Blasts: 0 %
EOS PCT: 0 %
Eosinophils Absolute: 0 10*3/uL (ref 0–0.7)
HCT: 20.5 % — ABNORMAL LOW (ref 40.0–52.0)
Hemoglobin: 5.8 g/dL — ABNORMAL LOW (ref 13.0–18.0)
LYMPHS ABS: 2.3 10*3/uL (ref 1.0–3.6)
Lymphocytes Relative: 12 %
MCH: 27.9 pg (ref 26.0–34.0)
MCHC: 28 g/dL — ABNORMAL LOW (ref 32.0–36.0)
MCV: 99.6 fL (ref 80.0–100.0)
METAMYELOCYTES PCT: 0 %
MONO ABS: 0.8 10*3/uL (ref 0.2–1.0)
MONOS PCT: 4 %
MYELOCYTES: 0 %
NEUTROS ABS: 15.9 10*3/uL — AB (ref 1.4–6.5)
Neutrophils Relative %: 81 %
Other: 0 %
PLATELETS: 174 10*3/uL (ref 150–440)
Promyelocytes Absolute: 0 %
RBC: 2.06 MIL/uL — ABNORMAL LOW (ref 4.40–5.90)
RDW: 21.4 % — AB (ref 11.5–14.5)
WBC: 19 10*3/uL — ABNORMAL HIGH (ref 3.8–10.6)
nRBC: 1 /100 WBC — ABNORMAL HIGH

## 2017-08-16 LAB — BLOOD GAS, ARTERIAL
Acid-base deficit: 16.9 mmol/L — ABNORMAL HIGH (ref 0.0–2.0)
BICARBONATE: 12.2 mmol/L — AB (ref 20.0–28.0)
FIO2: 0.5
LHR: 24 {breaths}/min
MECHANICAL RATE: 24
MECHVT: 500 mL
O2 Saturation: 96.2 %
PEEP: 5 cmH2O
PO2 ART: 105 mmHg (ref 83.0–108.0)
Patient temperature: 35.6
pCO2 arterial: 41 mmHg (ref 32.0–48.0)
pH, Arterial: 7.1 — CL (ref 7.350–7.450)

## 2017-08-16 LAB — APTT: APTT: 43 s — AB (ref 24–36)

## 2017-08-16 LAB — CBC
HCT: 29.7 % — ABNORMAL LOW (ref 40.0–52.0)
HCT: 22.9 % — ABNORMAL LOW (ref 40.0–52.0)
Hemoglobin: 9.1 g/dL — ABNORMAL LOW (ref 13.0–18.0)
Hemoglobin: 7.8 g/dL — ABNORMAL LOW (ref 13.0–18.0)
MCH: 28.6 pg (ref 26.0–34.0)
MCH: 29.6 pg (ref 26.0–34.0)
MCHC: 30.7 g/dL — AB (ref 32.0–36.0)
MCHC: 34.2 g/dL (ref 32.0–36.0)
MCV: 93.2 fL (ref 80.0–100.0)
MCV: 86.5 fL (ref 80.0–100.0)
PLATELETS: 151 10*3/uL (ref 150–440)
PLATELETS: 88 10*3/uL — AB (ref 150–440)
RBC: 3.19 MIL/uL — ABNORMAL LOW (ref 4.40–5.90)
RBC: 2.65 MIL/uL — ABNORMAL LOW (ref 4.40–5.90)
RDW: 16.9 % — AB (ref 11.5–14.5)
RDW: 17.6 % — AB (ref 11.5–14.5)
WBC: 25.3 10*3/uL — AB (ref 3.8–10.6)
WBC: 24.1 10*3/uL — ABNORMAL HIGH (ref 3.8–10.6)

## 2017-08-16 LAB — HEPATIC FUNCTION PANEL
ALK PHOS: 109 U/L (ref 38–126)
ALT: 392 U/L — ABNORMAL HIGH (ref 17–63)
AST: 618 U/L — ABNORMAL HIGH (ref 15–41)
Albumin: 2.2 g/dL — ABNORMAL LOW (ref 3.5–5.0)
BILIRUBIN DIRECT: 0.5 mg/dL (ref 0.1–0.5)
BILIRUBIN INDIRECT: 0.8 mg/dL (ref 0.3–0.9)
Total Bilirubin: 1.3 mg/dL — ABNORMAL HIGH (ref 0.3–1.2)
Total Protein: 4.9 g/dL — ABNORMAL LOW (ref 6.5–8.1)

## 2017-08-16 LAB — COMPREHENSIVE METABOLIC PANEL
ALBUMIN: 2.3 g/dL — AB (ref 3.5–5.0)
ALT: 77 U/L — AB (ref 17–63)
AST: 122 U/L — AB (ref 15–41)
Alkaline Phosphatase: 67 U/L (ref 38–126)
Anion gap: 29 — ABNORMAL HIGH (ref 5–15)
BUN: 95 mg/dL — AB (ref 6–20)
CO2: 10 mmol/L — AB (ref 22–32)
CREATININE: 2.36 mg/dL — AB (ref 0.61–1.24)
Calcium: 9.5 mg/dL (ref 8.9–10.3)
Chloride: 103 mmol/L (ref 101–111)
GFR calc Af Amer: 31 mL/min — ABNORMAL LOW (ref >60)
GFR, EST NON AFRICAN AMERICAN: 27 mL/min — AB (ref >60)
GLUCOSE: 584 mg/dL — AB (ref 65–99)
POTASSIUM: 6.2 mmol/L — AB (ref 3.5–5.1)
SODIUM: 142 mmol/L (ref 135–145)
Total Bilirubin: 1.2 mg/dL (ref 0.3–1.2)
Total Protein: 5 g/dL — ABNORMAL LOW (ref 6.5–8.1)

## 2017-08-16 LAB — MRSA PCR SCREENING: MRSA by PCR: NEGATIVE

## 2017-08-16 LAB — PROTIME-INR
INR: 2.84
INR: 2.23
PROTHROMBIN TIME: 29.6 s — AB (ref 11.4–15.2)
Prothrombin Time: 24.5 seconds — ABNORMAL HIGH (ref 11.4–15.2)

## 2017-08-16 LAB — LACTIC ACID, PLASMA
LACTIC ACID, VENOUS: 18.6 mmol/L — AB (ref 0.5–1.9)
Lactic Acid, Venous: 21.9 mmol/L (ref 0.5–1.9)

## 2017-08-16 LAB — LIPASE, BLOOD: LIPASE: 33 U/L (ref 11–51)

## 2017-08-16 LAB — TROPONIN I
TROPONIN I: 0.06 ng/mL — AB (ref <0.03)
Troponin I: 0.45 ng/mL (ref <0.03)
Troponin I: 6.53 ng/mL (ref <0.03)
Troponin I: 16.81 ng/mL (ref <0.03)

## 2017-08-16 LAB — AMMONIA
Ammonia: 177 umol/L — ABNORMAL HIGH (ref 9–35)
Ammonia: 325 umol/L — ABNORMAL HIGH (ref 9–35)

## 2017-08-16 LAB — AMYLASE: Amylase: 145 U/L — ABNORMAL HIGH (ref 28–100)

## 2017-08-16 LAB — CK: Total CK: 171 U/L (ref 49–397)

## 2017-08-16 LAB — MAGNESIUM: MAGNESIUM: 2 mg/dL (ref 1.7–2.4)

## 2017-08-16 LAB — TRIGLYCERIDES: TRIGLYCERIDES: 95 mg/dL (ref <150)

## 2017-08-16 LAB — PROCALCITONIN: Procalcitonin: 0.78 ng/mL

## 2017-08-16 MED ORDER — BISACODYL 10 MG RE SUPP: 10.0000 mg | Freq: Every day | RECTAL | Status: DC | PRN

## 2017-08-16 MED ORDER — METRONIDAZOLE IN NACL 5-0.79 MG/ML-% IV SOLN
500.0000 mg | Freq: Three times a day (TID) | INTRAVENOUS | Status: DC
  Administered 2017-08-16 – 2017-08-17 (×4): 500 mg via INTRAVENOUS
  Filled 2017-08-16 (×6): qty 100

## 2017-08-16 MED ORDER — SENNOSIDES 8.8 MG/5ML PO SYRP
5.0000 mL | ORAL_SOLUTION | Freq: Two times a day (BID) | ORAL | Status: DC | PRN
  Filled 2017-08-16: qty 5

## 2017-08-16 MED ORDER — FENTANYL 2500MCG IN NS 250ML (10MCG/ML) PREMIX INFUSION
25.0000 ug/h | INTRAVENOUS | Status: DC
Start: 2017-08-16 — End: 2017-08-18
  Administered 2017-08-16: 50 ug/h via INTRAVENOUS
  Administered 2017-08-16 – 2017-08-17 (×2): 100 ug/h via INTRAVENOUS
  Administered 2017-08-17: 50 ug/h via INTRAVENOUS
  Filled 2017-08-16 (×3): qty 250

## 2017-08-16 MED ORDER — SODIUM CHLORIDE 0.9 % IV SOLN
1.0000 g | Freq: Once | INTRAVENOUS | Status: AC
  Administered 2017-08-16: 1 g via INTRAVENOUS
  Filled 2017-08-16: qty 10

## 2017-08-16 MED ORDER — ACETAMINOPHEN 650 MG RE SUPP: 650.0000 mg | Freq: Four times a day (QID) | RECTAL | Status: DC | PRN

## 2017-08-16 MED ORDER — SODIUM CHLORIDE 0.9 % IV SOLN
Freq: Once | INTRAVENOUS | Status: AC
  Administered 2017-08-16: 13:00:00 via INTRAVENOUS

## 2017-08-16 MED ORDER — PROPOFOL 1000 MG/100ML IV EMUL: 5.0000 ug/kg/min | INTRAVENOUS | Status: DC

## 2017-08-16 MED ORDER — EPINEPHRINE PF 1 MG/10ML IJ SOSY
PREFILLED_SYRINGE | INTRAMUSCULAR | Status: AC | PRN
  Administered 2017-08-16 (×3): 1 via INTRAVENOUS

## 2017-08-16 MED ORDER — SODIUM CHLORIDE 0.9 % IV SOLN
8.0000 mg/h | INTRAVENOUS | Status: DC
  Administered 2017-08-16 – 2017-08-18 (×5): 8 mg/h via INTRAVENOUS
  Filled 2017-08-16 (×5): qty 80

## 2017-08-16 MED ORDER — CALCIUM CHLORIDE 10 % IV SOLN
INTRAVENOUS | Status: AC | PRN
  Administered 2017-08-16: 1 g via INTRAVENOUS

## 2017-08-16 MED ORDER — SODIUM CHLORIDE 0.9% FLUSH
10.0000 mL | Freq: Two times a day (BID) | INTRAVENOUS | Status: DC
  Administered 2017-08-16 – 2017-08-18 (×4): 10 mL
  Administered 2017-08-19: 30 mL
  Administered 2017-08-19 – 2017-08-22 (×4): 10 mL
  Administered 2017-08-22: 13:00:00 3 mL

## 2017-08-16 MED ORDER — VECURONIUM BROMIDE 10 MG IV SOLR
INTRAVENOUS | Status: AC
  Filled 2017-08-16: qty 10

## 2017-08-16 MED ORDER — SODIUM CHLORIDE 0.9 % IV SOLN
50.0000 ug/h | INTRAVENOUS | Status: DC
  Administered 2017-08-16 – 2017-08-20 (×7): 50 ug/h via INTRAVENOUS
  Filled 2017-08-16 (×18): qty 1

## 2017-08-16 MED ORDER — ACETAMINOPHEN 325 MG PO TABS
650.0000 mg | ORAL_TABLET | Freq: Four times a day (QID) | ORAL | Status: DC | PRN
  Administered 2017-08-16: 650 mg via ORAL
  Filled 2017-08-16: qty 2

## 2017-08-16 MED ORDER — SODIUM BICARBONATE 8.4 % IV SOLN
INTRAVENOUS | Status: AC | PRN
  Administered 2017-08-16: 50 meq via INTRAVENOUS

## 2017-08-16 MED ORDER — SODIUM CHLORIDE 0.9 % IV BOLUS (SEPSIS)
1000.0000 mL | Freq: Once | INTRAVENOUS | Status: AC
Start: 2017-08-16 — End: 2017-08-16
  Administered 2017-08-16: 1000 mL via INTRAVENOUS

## 2017-08-16 MED ORDER — VECURONIUM BROMIDE 10 MG IV SOLR
10.0000 mg | Freq: Once | INTRAVENOUS | Status: AC
  Administered 2017-08-16: 10 mg via INTRAVENOUS

## 2017-08-16 MED ORDER — SODIUM CHLORIDE 0.9 % IV BOLUS (SEPSIS)
1000.0000 mL | Freq: Once | INTRAVENOUS | Status: AC
  Administered 2017-08-16: 1000 mL via INTRAVENOUS

## 2017-08-16 MED ORDER — ETOMIDATE 2 MG/ML IV SOLN
INTRAVENOUS | Status: AC | PRN
  Administered 2017-08-16: 10 mg via INTRAVENOUS

## 2017-08-16 MED ORDER — INSULIN ASPART 100 UNIT/ML ~~LOC~~ SOLN
10.0000 [IU] | Freq: Once | SUBCUTANEOUS | Status: AC
  Administered 2017-08-16: 10 [IU] via INTRAVENOUS
  Filled 2017-08-16: qty 1

## 2017-08-16 MED ORDER — SODIUM CHLORIDE 0.9 % IV SOLN
0.0000 ug/min | INTRAVENOUS | Status: DC
  Administered 2017-08-16: 350 ug/min via INTRAVENOUS
  Administered 2017-08-16 (×3): 400 ug/min via INTRAVENOUS
  Administered 2017-08-16: 300 ug/min via INTRAVENOUS
  Administered 2017-08-16 (×3): 400 ug/min via INTRAVENOUS
  Administered 2017-08-17: 150 ug/min via INTRAVENOUS
  Filled 2017-08-16 (×2): qty 40
  Filled 2017-08-16: qty 4
  Filled 2017-08-16 (×2): qty 40
  Filled 2017-08-16 (×4): qty 4
  Filled 2017-08-16: qty 40
  Filled 2017-08-16: qty 4

## 2017-08-16 MED ORDER — SODIUM CHLORIDE 0.9 % IV SOLN: INTRAVENOUS | Status: DC

## 2017-08-16 MED ORDER — CHLORHEXIDINE GLUCONATE 0.12% ORAL RINSE (MEDLINE KIT)
15.0000 mL | Freq: Two times a day (BID) | OROMUCOSAL | Status: DC
  Administered 2017-08-16 – 2017-08-20 (×8): 15 mL via OROMUCOSAL

## 2017-08-16 MED ORDER — SODIUM CHLORIDE 0.9 % IV SOLN
1.0000 g | Freq: Two times a day (BID) | INTRAVENOUS | Status: DC
  Administered 2017-08-16 – 2017-08-17 (×4): 1 g via INTRAVENOUS
  Filled 2017-08-16 (×7): qty 1

## 2017-08-16 MED ORDER — MIDAZOLAM HCL 2 MG/2ML IJ SOLN
1.0000 mg | INTRAMUSCULAR | Status: AC | PRN
  Administered 2017-08-16 – 2017-08-17 (×3): 2 mg via INTRAVENOUS
  Filled 2017-08-16 (×3): qty 2

## 2017-08-16 MED ORDER — DOPAMINE-DEXTROSE 3.2-5 MG/ML-% IV SOLN
0.0000 ug/kg/min | Freq: Once | INTRAVENOUS | Status: AC
  Administered 2017-08-16: 2 ug/kg/min via INTRAVENOUS

## 2017-08-16 MED ORDER — DEXTROSE 5 % IV SOLN: 1.0000 g | Freq: Two times a day (BID) | INTRAVENOUS | Status: DC

## 2017-08-16 MED ORDER — ORAL CARE MOUTH RINSE
15.0000 mL | OROMUCOSAL | Status: DC
  Administered 2017-08-16 – 2017-08-20 (×39): 15 mL via OROMUCOSAL

## 2017-08-16 MED ORDER — MIDAZOLAM HCL 2 MG/2ML IJ SOLN
1.0000 mg | INTRAMUSCULAR | Status: DC | PRN
  Administered 2017-08-17 (×2): 2 mg via INTRAVENOUS
  Filled 2017-08-16 (×2): qty 2

## 2017-08-16 MED ORDER — DEXTROSE-NACL 5-0.45 % IV SOLN
INTRAVENOUS | Status: DC
  Administered 2017-08-16: 100 mL/h via INTRAVENOUS

## 2017-08-16 MED ORDER — EPINEPHRINE PF 1 MG/10ML IJ SOSY
PREFILLED_SYRINGE | INTRAMUSCULAR | Status: DC | PRN
  Administered 2017-08-16 (×2): 1 via INTRAVENOUS

## 2017-08-16 MED ORDER — VANCOMYCIN HCL IN DEXTROSE 1-5 GM/200ML-% IV SOLN
1000.0000 mg | Freq: Once | INTRAVENOUS | Status: AC
  Administered 2017-08-16: 1000 mg via INTRAVENOUS
  Filled 2017-08-16: qty 200

## 2017-08-16 MED ORDER — RIFAXIMIN 550 MG PO TABS
550.0000 mg | ORAL_TABLET | Freq: Two times a day (BID) | ORAL | Status: DC
  Administered 2017-08-16 (×2): 550 mg via ORAL
  Filled 2017-08-16 (×2): qty 1

## 2017-08-16 MED ORDER — SODIUM BICARBONATE 8.4 % IV SOLN
INTRAVENOUS | Status: DC
  Administered 2017-08-16 (×2): via INTRAVENOUS
  Filled 2017-08-16 (×8): qty 150

## 2017-08-16 MED ORDER — LEVOFLOXACIN IN D5W 750 MG/150ML IV SOLN
750.0000 mg | Freq: Once | INTRAVENOUS | Status: AC
  Administered 2017-08-16: 750 mg via INTRAVENOUS
  Filled 2017-08-16: qty 150

## 2017-08-16 MED ORDER — ONDANSETRON HCL 4 MG PO TABS: 4.0000 mg | ORAL_TABLET | Freq: Four times a day (QID) | ORAL | Status: DC | PRN

## 2017-08-16 MED ORDER — EPINEPHRINE PF 1 MG/10ML IJ SOSY
PREFILLED_SYRINGE | INTRAMUSCULAR | Status: AC
  Filled 2017-08-16: qty 10

## 2017-08-16 MED ORDER — NOREPINEPHRINE BITARTRATE 1 MG/ML IV SOLN
0.0000 ug/min | INTRAVENOUS | Status: DC
  Administered 2017-08-16: 22 ug/min via INTRAVENOUS
  Administered 2017-08-16: 65 ug/min via INTRAVENOUS
  Administered 2017-08-16: 60 ug/min via INTRAVENOUS
  Administered 2017-08-16: 62 ug/min via INTRAVENOUS
  Administered 2017-08-17: 10 ug/min via INTRAVENOUS
  Administered 2017-08-18: 2 ug/min via INTRAVENOUS
  Administered 2017-08-18: 4 ug/min via INTRAVENOUS
  Administered 2017-08-18 – 2017-08-19 (×2): 2 ug/min via INTRAVENOUS
  Filled 2017-08-16 (×6): qty 16

## 2017-08-16 MED ORDER — LACTULOSE 10 GM/15ML PO SOLN
30.0000 g | Freq: Three times a day (TID) | ORAL | Status: DC
  Administered 2017-08-16 (×3): 30 g via ORAL
  Filled 2017-08-16 (×3): qty 60

## 2017-08-16 MED ORDER — RIFAXIMIN 200 MG PO TABS
200.0000 mg | ORAL_TABLET | Freq: Two times a day (BID) | ORAL | Status: AC
  Administered 2017-08-16: 200 mg
  Filled 2017-08-16: qty 1

## 2017-08-16 MED ORDER — SODIUM BICARBONATE 8.4 % IV SOLN
INTRAVENOUS | Status: DC | PRN
  Administered 2017-08-16: 50 meq via INTRAVENOUS

## 2017-08-16 MED ORDER — ONDANSETRON HCL 4 MG/2ML IJ SOLN
4.0000 mg | Freq: Four times a day (QID) | INTRAMUSCULAR | Status: DC | PRN
  Administered 2017-08-16: 4 mg via INTRAVENOUS
  Filled 2017-08-16: qty 2

## 2017-08-16 MED ORDER — LEVOFLOXACIN IN D5W 750 MG/150ML IV SOLN: 750.0000 mg | INTRAVENOUS | Status: DC

## 2017-08-16 MED ORDER — ROCURONIUM BROMIDE 50 MG/5ML IV SOLN
INTRAVENOUS | Status: AC | PRN
  Administered 2017-08-16: 100 mg via INTRAVENOUS

## 2017-08-16 MED ORDER — SODIUM CHLORIDE 0.9 % IV SOLN
INTRAVENOUS | Status: DC
  Administered 2017-08-16: 5.2 [IU]/h via INTRAVENOUS
  Filled 2017-08-16: qty 1

## 2017-08-16 MED ORDER — PHENYLEPHRINE HCL 10 MG/ML IJ SOLN
0.0000 ug/min | Freq: Once | INTRAMUSCULAR | Status: AC
  Administered 2017-08-16: 50 ug/min via INTRAVENOUS
  Filled 2017-08-16 (×2): qty 1

## 2017-08-16 MED ORDER — NOREPINEPHRINE BITARTRATE 1 MG/ML IV SOLN
0.0000 ug/min | Freq: Once | INTRAVENOUS | Status: AC
  Administered 2017-08-16: 5 ug/min via INTRAVENOUS
  Administered 2017-08-16: 20 ug/min via INTRAVENOUS
  Filled 2017-08-16: qty 4

## 2017-08-16 MED ORDER — DEXTROSE 5 % IV SOLN
10.0000 mg | Freq: Once | INTRAVENOUS | Status: AC
  Administered 2017-08-16: 10 mg via INTRAVENOUS
  Filled 2017-08-16: qty 1

## 2017-08-16 MED ORDER — SODIUM BICARBONATE 8.4 % IV SOLN
INTRAVENOUS | Status: AC
  Filled 2017-08-16: qty 50

## 2017-08-16 MED ORDER — SODIUM CHLORIDE 0.9 % IV SOLN
Freq: Once | INTRAVENOUS | Status: AC
  Administered 2017-08-16: 05:00:00 via INTRAVENOUS

## 2017-08-16 MED ORDER — SODIUM CHLORIDE 0.9 % IV SOLN
INTRAVENOUS | Status: DC
  Administered 2017-08-16: 21.8 [IU]/h via INTRAVENOUS
  Administered 2017-08-16: 32.8 [IU]/h via INTRAVENOUS
  Administered 2017-08-16: 16.3 [IU]/h via INTRAVENOUS
  Filled 2017-08-16 (×4): qty 1

## 2017-08-16 MED ORDER — SODIUM CHLORIDE 0.9 % IV SOLN
INTRAVENOUS | Status: DC
  Administered 2017-08-16 (×3): via INTRAVENOUS

## 2017-08-16 MED ORDER — SODIUM CHLORIDE 0.9% FLUSH: 10.0000 mL | INTRAVENOUS | Status: DC | PRN

## 2017-08-16 MED ORDER — SODIUM CHLORIDE 0.9 % IV SOLN: 10.0000 mL/h | Freq: Once | INTRAVENOUS | Status: DC

## 2017-08-16 MED ORDER — SODIUM CHLORIDE 0.9 % IV SOLN
80.0000 mg | Freq: Once | INTRAVENOUS | Status: AC
  Administered 2017-08-16: 80 mg via INTRAVENOUS
  Filled 2017-08-16: qty 80

## 2017-08-16 MED ORDER — AZTREONAM 2 G IJ SOLR
2.0000 g | Freq: Once | INTRAMUSCULAR | Status: AC
  Administered 2017-08-16: 2 g via INTRAVENOUS
  Filled 2017-08-16: qty 2

## 2017-08-16 MED ORDER — DEXTROSE 5 % IV SOLN
0.0000 ug/min | INTRAVENOUS | Status: DC
  Administered 2017-08-16: 65 ug/min via INTRAVENOUS
  Filled 2017-08-16: qty 4

## 2017-08-16 MED ORDER — PROPOFOL 1000 MG/100ML IV EMUL
INTRAVENOUS | Status: AC
  Filled 2017-08-16: qty 100

## 2017-08-16 MED ORDER — VASOPRESSIN 20 UNIT/ML IV SOLN
0.0300 [IU]/min | INTRAVENOUS | Status: DC
  Administered 2017-08-16 – 2017-08-17 (×2): 0.03 [IU]/min via INTRAVENOUS
  Filled 2017-08-16 (×2): qty 2

## 2017-08-16 MED ORDER — FUROSEMIDE 10 MG/ML IJ SOLN
20.0000 mg | Freq: Once | INTRAMUSCULAR | Status: AC
  Administered 2017-08-16: 20 mg via INTRAVENOUS
  Filled 2017-08-16: qty 2

## 2017-08-16 MED ORDER — VANCOMYCIN HCL 10 G IV SOLR
1500.0000 mg | INTRAVENOUS | Status: DC
  Administered 2017-08-16: 1500 mg via INTRAVENOUS
  Filled 2017-08-16 (×2): qty 1500

## 2017-08-16 NOTE — ED Notes (Signed)
See green transfusion sheets issued with each unit of blood for transfusion information, emergency blood release and emergency blood transfusion administered during code blue x2.

## 2017-08-16 NOTE — ED Notes (Signed)
Christine RN, aware of assigned bed

## 2017-08-16 NOTE — Code Documentation (Signed)
Called to room to assist April, RN as pt had stopped breathing and lost his pulse. Chest compressions started and pt being bagged.

## 2017-08-16 NOTE — ED Notes (Addendum)
Pt arrested again, md at bedside, code initiated by this rn.

## 2017-08-16 NOTE — ED Notes (Addendum)
Warm blankets on pt due to hypothermia on arrival.

## 2017-08-16 NOTE — Progress Notes (Signed)
eLink Physician-Brief Progress Note Patient Name: Jesus [name] DOB: Apr 17, 1951 MRN: [id number]   Date of Service  08/16/2017  HPI/Events of Note  Vomited blood  eICU Interventions  stast CBC, rpt coags & correct if needed (3 U FFP given) Vit K x 1     Intervention Category Intermediate Interventions: Bleeding - evaluation and treatment with blood products  Briannie Gutierrez V. 08/16/2017, 7:39 PM

## 2017-08-16 NOTE — Progress Notes (Signed)
Pharmacy Antibiotic Note  KAMANI LEWTER is a 66 y.o. male admitted on 08/16/2017 with sepsis.  Pharmacy has been consulted for meropenem/flagyl dosing.  Plan: Patient received vanc, aztreonam, levaquin in ED  Will discontinue aztreonam and place patient on meropenem 1g IV q12h for broad spectrum coverage of anaerobes and considering patient is critical Will start flagyl 500 mg IV q8h for intra-abdominal source. Will discontinue levaquin as patient does not meet criteria for atypical coverage or double coverage for pseudomonas.  Weight: 272 lb (123.4 kg)  Temp (24hrs), Avg:96.2 F (35.7 C), Min:91.8 F (33.2 C), Max:97.9 F (36.6 C)   Recent Labs Lab 08/16/17 0049 08/16/17 0051  WBC 19.0*  --   CREATININE 2.36*  --   LATICACIDVEN  --  21.9*    Estimated Creatinine Clearance: 39.4 mL/min (A) (by C-G formula based on SCr of 2.36 mg/dL (H)).    Allergies  Allergen Reactions  . Penicillins Hives and Other (See Comments)    Has patient had a PCN reaction causing immediate rash, facial/tongue/throat swelling, SOB or lightheadedness with hypotension: Yes Has patient had a PCN reaction causing severe rash involving mucus membranes or skin necrosis: No Has patient had a PCN reaction that required hospitalization: No Has patient had a PCN reaction occurring within the last 10 years: No If all of the above answers are "NO", then may proceed with Cephalosporin use.     Thank you for allowing pharmacy to be a part of this patient's care.  Thomasene Ripple, PharmD, BCPS Clinical Pharmacist 08/16/2017

## 2017-08-16 NOTE — Progress Notes (Signed)
[facility] [place] at [facility]   PATIENT NAME: Jesus [name]    MR#:  [id number]  DATE OF BIRTH:  [birth year]  SUBJECTIVE:   Patient here due to septic shock and altered mental status and noted to be in multiorgan failure and critically ill. Remains on 3 vasopressors, remains acidotic.    REVIEW OF SYSTEMS:    Review of Systems  Unable to perform ROS: Intubated    Nutrition: NPO Tolerating Diet: No Tolerating PT: Await Eval.   DRUG ALLERGIES:   Allergies  Allergen Reactions  . Penicillins Hives and Other (See Comments)    Has patient had a PCN reaction causing immediate rash, facial/tongue/throat swelling, SOB or lightheadedness with hypotension: Yes Has patient had a PCN reaction causing severe rash involving mucus membranes or skin necrosis: No Has patient had a PCN reaction that required hospitalization: No Has patient had a PCN reaction occurring within the last 10 years: No If all of the above answers are "NO", then may proceed with Cephalosporin use.     VITALS:  Blood pressure (!) 77/45, pulse (!) 136, temperature (!) 101.5 F (38.6 C), resp. rate (!) 24, weight 123.4 kg (272 lb), SpO2 98 %.  PHYSICAL EXAMINATION:   Physical Exam  GENERAL:  66 y.o.-year-old critically ill patient lying intubated & sedated.  EYES: Pupils equal, round, reactive to light but minimal response. No scleral icterus.  HEENT: Head atraumatic, normocephalic. ET and OG tube in place.  NECK:  Supple, no jugular venous distention. No thyroid enlargement, no tenderness.  LUNGS: Normal breath sounds bilaterally, no wheezing, rales, rhonchi. No use of accessory muscles of respiration.  CARDIOVASCULAR: S1, S2 normal. No murmurs, rubs, or gallops.  ABDOMEN: Soft, nontender, nondistended. Bowel sounds present. No organomegaly or mass.  EXTREMITIES: No cyanosis, clubbing or edema b/l.    NEUROLOGIC: sedated & intubated PSYCHIATRIC: Sedated & intubated.   SKIN: No obvious rash,  lesion, or ulcer.    LABORATORY PANEL:   CBC  Recent Labs Lab 08/16/17 0729  WBC 25.3*  HGB 9.1*  HCT 29.7*  PLT 151   ------------------------------------------------------------------------------------------------------------------  Chemistries   Recent Labs Lab 08/16/17 0456  NA 143  K 4.3  CL 105  CO2 11*  GLUCOSE 649*  BUN 89*  CREATININE 2.29*  CALCIUM 8.7*  MG 2.0  AST 618*  ALT 392*  ALKPHOS 109  BILITOT 1.3*   ------------------------------------------------------------------------------------------------------------------  Cardiac Enzymes  Recent Labs Lab 08/16/17 0456  TROPONINI 0.45*   ------------------------------------------------------------------------------------------------------------------  RADIOLOGY:  Dg Chest Port 1 View  Result Date: 08/16/2017 CLINICAL DATA:  Post intubation.  NG tube placement. EXAM: PORTABLE CHEST 1 VIEW COMPARISON:  05/20/2017 FINDINGS: Endotracheal tube 4.4 cm in the carina. Enteric tube in place, tip below the diaphragm beyond the field of view, likely in the stomach. Lung volumes are low. The heart is enlarged. No pulmonary edema, confluent airspace disease, large pleural effusion or pneumothorax. Gallstones incidentally noted in the upper abdomen. IMPRESSION: 1. Endotracheal tube 4.4 cm from the carina. Enteric tube with tip below the diaphragm likely in the stomach, not included in the field of view. 2. Low lung volumes with cardiomegaly. Electronically Signed   By: Rubye Oaks M.D.   On: 08/16/2017 02:07     ASSESSMENT AND PLAN:   66 year old male with past medical history of liver cirrhosis, diabetes, hx of Streptococcus Constellatatus bacteremia, hx of Enterococcal UTI, hx of Hepatic Encephaloapthy who presented to the hospital due to altered mental status and found unresponsive  at home. Upon arrival to the ER patient was in cardiac arrest and resuscitated for 45 minutes in the ER. Critically ill with  multi-organ failure.   1.  S/p Cardiac Arrest - etiology unclear. Suspected to be secondary to GI bleeding/hypoxemia. - trop. Mildly elevated.  - await Echo. Cont. Supportive care.   2. Sepsis - pt. Is severely Hypotensive and remains on 3 pressors.  - source unclear.  Cont. Empiric Vanc, Meropenem.  - follow cultures.    3. Metabolic Acidosis - due to severe hypotension and elevated Lactic Acid.  - cont. Supportive care w/ IV fluids, Vasopressor and follow.   4. Symptomatic Anemia - pt. Presented with Hg. Of 5.3.  - transfused 2 units and Hg. Improved and stable and will cont. To monitor.  - hx of chronic liver disease and ?? Variceal bleeding but no hematemesis noted.  - cont. Octreotide, Protonix gtt. Await GI input.   5. Abnormal LFT's - due to shock liver.  - follow LFT's.   6. Acute Renal failure - ATN due to severe hypotension and sepsis.  - follow REnal function. Has fair renal output. Consider nephro consult if not improving.   7. Leukocytosis - due to sepsis - follow w/ IV abx therapy. Follow cultures.   8. Hepatic Encephalopathy - cont. Xifaxan.   Patient critically ill with multiorgan failure. Prognosis is very poor. Intensivist  talk to the family and they want to continue current aggressive care for now.   All the records are reviewed and case discussed with Care Management/Social Worker. Management plans discussed with the patient, family and they are in agreement.  CODE STATUS: Full code  DVT Prophylaxis: TEd's & SCD's.  TOTAL TIME TAKING CARE OF THIS PATIENT: 30 minutes.   POSSIBLE D/C unclear and depends of pt's progress.   Houston Siren M.D on 08/16/2017 at 12:17 PM  Between 7am to 6pm - Pager - [phone]  After 6pm go to [email or web address] - Social research officer, government  [facility] [place] [facility]  Office  [phone]  CC: Primary care physician; System, Pcp Not In

## 2017-08-16 NOTE — Progress Notes (Signed)
Pharmacy Antibiotic Note  Jesus [name] is a 66 y.o. male admitted on 08/16/2017 with sepsis.  Pharmacy has been consulted for vanc/levaquin/aztreonam dosing.  Plan: Patient received vanc 1g IV and levaquin 750 mg IV and aztreonam 2g IV x 1 in ED  Will f/u w/ vanc 1.5g IV q24h w/ 6 hour stack dose. Will continue levaquin 750 mg IV q48h per CrCl 20 - 49 ml/min. Will start azactam 1g IV q12h  Ke 0.0371 T1/2 18.6 ~ 24 hrs and increase dose to vanc 1.5g IV Goal trough 15 - 20 mcg/mL  Weight: 272 lb (123.4 kg)  Temp (24hrs), Avg:95.9 F (35.5 C), Min:91.8 F (33.2 C), Max:97.9 F (36.6 C)   Recent Labs Lab 08/16/17 0049 08/16/17 0051  WBC 19.0*  --   CREATININE 2.36*  --   LATICACIDVEN  --  21.9*    Estimated Creatinine Clearance: 39.4 mL/min (A) (by C-G formula based on SCr of 2.36 mg/dL (H)).    Allergies  Allergen Reactions  . Penicillins Hives and Other (See Comments)    Has patient had a PCN reaction causing immediate rash, facial/tongue/throat swelling, SOB or lightheadedness with hypotension: Yes Has patient had a PCN reaction causing severe rash involving mucus membranes or skin necrosis: No Has patient had a PCN reaction that required hospitalization: No Has patient had a PCN reaction occurring within the last 10 years: No If all of the above answers are "NO", then may proceed with Cephalosporin use.    Thank you for allowing pharmacy to be a part of this patient's care.  Thomasene Ripple, PharmD, BCPS Clinical Pharmacist 08/16/2017

## 2017-08-16 NOTE — H&P (Addendum)
[facility] - [place] at [facility]   PATIENT NAME: Jesus [name]    MR#:  [id number]  DATE OF BIRTH:  August 11, 1951  DATE OF ADMISSION:  08/16/2017  PRIMARY CARE PHYSICIAN: System, Pcp Not In   REQUESTING/REFERRING PHYSICIAN:   CHIEF COMPLAINT:   Chief Complaint  Patient presents with  . Altered Mental Status  . Hyperglycemia    HISTORY OF PRESENT ILLNESS: Jesus [name]  is a 66 y.o. male with a known history of cirrhosis of liver type 2 diabetes mellitus presented to the emergency room for altered mental status. EMS was called to patient's home by patient's wife after she found him unresponsive.Patient had diarrhea for the last couple of days according to the family member to the information given to the EMS service. Upon arrival in the emergency room patient was completely confused he was agitated.he also had vomited blood. Patient coded twice the emergency room, full acls protocol was ran and patient was revived. Patient intubated and put on ventilator. His hemoglobin was 5.8 and 2 units of PRBC were transfused emergently. Patient was started on IV Levophed drip for support of blood pressure. His lactic acid is elevated and was started on broad-spectrum antibiotics. Intensivist on call was consulted by ER physician and hospitalist service was also consulted. Patient unable to give any history because on ventilator.  PAST MEDICAL HISTORY:   Past Medical History:  Diagnosis Date  . Cirrhosis of liver (HCC)   . Diabetes (HCC)     PAST SURGICAL HISTORY: Past Surgical History:  Procedure Laterality Date  . ROTATOR CUFF REPAIR Right   . TEE WITHOUT CARDIOVERSION N/A 06/04/2017   Procedure: TRANSESOPHAGEAL ECHOCARDIOGRAM (TEE);  Surgeon: Antonieta Iba, MD;  Location: [facility] ORS;  Service: Cardiovascular;  Laterality: N/A;  . TOTAL KNEE ARTHROPLASTY Bilateral     SOCIAL HISTORY:  Social History  Substance Use Topics  . Smoking status: Never Smoker  . Smokeless  tobacco: Never Used  . Alcohol use No    FAMILY HISTORY:  Family History  Problem Relation Age of Onset  . Diabetes Mellitus II Father   . CAD Neg Hx   . Hypertension Neg Hx     DRUG ALLERGIES:  Allergies  Allergen Reactions  . Penicillins Hives and Other (See Comments)    Has patient had a PCN reaction causing immediate rash, facial/tongue/throat swelling, SOB or lightheadedness with hypotension: Yes Has patient had a PCN reaction causing severe rash involving mucus membranes or skin necrosis: No Has patient had a PCN reaction that required hospitalization: No Has patient had a PCN reaction occurring within the last 10 years: No If all of the above answers are "NO", then may proceed with Cephalosporin use.     REVIEW OF SYSTEMS:  Could not be obtained as patient on ventilator.  MEDICATIONS AT HOME:  Prior to Admission medications   Medication Sig Start Date End Date Taking? Authorizing Provider  furosemide (LASIX) 40 MG tablet Take 40 mg by mouth daily.    [provider]  glyBURIDE (DIABETA) 5 MG tablet Take 5 mg by mouth daily. 02/08/07   [provider]  insulin aspart (NOVOLOG) 100 UNIT/ML injection Inject 7 Units into the skin 3 (three) times daily with meals. 06/04/17   Enedina Finner, MD  insulin glargine (LANTUS) 100 UNIT/ML injection Inject 0.34 mLs (34 Units total) into the skin daily. 06/04/17   Enedina Finner, MD  lactulose (CHRONULAC) 10 GM/15ML solution Take 45 mLs (30 g total) by mouth  daily. 06/04/17   Enedina Finner, MD  levofloxacin (LEVAQUIN) 750 MG tablet Take 1 tablet (750 mg total) by mouth daily. 06/04/17   Enedina Finner, MD  metFORMIN (GLUCOPHAGE) 1000 MG tablet Take 1,000 mg by mouth 2 (two) times daily with a meal.    [provider]  pantoprazole (PROTONIX) 40 MG tablet Take 1 tablet (40 mg total) by mouth daily. 06/04/17   Enedina Finner, MD  traZODone (DESYREL) 50 MG tablet Take 75 mg by mouth at bedtime.     [provider]       PHYSICAL EXAMINATION:   VITAL SIGNS: Blood pressure (!) 78/40, pulse (!) 120, temperature (!) 96 F (35.6 C), resp. rate (!) 0, weight 123.4 kg (272 lb), SpO2 (!) 89 %.  GENERAL:  66 y.o.-year-old patient lying in the bed on ventilator.  EYES: Pupils equal, round, reactive to light and accommodation at 3mm. No scleral icterus. Pallor present. Extraocular muscles intact.  HEENT: Head atraumatic, normocephalic. Oropharynx dry and nasopharynx clear.  NECK:  Supple, no jugular venous distention. No thyroid enlargement, no tenderness.  LUNGS: Decreased breath sounds bilaterally, scattered rales heard bilaterally. CARDIOVASCULAR: S1, S2 normal. No murmurs, rubs, or gallops.  ABDOMEN: Soft, nontender, nondistended. Bowel sounds present. No organomegaly or mass.  EXTREMITIES: No pedal edema, cyanosis, or clubbing.  NEUROLOGIC: Not oriented to time, place and person On ventilator PSYCHIATRIC: could not be assessed SKIN: No obvious rash, lesion, or ulcer.   LABORATORY PANEL:   CBC  Recent Labs Lab 08/16/17 0049  WBC 19.0*  HGB 5.8*  HCT 20.5*  PLT 174  MCV 99.6  MCH 27.9  MCHC 28.0*  RDW 21.4*  LYMPHSABS 2.3  MONOABS 0.8  EOSABS 0.0  BASOSABS 0.0   ------------------------------------------------------------------------------------------------------------------  Chemistries   Recent Labs Lab 08/16/17 0049  NA 142  K 6.2*  CL 103  CO2 10*  GLUCOSE 584*  BUN 95*  CREATININE 2.36*  CALCIUM 9.5  AST 122*  ALT 77*  ALKPHOS 67  BILITOT 1.2   ------------------------------------------------------------------------------------------------------------------ estimated creatinine clearance is 39.4 mL/min (A) (by C-G formula based on SCr of 2.36 mg/dL (H)). ------------------------------------------------------------------------------------------------------------------ No results for input(s): TSH, T4TOTAL, T3FREE, THYROIDAB in the last 72 hours.  Invalid input(s):  FREET3   Coagulation profile No results for input(s): INR, PROTIME in the last 168 hours. ------------------------------------------------------------------------------------------------------------------- No results for input(s): DDIMER in the last 72 hours. -------------------------------------------------------------------------------------------------------------------  Cardiac Enzymes  Recent Labs Lab 08/16/17 0049  TROPONINI 0.06*   ------------------------------------------------------------------------------------------------------------------ Invalid input(s): POCBNP  ---------------------------------------------------------------------------------------------------------------  Urinalysis    Component Value Date/Time   COLORURINE YELLOW (A) 08/16/2017 0051   APPEARANCEUR CLEAR (A) 08/16/2017 0051   LABSPEC 1.015 08/16/2017 0051   PHURINE 5.0 08/16/2017 0051   GLUCOSEU >=500 (A) 08/16/2017 0051   HGBUR NEGATIVE 08/16/2017 0051   BILIRUBINUR NEGATIVE 08/16/2017 0051   KETONESUR 5 (A) 08/16/2017 0051   PROTEINUR NEGATIVE 08/16/2017 0051   NITRITE NEGATIVE 08/16/2017 0051   LEUKOCYTESUR NEGATIVE 08/16/2017 0051     RADIOLOGY: Dg Chest Port 1 View  Result Date: 08/16/2017 CLINICAL DATA:  Post intubation.  NG tube placement. EXAM: PORTABLE CHEST 1 VIEW COMPARISON:  05/20/2017 FINDINGS: Endotracheal tube 4.4 cm in the carina. Enteric tube in place, tip below the diaphragm beyond the field of view, likely in the stomach. Lung volumes are low. The heart is enlarged. No pulmonary edema, confluent airspace disease, large pleural effusion or pneumothorax. Gallstones incidentally noted in the upper abdomen. IMPRESSION: 1. Endotracheal tube 4.4 cm from the carina.  Enteric tube with tip below the diaphragm likely in the stomach, not included in the field of view. 2. Low lung volumes with cardiomegaly. Electronically Signed   By: Rubye Oaks M.D.   On: 08/16/2017 02:07     EKG: Orders placed or performed during the hospital encounter of 08/16/17  . EKG 12-Lead  . EKG 12-Lead  . ED EKG 12-Lead  . ED EKG 12-Lead    IMPRESSION AND PLAN: 66 year old male patient with history of cirrhosis of liver, diabetes metastatic to presented to the emergency room with confusion and elevated blood sugar.  Admitting diagnosis 1. Acute cardio respiratory failure 2. Status post cardiac arrest 3. Diabetic ketoacidosis 4. Acute gastrointestinal bleeding 5. Symptomatic anemia 6. Hyperkalemia 7. Altered mental status 8.Sepsis Treatment plan Admit patient to ICU IV fluid hydration Transfuse 2 units PRBC IV Gastroenterology and intensivist consultation Intensivist on-call notified IV insulin drip Follow-up potassium level IV Protonix drip IV Levophed drip for support of blood pressure Start patient on IV vancomycin, IV Aztreonam and IV levaquin antibiotics High risk of morbidity and mortality Overall prognosis poor  All the records are reviewed and case discussed with ED provider. Management plans discussed with the patient, family and they are in agreement.  CODE STATUS:FULL CODE Code Status History    Date Active Date Inactive Code Status Order ID Comments User Context   05/28/2017  5:15 AM 06/04/2017  8:10 PM Full Code [id number]  Arnaldo Natal, MD Inpatient       TOTAL CRITICAL CARE TIME TAKING CARE OF THIS PATIENT: 60 minutes.    Ihor Austin M.D on 08/16/2017 at 3:14 AM  Between 7am to 6pm - Pager - [phone]  After 6pm go to [email or web address] - password [email or web address]  [facility]  Office  [phone]  CC: Primary care physician; System, Pcp Not In

## 2017-08-16 NOTE — Progress Notes (Signed)
eLink Physician-Brief Progress Note Patient Name: Jesus [name] DOB: 1951/01/11 MRN: [id number]   Date of Service  08/16/2017  HPI/Events of Note   41 M with cirrhosis of liver and type 2 diabetes mellitus presented to the emergency room for altered mental status. EMS was called to patient's home after being found  Unresponsive.  Patient had diarrhea for the last couple of days. Upon arrival in the emergency room patient was confused, agitated and vomited blood. He had a blood sugar of greater than 500.  He experienced PEA arrest times two responding to epi and bicarb.  Patient was found to have Hgb of 5.8.  He received 2 units pRBC, was intubated and femoral CVL placed.  He is now on NE gtt. and broad spec ABX.  Lactate was elevated at 22.   Patient is critically ill with HR of 130, BP 73/31 (36), RR 25, Sats of 97%.   He is on 20 mcg DA,  65 mcg NE, insulin.   eICU Interventions  Plans per admitting team including GI consult, protonix gtt, pressor support, insulin and adequate sedation while on vent.   May require aline for HD monitoring Given MMP prognosis is guarded Continue to monitor via ELINK Need to address code status with family     Intervention Category Evaluation Type: New Patient Evaluation  DETERDING,ELIZABETH 08/16/2017, 3:54 AM

## 2017-08-16 NOTE — Consult Note (Signed)
Reason for Consult: cirrhosis, suspected GI bleed Referring Physician: Dr. Donella Stade is an 66 y.o. male.  HPI: Seen in consultation at th request of Dr. Estanislado Pandy for further evaluation of cirrhosis and suspected GI bleed. The history is obtained through the patient's family and review of EPIC. He is intubated and unable to provide any details. GI was consulted because he was found to have coffee ground appearing gastric contents during intubation and unexplained anemia. 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 He is followed at the [place]. He has had an extensive evaluation for anemia over the years including prior EGD, colonoscopy and capsule endoscopy within the last 2 years. No source has been identified despite evidence for occult GI blood loss. He is managed with frequent, standing PRBC transfusions and IV iron. In fact, he was to have a weekly treatment for the last three weeks because his anemia was so severe. During this time, his family was not aware of any overt blood loss or GI symptoms.   The patient was found down by his family at 11:30 pm. There are reports that he was found on the floor, agitated, with some hematemesis.  However, the family denies any hematemesis during my discussion with them. He was transferred to the ED where he experienced cardiac arrest x 2 with prolonged downtime of 40 minutes. He was found to have a hemoglobin of 5.8.   He has remained critically ill since that time with suspected septic shock requiring norephinephrine and neosynephrine and lactic acidosis requiring a bicarb drip. He is intubated. He is on vancomycin, Meropenem, and Flagyl. He received one liter of fluids and 2 units of PRBCs. He has also been started on octreotide and Protonix. He has not had a bowel movement since his arrival.    Past Medical History:  Diagnosis Date   . Cirrhosis of liver ([place])   . Diabetes [facility])     Past Surgical History:  Procedure Laterality Date  . ROTATOR CUFF REPAIR Right   . TEE WITHOUT CARDIOVERSION N/A 06/04/2017   Procedure: TRANSESOPHAGEAL ECHOCARDIOGRAM (TEE);  Surgeon: Minna Merritts, MD;  Location: [facility] ORS;  Service: Cardiovascular;  Laterality: N/A;  . TOTAL KNEE ARTHROPLASTY Bilateral     Family History  Problem Relation Age of Onset  . Diabetes Mellitus II Father   . CAD Neg Hx   . Hypertension Neg Hx     Social History:  reports that he has never smoked. He has never used smokeless tobacco. He reports that he does not drink alcohol or use drugs.  Allergies:  Allergies  Allergen Reactions  . Penicillins Hives and Other (See Comments)    Has patient had a PCN reaction causing immediate rash, facial/tongue/throat swelling, SOB or lightheadedness with hypotension: Yes Has patient had a PCN reaction causing severe rash involving mucus membranes or skin necrosis: No Has patient had a PCN reaction that required hospitalization: No Has patient had a PCN reaction occurring within the last 10 years: No If all of the above answers are "NO", then may proceed with Cephalosporin use.     Medications:  I have reviewed the patient's current medications. Prior to Admission:  Prescriptions Prior to Admission  Medication Sig Dispense Refill Last Dose  . furosemide (LASIX) 40 MG tablet Take 40 mg by mouth daily.   Past Week at Unknown time  . glyBURIDE (DIABETA) 5 MG tablet  Take 5 mg by mouth daily.   Past Week at Unknown time  . insulin aspart (NOVOLOG) 100 UNIT/ML injection Inject 7 Units into the skin 3 (three) times daily with meals. 10 mL 11   . insulin glargine (LANTUS) 100 UNIT/ML injection Inject 0.34 mLs (34 Units total) into the skin daily. 10 mL 11   . lactulose (CHRONULAC) 10 GM/15ML solution Take 45 mLs (30 g total) by mouth daily. 240 mL 0   . levofloxacin (LEVAQUIN) 750 MG tablet Take 1 tablet (750 mg  total) by mouth daily. 3 tablet 0   . metFORMIN (GLUCOPHAGE) 1000 MG tablet Take 1,000 mg by mouth 2 (two) times daily with a meal.   Past Week at unknown  . pantoprazole (PROTONIX) 40 MG tablet Take 1 tablet (40 mg total) by mouth daily. 30 tablet 0   . traZODone (DESYREL) 50 MG tablet Take 75 mg by mouth at bedtime.    Past Week at Unknown time   Scheduled: . lactulose  30 g Oral TID   Continuous: . sodium chloride    . sodium chloride    . sodium chloride 100 mL/hr at 08/16/17 0531  . dextrose 5 % and 0.45% NaCl Stopped (08/16/17 0450)  . fentaNYL infusion INTRAVENOUS 100 mcg/hr (08/16/17 0735)  . insulin (NOVOLIN-R) infusion 21.8 Units/hr (08/16/17 0946)  . meropenem (MERREM) IV Stopped (08/16/17 0729)  . metronidazole Stopped (08/16/17 0800)  . norepinephrine (LEVOPHED) 45m / 2560minfusion 62 mcg/min (08/16/17 0950)  . octreotide  (SANDOSTATIN)    IV infusion 50 mcg/hr (08/16/17 066384 . pantoprozole (PROTONIX) infusion 8 mg/hr (08/16/17 0449)  . phenylephrine (NEO-SYNEPHRINE) Adult infusion 400 mcg/min (08/16/17 0721)  . propofol Stopped (08/16/17 0406)  .  sodium bicarbonate  infusion 1000 mL 200 mL/hr at 08/16/17 0440  . vancomycin    . vasopressin (PITRESSIN) infusion - *FOR SHOCK* 0.03 Units/min (08/16/17 066659  PRDJT:[id number]*OR** acetaminophen, bisacodyl, EPINEPHrine, midazolam, midazolam, ondansetron **OR** ondansetron (ZOFRAN) IV, sennosides, sodium bicarbonate  Results for orders placed or performed during the hospital encounter of 08/16/17 (from the past 48 hour(s))  Glucose, capillary     Status: Abnormal   Collection Time: 08/16/17 12:46 AM  Result Value Ref Range   Glucose-Capillary 525 (HH) 65 - 99 mg/dL   Comment 1 Notify RN    Comment 2 Repeat Test   Glucose, capillary     Status: Abnormal   Collection Time: 08/16/17 12:48 AM  Result Value Ref Range   Glucose-Capillary 529 (HH) 65 - 99 mg/dL  Comprehensive metabolic panel     Status: Abnormal    Collection Time: 08/16/17 12:49 AM  Result Value Ref Range   Sodium 142 135 - 145 mmol/L    Comment: ELECTROLYTES REPETEAD.PMH   Potassium 6.2 (H) 3.5 - 5.1 mmol/L   Chloride 103 101 - 111 mmol/L   CO2 10 (L) 22 - 32 mmol/L   Glucose, Bld 584 (HH) 65 - 99 mg/dL    Comment: CRITICAL RESULT CALLED TO, READ BACK BY AND VERIFIED WITH DR. PAThe DallesT 0151 08/16/17.PMH   BUN 95 (H) 6 - 20 mg/dL   Creatinine, Ser 2.36 (H) 0.61 - 1.24 mg/dL   Calcium 9.5 8.9 - 10.3 mg/dL   Total Protein 5.0 (L) 6.5 - 8.1 g/dL   Albumin 2.3 (L) 3.5 - 5.0 g/dL   AST 122 (H) 15 - 41 U/L   ALT 77 (H) 17 - 63 U/L   Alkaline Phosphatase 67 38 - 126 U/L  Total Bilirubin 1.2 0.3 - 1.2 mg/dL   GFR calc non Af Amer 27 (L) >60 mL/min   GFR calc Af Amer 31 (L) >60 mL/min    Comment: (NOTE) The eGFR has been calculated using the CKD EPI equation. This calculation has not been validated in all clinical situations. eGFR's persistently <60 mL/min signify possible Chronic Kidney Disease.    Anion gap 29 (H) 5 - 15  CBC WITH DIFFERENTIAL     Status: Abnormal   Collection Time: 08/16/17 12:49 AM  Result Value Ref Range   WBC 19.0 (H) 3.8 - 10.6 K/uL   RBC 2.06 (L) 4.40 - 5.90 MIL/uL   Hemoglobin 5.8 (L) 13.0 - 18.0 g/dL    Comment: RESULT REPEATED AND VERIFIED   HCT 20.5 (L) 40.0 - 52.0 %    Comment: RESULT REPEATED AND VERIFIED   MCV 99.6 80.0 - 100.0 fL   MCH 27.9 26.0 - 34.0 pg   MCHC 28.0 (L) 32.0 - 36.0 g/dL   RDW 21.4 (H) 11.5 - 14.5 %   Platelets 174 150 - 440 K/uL   Neutrophils Relative % 81 %   Lymphocytes Relative 12 %   Monocytes Relative 4 %   Eosinophils Relative 0 %   Basophils Relative 0 %   Band Neutrophils 3 %   Metamyelocytes Relative 0 %   Myelocytes 0 %   Promyelocytes Absolute 0 %   Blasts 0 %   nRBC 1 (H) 0 /100 WBC   Other 0 %   Neutro Abs 15.9 (H) 1.4 - 6.5 K/uL   Lymphs Abs 2.3 1.0 - 3.6 K/uL   Monocytes Absolute 0.8 0.2 - 1.0 K/uL   Eosinophils Absolute 0.0 0 - 0.7 K/uL    Basophils Absolute 0.0 0 - 0.1 K/uL   RBC Morphology MIXED RBC POPULATION     Comment: POLYCHROMASIA PRESENT  Troponin I     Status: Abnormal   Collection Time: 08/16/17 12:49 AM  Result Value Ref Range   Troponin I 0.06 (HH) <0.03 ng/mL    Comment: CRITICAL RESULT CALLED TO, READ BACK BY AND VERIFIED WITH DR. [place] AT 0151 08/16/17.PMH  CK     Status: None   Collection Time: 08/16/17 12:49 AM  Result Value Ref Range   Total CK 171 49 - 397 U/L  Lactic acid, plasma     Status: Abnormal   Collection Time: 08/16/17 12:51 AM  Result Value Ref Range   Lactic Acid, Venous 21.9 (HH) 0.5 - 1.9 mmol/L    Comment: RESULT CONFIRMED BY MANUAL DILUTION.PMH CRITICAL RESULT CALLED TO, READ BACK BY AND VERIFIED WITH CHRISTINE MARTIN AT 0200 08/16/17.PMH   Blood gas, venous ([facility], [facility], [facility])     Status: Abnormal   Collection Time: 08/16/17 12:51 AM  Result Value Ref Range   pH, Ven 7.11 (LL) 7.250 - 7.430    Comment: CRITICAL RESULT, NOTIFIED PHYSICIAN DR [place] [id number] DT    pCO2, Ven 26 (L) 44.0 - 60.0 mmHg   pO2, Ven 37.0 32.0 - 45.0 mmHg   Bicarbonate 8.3 (L) 20.0 - 28.0 mmol/L   Acid-base deficit 19.7 (H) 0.0 - 2.0 mmol/L   O2 Saturation 48.2 %   Patient temperature 37.0    Collection site LINE    Sample type VENOUS   Urinalysis, Complete w Microscopic     Status: Abnormal   Collection Time: 08/16/17 12:51 AM  Result Value Ref Range   Color, Urine YELLOW (A) YELLOW   APPearance CLEAR (  A) CLEAR   Specific Gravity, Urine 1.015 1.005 - 1.030   pH 5.0 5.0 - 8.0   Glucose, UA >=500 (A) NEGATIVE mg/dL   Hgb urine dipstick NEGATIVE NEGATIVE   Bilirubin Urine NEGATIVE NEGATIVE   Ketones, ur 5 (A) NEGATIVE mg/dL   Protein, ur NEGATIVE NEGATIVE mg/dL   Nitrite NEGATIVE NEGATIVE   Leukocytes, UA NEGATIVE NEGATIVE   RBC / HPF 0-5 0 - 5 RBC/hpf   WBC, UA 0-5 0 - 5 WBC/hpf   Bacteria, UA NONE SEEN NONE SEEN   Squamous Epithelial / LPF 0-5 (A) NONE SEEN   Hyaline Casts, UA  PRESENT   Ammonia     Status: Abnormal   Collection Time: 08/16/17 12:51 AM  Result Value Ref Range   Ammonia 177 (H) 9 - 35 umol/L  Type and screen [facility]     Status: None (Preliminary result)   Collection Time: 08/16/17  1:38 AM  Result Value Ref Range   ABO/RH(D) O POS    Antibody Screen NEG    Sample Expiration 08/19/2017    Unit Number [id number]    Blood Component Type RBC LR PHER2    Unit division 00    Status of Unit ISSUED    Transfusion Status OK TO TRANSFUSE    Crossmatch Result COMPATIBLE    Unit Number [id number]    Blood Component Type RED CELLS,LR    Unit division 00    Status of Unit ISSUED    Transfusion Status OK TO TRANSFUSE    Crossmatch Result COMPATIBLE    Unit Number [id number]    Blood Component Type RBC, LR IRR    Unit division 00    Status of Unit ISSUED    Transfusion Status OK TO TRANSFUSE    Crossmatch Result Compatible    Unit Number [id number]    Blood Component Type RED CELLS,LR    Unit division 00    Status of Unit ALLOCATED    Transfusion Status OK TO TRANSFUSE    Crossmatch Result Compatible   Prepare RBC     Status: None   Collection Time: 08/16/17  2:00 AM  Result Value Ref Range   Order Confirmation ORDER PROCESSED BY BLOOD BANK   Blood gas, arterial     Status: Abnormal (Preliminary result)   Collection Time: 08/16/17  2:47 AM  Result Value Ref Range   FIO2 0.50    Mode ASSIST CONTROL    VT 500 mL   Peep/cpap 5.0 cm H20   pH, Arterial PENDING 7.350 - 7.450   pCO2 arterial 61 (H) 32.0 - 48.0 mmHg   pO2, Arterial 78 (L) 83.0 - 108.0 mmHg   Bicarbonate 14.4 (L) 20.0 - 28.0 mmol/L   Acid-base deficit 16.2 (H) 0.0 - 2.0 mmol/L   O2 Saturation 85.4 %   Patient temperature 37.0    Collection site LEFT RADIAL    Sample type ARTERIAL DRAW    Allens test (pass/fail) PASS PASS   Mechanical Rate 20   Glucose, capillary     Status: Abnormal   Collection Time: 08/16/17  3:25 AM  Result Value Ref  Range   Glucose-Capillary 465 (H) 65 - 99 mg/dL  Glucose, capillary     Status: Abnormal   Collection Time: 08/16/17  3:42 AM  Result Value Ref Range   Glucose-Capillary 477 (H) 65 - 99 mg/dL  Prepare RBC     Status: None   Collection Time: 08/16/17  4:05 AM  Result Value  Ref Range   Order Confirmation ORDER PROCESSED BY BLOOD BANK   Lactic acid, plasma     Status: Abnormal   Collection Time: 08/16/17  4:22 AM  Result Value Ref Range   Lactic Acid, Venous 18.6 (HH) 0.5 - 1.9 mmol/L    Comment: RESULT CONFIRMED BY MANUAL DILUTION...[place] CRITICAL RESULT CALLED TO, READ BACK BY AND VERIFIED WITH BRITTON LEE RUST CHESTER AT 0600 ON 08/16/17 [place].   Procalcitonin - Baseline     Status: None   Collection Time: 08/16/17  4:22 AM  Result Value Ref Range   Procalcitonin 0.78 ng/mL    Comment:        Interpretation: PCT > 0.5 ng/mL and <= 2 ng/mL: Systemic infection (sepsis) is possible, but other conditions are known to elevate PCT as well. (NOTE)         ICU PCT Algorithm               Non ICU PCT Algorithm    ----------------------------     ------------------------------         PCT < 0.25 ng/mL                 PCT < 0.1 ng/mL     Stopping of antibiotics            Stopping of antibiotics       strongly encouraged.               strongly encouraged.    ----------------------------     ------------------------------       PCT level decrease by               PCT < 0.25 ng/mL       >= 80% from peak PCT       OR PCT 0.25 - 0.5 ng/mL          Stopping of antibiotics                                             encouraged.     Stopping of antibiotics           encouraged.    ----------------------------     ------------------------------       PCT level decrease by              PCT >= 0.25 ng/mL       < 80% from peak PCT        AND PCT >= 0.5 ng/mL             Continuing antibiotics                                              encouraged.       Continuing antibiotics             encouraged.    ----------------------------     ------------------------------     PCT level increase compared          PCT > 0.5 ng/mL         with peak PCT AND          PCT >= 0.5 ng/mL             Escalation of antibiotics  strongly encouraged.      Escalation of antibiotics        strongly encouraged.   Glucose, capillary     Status: Abnormal   Collection Time: 08/16/17  4:51 AM  Result Value Ref Range   Glucose-Capillary 526 (HH) 65 - 99 mg/dL  Basic metabolic panel     Status: Abnormal   Collection Time: 08/16/17  4:56 AM  Result Value Ref Range   Sodium 143 135 - 145 mmol/L    Comment: LYTES REPEATED. [facility].   Potassium 4.3 3.5 - 5.1 mmol/L   Chloride 105 101 - 111 mmol/L   CO2 11 (L) 22 - 32 mmol/L   Glucose, Bld 649 (HH) 65 - 99 mg/dL    Comment: RESULT CONFIRMED BY MANUAL DILUTION. TCH. CRITICAL RESULT CALLED TO, READ BACK BY AND VERIFIED WITH BRITTON LEE RUSTCHESTER @ [phone] 08/15/17 BY [facility]    BUN 89 (H) 6 - 20 mg/dL   Creatinine, Ser 2.29 (H) 0.61 - 1.24 mg/dL   Calcium 8.7 (L) 8.9 - 10.3 mg/dL   GFR calc non Af Amer 28 (L) >60 mL/min   GFR calc Af Amer 33 (L) >60 mL/min    Comment: (NOTE) The eGFR has been calculated using the CKD EPI equation. This calculation has not been validated in all clinical situations. eGFR's persistently <60 mL/min signify possible Chronic Kidney Disease.    Anion gap 27 (H) 5 - 15  Troponin I     Status: Abnormal   Collection Time: 08/16/17  4:56 AM  Result Value Ref Range   Troponin I 0.45 (HH) <0.03 ng/mL    Comment: CRITICAL RESULT CALLED TO, READ BACK BY AND VERIFIED WITH BRITTON LEE RUSTCHESTER @ [phone] 08/15/17 BY [facility]   MRSA PCR Screening     Status: None   Collection Time: 08/16/17  4:56 AM  Result Value Ref Range   MRSA by PCR NEGATIVE NEGATIVE    Comment:        The GeneXpert MRSA Assay (FDA approved for NASAL specimens only), is one component of a comprehensive MRSA  colonization surveillance program. It is not intended to diagnose MRSA infection nor to guide or monitor treatment for MRSA infections.   Amylase     Status: Abnormal   Collection Time: 08/16/17  4:56 AM  Result Value Ref Range   Amylase 145 (H) 28 - 100 U/L  Hepatic function panel     Status: Abnormal   Collection Time: 08/16/17  4:56 AM  Result Value Ref Range   Total Protein 4.9 (L) 6.5 - 8.1 g/dL   Albumin 2.2 (L) 3.5 - 5.0 g/dL   AST 618 (H) 15 - 41 U/L   ALT 392 (H) 17 - 63 U/L   Alkaline Phosphatase 109 38 - 126 U/L   Total Bilirubin 1.3 (H) 0.3 - 1.2 mg/dL   Bilirubin, Direct 0.5 0.1 - 0.5 mg/dL   Indirect Bilirubin 0.8 0.3 - 0.9 mg/dL  Lipase, blood     Status: None   Collection Time: 08/16/17  4:56 AM  Result Value Ref Range   Lipase 33 11 - 51 U/L  Glucose, capillary     Status: Abnormal   Collection Time: 08/16/17  6:01 AM  Result Value Ref Range   Glucose-Capillary 499 (H) 65 - 99 mg/dL   Comment 1 Notify RN    Comment 2 Document in Chart   Blood gas, arterial     Status: Abnormal   Collection Time: 08/16/17  6:30 AM  Result Value Ref Range   FIO2 0.50    Delivery systems VENTILATOR    Mode PRESSURE REGULATED VOLUME CONTROL    VT 500 mL   LHR 24 resp/min   Peep/cpap 5.0 cm H20   pH, Arterial 7.10 (LL) 7.350 - 7.450    Comment: RBV, TUKOV,MAG. CMM 0655   pCO2 arterial 41 32.0 - 48.0 mmHg   pO2, Arterial 105 83.0 - 108.0 mmHg   Bicarbonate 12.2 (L) 20.0 - 28.0 mmol/L   Acid-base deficit 16.9 (H) 0.0 - 2.0 mmol/L   O2 Saturation 96.2 %   Patient temperature 35.6    Collection site RIGHT RADIAL    Sample type ARTERIAL DRAW    Allens test (pass/fail) PASS PASS   Mechanical Rate 24   Glucose, capillary     Status: Abnormal   Collection Time: 08/16/17  6:50 AM  Result Value Ref Range   Glucose-Capillary 494 (H) 65 - 99 mg/dL   Comment 1 Notify RN    Comment 2 Document in Chart     Dg Chest Port 1 View  Result Date: 08/16/2017 CLINICAL DATA:  Post  intubation.  NG tube placement. EXAM: PORTABLE CHEST 1 VIEW COMPARISON:  05/20/2017 FINDINGS: Endotracheal tube 4.4 cm in the carina. Enteric tube in place, tip below the diaphragm beyond the field of view, likely in the stomach. Lung volumes are low. The heart is enlarged. No pulmonary edema, confluent airspace disease, large pleural effusion or pneumothorax. Gallstones incidentally noted in the upper abdomen. IMPRESSION: 1. Endotracheal tube 4.4 cm from the carina. Enteric tube with tip below the diaphragm likely in the stomach, not included in the field of view. 2. Low lung volumes with cardiomegaly. Electronically Signed   By: Jeb Levering M.D.   On: 08/16/2017 02:07    Review of Systems  Unable to perform ROS: Intubated   Blood pressure (!) 106/41, pulse (!) 137, temperature (!) 97.3 F (36.3 C), resp. rate (!) 26, weight 272 lb (123.4 kg), SpO2 100 %. Physical Exam  Constitutional:  Intubated and sedated. Appears older than his stated age. No stigmata of chronic liver disease. The canister of gastric contents shows no black, red, or maroon output. It appears to be dark brown bile.  HENT:  Head: Normocephalic and atraumatic.  Mouth/Throat: No oropharyngeal exudate.  Eyes: Conjunctivae are normal. No scleral icterus.  Neck: Neck supple. No thyromegaly present.  Cardiovascular:  Tachycardic  Respiratory: He has no wheezes.  Coarse upper airway sounds  GI: Soft. Bowel sounds are normal. He exhibits no distension and no mass. There is no tenderness. There is no rebound and no guarding.  Central obesity. No obvious fluid wave. Unable to assess for hepatosplenomegaly given his body habitus.  Musculoskeletal: He exhibits no deformity.  Woody changes to his LE with some focal ulceration.   Neurological:  Intubated and sedated. Unable to assess.  Skin:  No palmar erythema or spider angioma  Psychiatric:  Unable to assess    Assessment/Plan: Cardiorespiratory failure s/p PEA arrest x  2 Acute renal insufficiency Diabetic ketoacidosis Anemia with possible acute GI blood loss    - received 2 units of PRBCs    - on empiric Protonix and octreotide Chronic iron deficiency anemia    - endoscopic evaluation at the [place] within the last 2 years    - receives standing PRBC transfusions and IV iron    - requiring weekly infusions recently due to progressive anemia Cirrhosis with history of encephalopathy, no known varices Abnormal  liver enzymes - in a pattern consistent with shock liver  Critically ill at this time having sustained PEA arrest within the last six hours. Acute on chronic anemia noted without observed overt GI blood loss, with recent progression in severity of his anemia. He has had a complete evaluation for sources of chronic GI blood loss at the [place].    Elevated transaminases are in a pattern consistent with shock liver.  Recommend continued therapy with empiric octreotide, PPI, and empiric antibiotics.  Would like to obtain his records from the [place] to better understand his anemia and his prior GI evaluation. No endoscopic evaluation planned at this time. Will reassess timing of endoscopy when he clinically stabilizes if it remains appropriate after [facility].   I discussed his care and my recommendations at length with multiple family members including his wife and children.  Thank you for allowing me to participate in Mr. Dellis's care. Please call with any questions or concerns.  We will follow with you and make additional recommendations as new results are available.   [place] 08/16/2017, 7:12 AM

## 2017-08-16 NOTE — ED Provider Notes (Addendum)
[facility] Emergency Department Provider Note  Time seen: 12:49 AM  I have reviewed the triage vital signs and the nursing notes.   HISTORY  Chief Complaint Altered Mental Status and Hyperglycemia    HPI Jesus [name] is a 66 y.o. male With a past medical history of cirrhosis, diabetes, presents to the emergency department with altered mental status. According to EMS family reports the patient has been experiencing diarrhea over the past several days, wife found the patient to be somewhat unresponsive and altered tonight which is what prompted the EMS phone call. Upon arrival the patient is very confused, moaning attempting to take off his cardiac monitor leads, somewhat agitated but will answer specific questions such as are you hurting. Can follow very simple commands at times.awaiting family arrival for further history.  Past Medical History:  Diagnosis Date  . Cirrhosis of liver (HCC)   . Diabetes [facility])     Patient Active Problem List   Diagnosis Date Noted  . 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 Sepsis (HCC) 05/28/2017    Past Surgical History:  Procedure Laterality Date  . ROTATOR CUFF REPAIR Right   . TEE WITHOUT CARDIOVERSION N/A 06/04/2017   Procedure: TRANSESOPHAGEAL ECHOCARDIOGRAM (TEE);  Surgeon: Antonieta Iba, MD;  Location: [facility] ORS;  Service: Cardiovascular;  Laterality: N/A;  . TOTAL KNEE ARTHROPLASTY Bilateral     Prior to Admission medications   Medication Sig Start Date End Date Taking? Authorizing Provider  furosemide (LASIX) 40 MG tablet Take 40 mg by mouth daily.    [provider]  glyBURIDE (DIABETA) 5 MG tablet Take 5 mg by mouth daily. 02/08/07   [provider]  insulin aspart (NOVOLOG) 100 UNIT/ML injection Inject 7 Units into the skin 3 (three) times daily with meals. 06/04/17   Enedina Finner, MD  insulin glargine (LANTUS) 100 UNIT/ML injection Inject 0.34 mLs (34 Units total) into the  skin daily. 06/04/17   Enedina Finner, MD  lactulose (CHRONULAC) 10 GM/15ML solution Take 45 mLs (30 g total) by mouth daily. 06/04/17   Enedina Finner, MD  levofloxacin (LEVAQUIN) 750 MG tablet Take 1 tablet (750 mg total) by mouth daily. 06/04/17   Enedina Finner, MD  metFORMIN (GLUCOPHAGE) 1000 MG tablet Take 1,000 mg by mouth 2 (two) times daily with a meal.    [provider]  pantoprazole (PROTONIX) 40 MG tablet Take 1 tablet (40 mg total) by mouth daily. 06/04/17   Enedina Finner, MD  traZODone (DESYREL) 50 MG tablet Take 75 mg by mouth at bedtime.     [provider]    Allergies  Allergen Reactions  . Penicillins Hives and Other (See Comments)    Has patient had a PCN reaction causing immediate rash, facial/tongue/throat swelling, SOB or lightheadedness with hypotension: Yes Has patient had a PCN reaction causing severe rash involving mucus membranes or skin necrosis: No Has patient had a PCN reaction that required hospitalization: No Has patient had a PCN reaction occurring within the last 10 years: No If all of the above answers are "NO", then may proceed with Cephalosporin use.     Family History  Problem Relation Age of Onset  . Diabetes Mellitus II Father     Social History Social History  Substance Use Topics  . Smoking status: Never Smoker  . Smokeless tobacco: Never Used  . Alcohol use No    Review of Systems unable to obtain an adequate source accurate review of systems due to  altered mental status.  ____________________________________________   PHYSICAL EXAM:  VITAL SIGNS: ED Triage Vitals  Enc Vitals Group     BP 08/16/17 0046 (!) 75/52     Pulse Rate 08/16/17 0045 (!) 120     Resp 08/16/17 0045 (!) 26     Temp --      Temp src --      SpO2 08/16/17 0043 100 %     Weight 08/16/17 0045 272 lb (123.4 kg)     Height --      Head Circumference --      Peak Flow --      Pain Score --      Pain Loc --      Pain Edu? --      Excl. in GC? --      Constitutional: alert, confused, occasionally will answer a simple yes or no question and follow a simple commands but for the most part does not answer questions or follow commands. Eyes: Normal exam ENT   Head: Normocephalic and atraumatic.   Mouth/Throat: dry mucous membranes Cardiovascular: regular rhythm, rate room 120. Respiratory: moderate tachypnea, breath sounds are clear and equal bilaterally. No obvious wheezes rales or rhonchi. Gastrointestinal: soft, no distention. No obvious tenderness on exam. Musculoskeletal: Nontender with normal range of motion in all extremities.  Neurologic:  Normal speech and language. No gross focal neurologic deficits Skin:  Skin is warm, dry and intact.  Psychiatric: Mood and affect are normal.   ____________________________________________    EKG  EKG reviewed and interpreted by myself shows sinus tachycardia 118 bpm, narrow QRS, normal axis, normal intervals, nonspecific ST changes. No ST elevation noted.  ____________________________________________    RADIOLOGY  IMPRESSION: 1. Endotracheal tube 4.4 cm from the carina. Enteric tube with tip below the diaphragm likely in the stomach, not included in the field of view. 2. Low lung volumes with cardiomegaly.  ____________________________________________   INITIAL IMPRESSION / ASSESSMENT AND PLAN / ED COURSE  Pertinent labs & imaging results that were available during my care of the patient were reviewed by me and considered in my medical decision making (see chart for details).  patient presents to the emergency department with altered mental status. Per EMS report family states he has been expressing diarrhea over the past several days. EMS states greater than 600 fingerstick. 528 fingerstick in the emergency department with tachycardia, tachypnea. Blood pressure is low we are rechecking the initial value of 75/52. We will check labs, chest x-ray, begin administering IV  fluids while awaiting results. At this time the patient is adequately protecting his airway, but we will continue to closely monitor.  ----------------------------------------- 2:21 AM on 08/16/2017 -----------------------------------------  Shortly after my initial evaluation the nurse noted the patient to have agonal respirations and lost his pulse. CPR was initiated for approximate 5 minutes with medications given before return of spontaneous circulation. At this time the patient's labs have resulted showing a lactic acid of 21, ammonia 177 with a hemoglobin of 5.8. 2 units of emergency release blood were ordered.  patient was intubated during code by myself. Once a G-tube was inserted patient noted to have black gastric contents. Patient's potassium is 6.2 with a glucose greater than 508 pH of 7.1. 10 units of insulin aspart were given via IV, patient started on insulin drip.  shortly after these medications are started the patient arrested once again for approximately 5-10 minutes. Medications were given and the patient had return of spontaneous circulation. I placed  a left femoral central catheter. We will start norepinephrine given the patient's continued hypotension. Patient is receiving fluids. patient receiving blood products. Patient is on insulin drip. Holding off on norepinephrine at this time as the patient's map is currently 60 with an upper arm blood pressure reading, which I believe to be accurate. Patient continues to have a good pulse. We will admit the patient to the hospital for further treatment. Given the patient's elevated white blood cell count heart rate and lactic acid meeting sepsis criteria we will also order broad-spectrum antibiotics.   CRITICAL CARE Performed by: Minna Antis   Total critical care time: 90 minutes  Critical care time was exclusive of separately billable procedures and treating other patients.  Critical care was necessary to treat or prevent  imminent or life-threatening deterioration.  Critical care was time spent personally by me on the following activities: development of treatment plan with patient and/or surrogate as well as nursing, discussions with consultants, evaluation of patient's response to treatment, examination of patient, obtaining history from patient or surrogate, ordering and performing treatments and interventions, ordering and review of laboratory studies, ordering and review of radiographic studies, pulse oximetry and re-evaluation of patient's condition.  INTUBATION Performed by: Minna Antis  Required items: required blood products, implants, devices, and special equipment available Patient identity confirmed: provided demographic data and hospital-assigned identification number Time out: Immediately prior to procedure a "time out" was called to verify the correct patient, procedure, equipment, support staff and site/side marked as required.  Indications: cardiac arrest/resp arrest  Intubation method: 4.0 Glidescope Laryngoscopy   Preoxygenation: 100% BVM  Sedatives:  etomidate Paralytic:  Rocuronium  Tube Size: 8.0 cuffed  Post-procedure assessment: chest rise and ETCO2 monitor Breath sounds: equal and absent over the epigastrium Tube secured with: ETT holder Chest x-ray interpreted by radiologist and me.  Chest x-ray findings: endotracheal tube in appropriate position  Patient tolerated the procedure well with no immediate complications.  CENTRAL LINE Performed by: Minna Antis Consent: The procedure was performed in an emergent situation. Required items: required blood products, implants, devices, and special equipment available Patient identity confirmed: arm band and provided demographic data Time out: Immediately prior to procedure a "time out" was called to verify the correct patient, procedure, equipment, support staff and site/side marked as required. Indications:  vascular access Anesthesia: local infiltration Local anesthetic: lidocaine 1% with epinephrine Anesthetic total: 3 ml Patient sedated: no Preparation: skin prepped with 2% chlorhexidine Skin prep agent dried: skin prep agent completely dried prior to procedure Sterile barriers: all five maximum sterile barriers used - cap, mask, sterile gown, sterile gloves, and large sterile sheet Hand hygiene: hand hygiene performed prior to central venous catheter insertion  Location details: Left Femoral vein  Catheter type: triple lumen Catheter size: 8 Fr Pre-procedure: landmarks identified Ultrasound guidance: Yes Successful placement: yes Post-procedure: line sutured and dressing applied Assessment: blood return through all parts, free fluid flow Patient tolerance: Patient tolerated the procedure well with no immediate complications.   ----------------------------------------- 3:14 AM on 08/16/2017 -----------------------------------------  Patient maxed out on levothyroid. We will add dopamine. Patient will be transported to the ICU shortly.    ____________________________________________   FINAL CLINICAL IMPRESSION(S) / ED DIAGNOSES  sepsis Cardiac arrest Diabetic ketoacidosis Symptomatic anemia GI bleed    Minna Antis, MD 08/16/17 [id number]    Minna Antis, MD 08/16/17 [phone]

## 2017-08-16 NOTE — Progress Notes (Signed)
Dr. Lonn Georgia aware of elevated Troponin. No new orders given. Will continue to monitor patient.

## 2017-08-16 NOTE — ED Notes (Signed)
[facility]

## 2017-08-16 NOTE — Code Documentation (Signed)
Pt has pulse at this time 

## 2017-08-16 NOTE — Progress Notes (Signed)
CH was introduced to the family by the [facility]. The family gave a history to the [facility] that has led them to this place and time. CH provided presence as the MD updated the family on the current status. CH offered prayer and assured the family spiritual care is available 24/7. CH is available for follow up as needed.    08/16/17 0900  Clinical Encounter Type  Visited With Family;Health care provider  Visit Type Initial;Spiritual support  Referral From Chaplain  Consult/Referral To Chaplain  Spiritual Encounters  Spiritual Needs Prayer;Emotional

## 2017-08-16 NOTE — Progress Notes (Signed)
Pulmonary/critical care  Procedure note Arterial line placement Consent is obtained Time out/procedural positive performed Complete contact barrier precautions utilized Performed under ultrasound guidance Initially radial arterial line was attempted under direct visualization The needle entered the radial artery however minimal flash was noted and unable to feed guidewire. Subsequently switched to the right femoral approach Area cleansed thoroughly Under ultrasound guidance the right femoral artery was cannulated without difficulty Guidewire was placed removing the needle Catheter over guidewire was inserted The guidewire was removed intact, bright red pulsatile blood noted Connected to transducer catheter with appropriate arterial waveform noted Sutured into place  [employer], D.O.

## 2017-08-16 NOTE — ED Triage Notes (Signed)
Pt arrives from home with several days of diarrhea, altered mental status. Pt pale, moaning, ems states blood sugar "high".

## 2017-08-16 NOTE — Consult Note (Addendum)
[facility] Critical Care Medicine Consultation    SYNOPSIS   Status post cardiac arrest with prolonged downtime, non-shockable rhythm, respiratory failure, liver failure, renal failure, shock [name] multiple pressors  ASSESSMENT/PLAN   Status post cardiac arrest 2 with prolonged downtime at 40 minutes. Patient had a witnessed arrest, no shockable rhythm was noted, we'll not perform targeted temperature management. Concerning however for anoxic brain injury. When patient's hemodynamics become more stable will send for CT scan of the head and obtain EEG  Shock. Most likely septic, last measured ejection fraction was preserved, EKG does not show evidence of STEMI, some nonspecific ST-T wave changes noted. Patient is [name] norepinephrine and Neo-Synephrine. If unable to wean pressors will add vasopressin in addition. [name] broad-spectrum antibiotic coverage, to include vancomycin, Meropenem and Flagyl. Patient has been adequately volume resuscitated has received a total of 10 L to include transfusion. Pending post transfusion CBC  Acid-based derangement. Patient with anion gap of 29, correcting for serum albumin delta gap is approximately 20, calculated starting bicarbonate of 30 consistent with an initial metabolic alkalosis and subsequent anion gap metabolic acidosis, based [name] Winters formula there is superimposed respiratory acidosis consistent with triple-based disorder. These changes are most likely explained by lactic acidosis, post cardiac arrest, with suppressed respiratory drive. Patient is presently [name] a bicarbonate infusion, will repeat arterial blood gas to see where we are and adjust accordingly  Leukocytosis. Panculture broad-spectrum coverage  Severe anemia. Patient status post transfusion of 2 units packed RBCs pending posttransfusion H&H. Patient has had a history of iron deficiency anemia, did have hematemesis and per family had black stools. When patient's status is more hemodynamically  stable GI will need to evaluate for possible endoscopy. If he has acute aggressive bleeding will need to do emergently treated in the meanwhile we'll place [name] Protonix and octreotide  Lactic acidosis. Most likely secondary to hypoperfusion although patient with liver failure, impaired Cory cycle and lactic acid clearance. At this point supporting hemodynamics and follow-up lactic acid  Hepatic encephalopathy. Patient's ammonia level was 177, started [name] lactulose and rifaximin  Renal failure. Patient's BUN/creatinine is 95 over 2.36. Will follow-up BMP, may require hemodialysis,/CRRT  Hyperkalemia. Patient has received bicarbonate, insulin, calcium, will follow-up BMP  Discussion with family and reviewing the events, related concerns of post cardiac arrest anoxic brain injury. [facility] consult neurology.  Critical care time one hour, status post cardiac arrest, septic shock, respiratory failure, renal failure  Name: Jesus [name] MRN: [id number] DOB: [birth year]    ADMISSION DATE:  08/16/2017 CONSULTATION DATE:  08/16/2017  REFERRING MD :  Hospitalist Service  CHIEF COMPLAINT:  Status post cardiac arrest   HISTORY OF PRESENT ILLNESS:  Jesus [name] is a 66 year old gentleman with a past medical history remarkable for diabetes and cirrhosis, iron deficiency anemia, status post multiple iron transfusions. His family states that he had difficulty with his last line transfusion. This time he developed diarrhea posttransfusion. Family found him yesterday 11:30 PM confused, [name] the floor, agitated and some hematemesis. EMS was called and he was brought into the emergency department were unfortunately he suffered cardiac arrest 2 with prolonged downtime and 40 minutes. His initial hemoglobin was noted to be 5.8 and received 2 units of packed red blood cells. He was intubated, central line was placed, started [name] norepinephrine and dopamine and transferred to the intensive care unit. In the ICU he is  unresponsive, intubated, [name] multiple pressors and bicarbonate infusion.  PAST MEDICAL HISTORY :  Past Medical History:  Diagnosis Date  . Cirrhosis of liver (HCC)   . Diabetes [facility])    Past Surgical History:  Procedure Laterality Date  . ROTATOR CUFF REPAIR Right   . TEE WITHOUT CARDIOVERSION N/A 06/04/2017   Procedure: TRANSESOPHAGEAL ECHOCARDIOGRAM (TEE);  Surgeon: Antonieta Iba, MD;  Location: [facility] ORS;  Service: Cardiovascular;  Laterality: N/A;  . TOTAL KNEE ARTHROPLASTY Bilateral    Prior to Admission medications   Medication Sig Start Date End Date Taking? Authorizing Provider  furosemide (LASIX) 40 MG tablet Take 40 mg by mouth daily.    [provider]  glyBURIDE (DIABETA) 5 MG tablet Take 5 mg by mouth daily. 02/08/07   [provider]  insulin aspart (NOVOLOG) 100 UNIT/ML injection Inject 7 Units into the skin 3 (three) times daily with meals. 06/04/17   Enedina Finner, MD  insulin glargine (LANTUS) 100 UNIT/ML injection Inject 0.34 mLs (34 Units total) into the skin daily. 06/04/17   Enedina Finner, MD  lactulose (CHRONULAC) 10 GM/15ML solution Take 45 mLs (30 g total) by mouth daily. 06/04/17   Enedina Finner, MD  levofloxacin (LEVAQUIN) 750 MG tablet Take 1 tablet (750 mg total) by mouth daily. 06/04/17   Enedina Finner, MD  metFORMIN (GLUCOPHAGE) 1000 MG tablet Take 1,000 mg by mouth 2 (two) times daily with a meal.    [provider]  pantoprazole (PROTONIX) 40 MG tablet Take 1 tablet (40 mg total) by mouth daily. 06/04/17   Enedina Finner, MD  traZODone (DESYREL) 50 MG tablet Take 75 mg by mouth at bedtime.     [provider]   Allergies  Allergen Reactions  . Penicillins Hives and Other (See Comments)    Has patient had a PCN reaction causing immediate rash, facial/tongue/throat swelling, SOB or lightheadedness with hypotension: Yes Has patient had a PCN reaction causing severe rash involving mucus membranes or skin necrosis: No Has patient had a PCN  reaction that required hospitalization: No Has patient had a PCN reaction occurring within the last 10 years: No If all of the above answers are "NO", then may proceed with Cephalosporin use.     FAMILY HISTORY:  Family History  Problem Relation Age of Onset  . Diabetes Mellitus II Father   . CAD Neg Hx   . Hypertension Neg Hx    SOCIAL HISTORY:  reports that he has never smoked. He has never used smokeless tobacco. He reports that he does not drink alcohol or use drugs.  REVIEW OF SYSTEMS:   Unable to obtain review of systems at this time secondary to present clinical situation  VITAL SIGNS: Temp:  [91.8 F (33.2 C)-97.9 F (36.6 C)] 95.9 F (35.5 C) (09/16 0324) Pulse Rate:  [52-135] 124 (09/16 0324) Resp:  [0-30] 24 (09/16 0324) BP: (51-125)/(18-97) 85/46 (09/16 0324) SpO2:  [89 %-100 %] 97 % (09/16 0352) FiO2 (%):  [50 %] 50 % (09/16 0352) Weight:  [123.4 kg (272 lb)] 123.4 kg (272 lb) (09/16 0045) HEMODYNAMICS:   VENTILATOR SETTINGS: Vent Mode: PRVC FiO2 (%):  [50 %] 50 % Set Rate:  [20 bmp-24 bmp] 24 bmp Vt Set:  [500 mL] 500 mL PEEP:  [5 cmH20] 5 cmH20 Plateau Pressure:  [19 cmH20] 19 cmH20 INTAKE / OUTPUT:  Intake/Output Summary (Last 24 hours) at 08/16/17 0521 Last data filed at 08/16/17 0323  Gross per 24 hour  Intake          10455.2 ml  Output  0 ml  Net          10455.2 ml    Physical Examination:   VS: BP (!) 85/46   Pulse (!) 124   Temp (!) 95.9 F (35.5 C)   Resp (!) 24   Wt 123.4 kg (272 lb)   SpO2 97%   BMI 41.36 kg/m   General Appearance: Unresponsive, orally intubated [name] multiple pressors Neuro: Patient is presently unresponsive, is breathing spontaneously, pupils mid and sluggish, no gag at this time. HEENT: Endotracheal tube noted, trachea is midline, no deviation, no accessory muscle utilization  Pulmonary: Coarse rhonchi appreciated right greater than left Cardiovascular tachycardia noted [name] monitor, regular rhythm  ;   Abdomen: no bowel sounds appreciated, somewhat distended  Skin:    lower extremity edema with discoloration, consistent with chronic venous stasis disease  Extremities: no cyanosis, clubbing   LABS: Reviewed   LABORATORY PANEL:   CBC  Recent Labs Lab 08/16/17 0049  WBC 19.0*  HGB 5.8*  HCT 20.5*  PLT 174    Chemistries   Recent Labs Lab 08/16/17 0049  NA 142  K 6.2*  CL 103  CO2 10*  GLUCOSE 584*  BUN 95*  CREATININE 2.36*  CALCIUM 9.5  AST 122*  ALT 77*  ALKPHOS 67  BILITOT 1.2     Recent Labs Lab 08/16/17 0046 08/16/17 0048 08/16/17 0325 08/16/17 0342 08/16/17 0451  GLUCAP 525* 529* 465* 477* 526*    Recent Labs Lab 08/16/17 0247  PHART PENDING  PCO2ART 61*  PO2ART 78*    Recent Labs Lab 08/16/17 0049  AST 122*  ALT 77*  ALKPHOS 67  BILITOT 1.2  ALBUMIN 2.3*    Cardiac Enzymes  Recent Labs Lab 08/16/17 0049  TROPONINI 0.06*    RADIOLOGY:  Dg Chest Port 1 View  Result Date: 08/16/2017 CLINICAL DATA:  Post intubation.  NG tube placement. EXAM: PORTABLE CHEST 1 VIEW COMPARISON:  05/20/2017 FINDINGS: Endotracheal tube 4.4 cm in the carina. Enteric tube in place, tip below the diaphragm beyond the field of view, likely in the stomach. Lung volumes are low. The heart is enlarged. No pulmonary edema, confluent airspace disease, large pleural effusion or pneumothorax. Gallstones incidentally noted in the upper abdomen. IMPRESSION: 1. Endotracheal tube 4.4 cm from the carina. Enteric tube with tip below the diaphragm likely in the stomach, not included in the field of view. 2. Low lung volumes with cardiomegaly. Electronically Signed   By: Rubye Oaks M.D.   [name]: 08/16/2017 02:07     Tora Kindred, DO 08/16/2017, 5:21 AM

## 2017-08-17 ENCOUNTER — Inpatient Hospital Stay: Payer: Medicare PPO

## 2017-08-17 ENCOUNTER — Encounter
Admission: EM | Disposition: A | Discharge status: Skilled Nursing Facility | Payer: Self-pay | Source: Home / Self Care | Attending: Internal Medicine

## 2017-08-17 ENCOUNTER — Inpatient Hospital Stay: Admit: 2017-08-17 | Payer: Medicare PPO

## 2017-08-17 ENCOUNTER — Encounter: Payer: Self-pay | Admitting: Anesthesiology

## 2017-08-17 DIAGNOSIS — N179 Acute kidney failure, unspecified: Secondary | ICD-10-CM

## 2017-08-17 DIAGNOSIS — J9601 Acute respiratory failure with hypoxia: Secondary | ICD-10-CM

## 2017-08-17 DIAGNOSIS — Z515 Encounter for palliative care: Secondary | ICD-10-CM

## 2017-08-17 DIAGNOSIS — K920 Hematemesis: Secondary | ICD-10-CM

## 2017-08-17 DIAGNOSIS — K31811 Angiodysplasia of stomach and duodenum with bleeding: Secondary | ICD-10-CM

## 2017-08-17 DIAGNOSIS — Z7189 Other specified counseling: Secondary | ICD-10-CM

## 2017-08-17 DIAGNOSIS — K7581 Nonalcoholic steatohepatitis (NASH): Secondary | ICD-10-CM

## 2017-08-17 DIAGNOSIS — K921 Melena: Secondary | ICD-10-CM

## 2017-08-17 DIAGNOSIS — K922 Gastrointestinal hemorrhage, unspecified: Secondary | ICD-10-CM

## 2017-08-17 DIAGNOSIS — R578 Other shock: Secondary | ICD-10-CM

## 2017-08-17 DIAGNOSIS — K7460 Unspecified cirrhosis of liver: Secondary | ICD-10-CM

## 2017-08-17 DIAGNOSIS — I8501 Esophageal varices with bleeding: Principal | ICD-10-CM

## 2017-08-17 HISTORY — PX: ESOPHAGOGASTRODUODENOSCOPY (EGD) WITH PROPOFOL: SHX5813

## 2017-08-17 LAB — BLOOD GAS, ARTERIAL
ACID-BASE DEFICIT: 3.5 mmol/L — AB (ref 0.0–2.0)
Bicarbonate: 21.4 mmol/L (ref 20.0–28.0)
FIO2: 0.35
MECHANICAL RATE: 24
O2 Saturation: 94.4 %
PEEP: 5 cmH2O
PH ART: 7.37 (ref 7.350–7.450)
Patient temperature: 37
RATE: 24 resp/min
VT: 500 mL
pCO2 arterial: 37 mmHg (ref 32.0–48.0)
pO2, Arterial: 75 mmHg — ABNORMAL LOW (ref 83.0–108.0)

## 2017-08-17 LAB — GLUCOSE, CAPILLARY
GLUCOSE-CAPILLARY: 163 mg/dL — AB (ref 65–99)
GLUCOSE-CAPILLARY: 189 mg/dL — AB (ref 65–99)
GLUCOSE-CAPILLARY: 284 mg/dL — AB (ref 65–99)
GLUCOSE-CAPILLARY: 323 mg/dL — AB (ref 65–99)
Glucose-Capillary: 172 mg/dL — ABNORMAL HIGH (ref 65–99)
Glucose-Capillary: 208 mg/dL — ABNORMAL HIGH (ref 65–99)
Glucose-Capillary: 296 mg/dL — ABNORMAL HIGH (ref 65–99)
Glucose-Capillary: 313 mg/dL — ABNORMAL HIGH (ref 65–99)
Glucose-Capillary: 525 mg/dL (ref 65–99)

## 2017-08-17 LAB — CBC
HCT: 21.3 % — ABNORMAL LOW (ref 40.0–52.0)
HCT: 21.9 % — ABNORMAL LOW (ref 40.0–52.0)
HEMATOCRIT: 22.6 % — AB (ref 40.0–52.0)
HEMOGLOBIN: 7.3 g/dL — AB (ref 13.0–18.0)
Hemoglobin: 7.4 g/dL — ABNORMAL LOW (ref 13.0–18.0)
Hemoglobin: 7.5 g/dL — ABNORMAL LOW (ref 13.0–18.0)
MCH: 28.7 pg (ref 26.0–34.0)
MCH: 29.3 pg (ref 26.0–34.0)
MCH: 29.5 pg (ref 26.0–34.0)
MCHC: 33.4 g/dL (ref 32.0–36.0)
MCHC: 33.9 g/dL (ref 32.0–36.0)
MCHC: 34.2 g/dL (ref 32.0–36.0)
MCV: 85.7 fL (ref 80.0–100.0)
MCV: 85.9 fL (ref 80.0–100.0)
MCV: 87.1 fL (ref 80.0–100.0)
PLATELETS: 41 10*3/uL — AB (ref 150–440)
PLATELETS: 66 10*3/uL — AB (ref 150–440)
Platelets: 33 10*3/uL — ABNORMAL LOW (ref 150–440)
RBC: 2.49 MIL/uL — ABNORMAL LOW (ref 4.40–5.90)
RBC: 2.51 MIL/uL — ABNORMAL LOW (ref 4.40–5.90)
RBC: 2.63 MIL/uL — AB (ref 4.40–5.90)
RDW: 16.8 % — ABNORMAL HIGH (ref 11.5–14.5)
RDW: 17.7 % — ABNORMAL HIGH (ref 11.5–14.5)
RDW: 17.9 % — AB (ref 11.5–14.5)
WBC: 10 10*3/uL (ref 3.8–10.6)
WBC: 18 10*3/uL — ABNORMAL HIGH (ref 3.8–10.6)
WBC: 7.9 10*3/uL (ref 3.8–10.6)

## 2017-08-17 LAB — BASIC METABOLIC PANEL
ANION GAP: 8 (ref 5–15)
Anion gap: 9 (ref 5–15)
BUN: 97 mg/dL — AB (ref 6–20)
BUN: 96 mg/dL — AB (ref 6–20)
CALCIUM: 7.8 mg/dL — AB (ref 8.9–10.3)
CALCIUM: 7.6 mg/dL — AB (ref 8.9–10.3)
CHLORIDE: 110 mmol/L (ref 101–111)
CO2: 23 mmol/L (ref 22–32)
CO2: 25 mmol/L (ref 22–32)
CREATININE: 2.33 mg/dL — AB (ref 0.61–1.24)
CREATININE: 2.3 mg/dL — AB (ref 0.61–1.24)
Chloride: 112 mmol/L — ABNORMAL HIGH (ref 101–111)
GFR calc Af Amer: 32 mL/min — ABNORMAL LOW (ref >60)
GFR calc non Af Amer: 27 mL/min — ABNORMAL LOW (ref >60)
GFR, EST AFRICAN AMERICAN: 32 mL/min — AB (ref >60)
GFR, EST NON AFRICAN AMERICAN: 28 mL/min — AB (ref >60)
GLUCOSE: 228 mg/dL — AB (ref 65–99)
Glucose, Bld: 178 mg/dL — ABNORMAL HIGH (ref 65–99)
Potassium: 3.5 mmol/L (ref 3.5–5.1)
Potassium: 3.9 mmol/L (ref 3.5–5.1)
SODIUM: 144 mmol/L (ref 135–145)
Sodium: 143 mmol/L (ref 135–145)

## 2017-08-17 LAB — FERRITIN: Ferritin: 292 ng/mL (ref 24–336)

## 2017-08-17 LAB — BPAM FFP
BLOOD PRODUCT EXPIRATION DATE: 201809212359
Blood Product Expiration Date: 201809212359
Blood Product Expiration Date: 201809212359
ISSUE DATE / TIME: 201809161624
ISSUE DATE / TIME: 201809161215
ISSUE DATE / TIME: 201809161215
UNIT TYPE AND RH: 5100
UNIT TYPE AND RH: 9500
Unit Type and Rh: 5100

## 2017-08-17 LAB — PREPARE FRESH FROZEN PLASMA
UNIT DIVISION: 0
Unit division: 0
Unit division: 0

## 2017-08-17 LAB — URINE CULTURE

## 2017-08-17 LAB — PHOSPHORUS: PHOSPHORUS: 2.6 mg/dL (ref 2.5–4.6)

## 2017-08-17 LAB — FIBRINOGEN: Fibrinogen: 172 mg/dL — ABNORMAL LOW (ref 210–475)

## 2017-08-17 LAB — MAGNESIUM
MAGNESIUM: 1.3 mg/dL — AB (ref 1.7–2.4)
MAGNESIUM: 1.4 mg/dL — AB (ref 1.7–2.4)

## 2017-08-17 LAB — PROTIME-INR
INR: 2.08
Prothrombin Time: 23.2 seconds — ABNORMAL HIGH (ref 11.4–15.2)

## 2017-08-17 LAB — PROCALCITONIN: PROCALCITONIN: 3.93 ng/mL

## 2017-08-17 LAB — IRON AND TIBC
Iron: 35 ug/dL — ABNORMAL LOW (ref 45–182)
Saturation Ratios: 16 % — ABNORMAL LOW (ref 17.9–39.5)
TIBC: 221 ug/dL — AB (ref 250–450)
UIBC: 186 ug/dL

## 2017-08-17 LAB — PREPARE RBC (CROSSMATCH)

## 2017-08-17 LAB — TROPONIN I: Troponin I: 5.41 ng/mL (ref <0.03)

## 2017-08-17 LAB — AMMONIA: AMMONIA: 78 umol/L — AB (ref 9–35)

## 2017-08-17 LAB — LACTIC ACID, PLASMA: LACTIC ACID, VENOUS: 4.1 mmol/L — AB (ref 0.5–1.9)

## 2017-08-17 SURGERY — ESOPHAGOGASTRODUODENOSCOPY (EGD) WITH PROPOFOL
Anesthesia: General

## 2017-08-17 MED ORDER — VITAMIN K1 10 MG/ML IJ SOLN
10.0000 mg | Freq: Every day | INTRAMUSCULAR | Status: AC
  Administered 2017-08-17 – 2017-08-19 (×3): 10 mg via SUBCUTANEOUS
  Filled 2017-08-17 (×3): qty 1

## 2017-08-17 MED ORDER — SODIUM CHLORIDE 0.9 % IV BOLUS (SEPSIS)
1000.0000 mL | Freq: Once | INTRAVENOUS | Status: AC
  Administered 2017-08-17: 1000 mL via INTRAVENOUS

## 2017-08-17 MED ORDER — ROCURONIUM BROMIDE 50 MG/5ML IV SOLN
50.0000 mg | Freq: Once | INTRAVENOUS | Status: AC
  Administered 2017-08-17: 50 mg via INTRAVENOUS
  Filled 2017-08-17: qty 1

## 2017-08-17 MED ORDER — ACETAMINOPHEN 650 MG RE SUPP: 650.0000 mg | Freq: Four times a day (QID) | RECTAL | Status: DC | PRN

## 2017-08-17 MED ORDER — ACETAMINOPHEN 325 MG PO TABS: 650.0000 mg | ORAL_TABLET | Freq: Four times a day (QID) | ORAL | Status: DC | PRN

## 2017-08-17 MED ORDER — INSULIN ASPART 100 UNIT/ML ~~LOC~~ SOLN
0.0000 [IU] | SUBCUTANEOUS | Status: DC
  Administered 2017-08-17: 15 [IU] via SUBCUTANEOUS
  Administered 2017-08-17: 11 [IU] via SUBCUTANEOUS
  Administered 2017-08-17: 4 [IU] via SUBCUTANEOUS
  Administered 2017-08-17: 7 [IU] via SUBCUTANEOUS
  Administered 2017-08-17: 15 [IU] via SUBCUTANEOUS
  Administered 2017-08-18: 4 [IU] via SUBCUTANEOUS
  Administered 2017-08-18 (×2): 7 [IU] via SUBCUTANEOUS
  Administered 2017-08-18 – 2017-08-19 (×5): 4 [IU] via SUBCUTANEOUS
  Administered 2017-08-19: 3 [IU] via SUBCUTANEOUS
  Administered 2017-08-19 (×2): 4 [IU] via SUBCUTANEOUS
  Administered 2017-08-20: 3 [IU] via SUBCUTANEOUS
  Administered 2017-08-20: 4 [IU] via SUBCUTANEOUS
  Filled 2017-08-17 (×19): qty 1

## 2017-08-17 MED ORDER — SODIUM CHLORIDE 0.9 % IV SOLN
Freq: Once | INTRAVENOUS | Status: AC
  Administered 2017-08-17: 01:00:00 via INTRAVENOUS

## 2017-08-17 MED ORDER — MAGNESIUM SULFATE 4 GM/100ML IV SOLN
4.0000 g | Freq: Once | INTRAVENOUS | Status: AC
  Administered 2017-08-17: 4 g via INTRAVENOUS
  Filled 2017-08-17: qty 100

## 2017-08-17 MED ORDER — SODIUM CHLORIDE 0.9 % IV BOLUS (SEPSIS)
1000.0000 mL | INTRAVENOUS | Status: AC
  Administered 2017-08-17 (×2): 1000 mL via INTRAVENOUS

## 2017-08-17 MED ORDER — SODIUM CHLORIDE 0.9 % IV SOLN
INTRAVENOUS | Status: DC
  Administered 2017-08-17: 03:00:00 via INTRAVENOUS

## 2017-08-17 MED ORDER — SODIUM CHLORIDE 0.9 % IV SOLN
Freq: Once | INTRAVENOUS | Status: AC
  Administered 2017-08-17: 22:00:00 via INTRAVENOUS

## 2017-08-17 MED ORDER — SODIUM CHLORIDE 0.9 % IV SOLN
0.0000 ug/min | INTRAVENOUS | Status: DC
  Filled 2017-08-17: qty 4

## 2017-08-17 MED ORDER — SODIUM CHLORIDE 0.9 % IV SOLN
Freq: Once | INTRAVENOUS | Status: AC
  Administered 2017-08-17: 07:00:00 via INTRAVENOUS

## 2017-08-17 NOTE — Progress Notes (Signed)
Exchanged endotracheal tube, See Emar for medications administered. Procedure preformed, patient stable, vital signs stable.

## 2017-08-17 NOTE — Progress Notes (Signed)
Chaplain was making his rounds and visited with pt in [place]. Chaplain provided [employer] of prayer and a pastoral presence.    08/17/17 1015  Clinical Encounter Type  Visited With Patient  Visit Type Initial;Spiritual support  Referral From Nurse  Consult/Referral To Chaplain  Spiritual Encounters  Spiritual Needs Prayer

## 2017-08-17 NOTE — Progress Notes (Signed)
PULMONARY / CRITICAL CARE MEDICINE   Name: Jesus [name] MRN: [id number] DOB: [birth year]    ADMISSION DATE:  08/16/2017   PT PROFILE: 55 M with hx of cirrhosis and chronic iron def anemia adm via ED 09/16 when he presented hematemesis then suffered PEA arrest in ED and 40 mins ACLS.   MAJOR EVENTS/TEST RESULTS: 09/16 adm with above history. Intubated during ACLS. After ROSC, required vasopressors.  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 Treated with argon plasma coagulation. Bleeding grade II esophageal varices. Incompletely eradicated. Banded. 09/17 Cardiac markers elevated (trop I 16.81) 09/17 Echocardiogram:   INDWELLING DEVICES:: ETT 09/16 >>  L femoral CVL 09/16 >>  R femoral A-line 09/16 >>   MICRO DATA: MRSA PCR 09/16 >> NEG Urine 09/16 >> multiple species Blood 09/16 >>   ANTIMICROBIALS:  Vanc  09/16 >> 09/17 Metronidazole 09/16 >> 09/17 Meropenem 09/16 >>   PCT  09/16: 0.78, 3.93,     SUBJECTIVE:  RASS -5, staring blankly, not F/C  VITAL SIGNS: BP 132/64   Pulse 95   Temp 98.2 F (36.8 C) (Core (Comment))   Resp 20   Wt 123.4 kg (272 lb)   SpO2 98%   BMI 41.36 kg/m   HEMODYNAMICS:    VENTILATOR SETTINGS: Vent Mode: PRVC FiO2 (%):  [35 %] 35 % Set Rate:  [20 bmp-24 bmp] 20 bmp Vt Set:  [50 mL-500 mL] 500 mL PEEP:  [5 cmH20] 5 cmH20 Plateau Pressure:  [14 cmH20-29 cmH20] 26 cmH20  INTAKE / OUTPUT: I/O last 3 completed shifts: In: 25849.8 [I.V.:13320.8; OZHYQ:6578; Other:45; NG/GT:210; IV Piggyback:10350] Out: 3668 [Urine:1393; Emesis/NG output:150; Stool:2125]  PHYSICAL EXAMINATION: General: intubated, sedated Neuro: pupils constricted, Doll's eyes intact, minimal spont movement, spont ventilatory effort present HEENT: NCAT, + scericterus Cardiovascular: reg, no M Lungs: assistant anteriorly Abdomen: Mildly distended, soft, bowel sounds present Extremities:  Cool, diminished pulses, no edema Skin: Pale  LABS:  BMET  Recent Labs Lab 08/16/17 1941 08/17/17 0011 08/17/17 0555  NA 146* 144 143  K 3.6 3.5 3.9  CL 111 110 112*  CO2 BUN 96* 97* 96*  CREATININE 2.37* 2.33* 2.30*  GLUCOSE 130* 178* 228*    Electrolytes  Recent Labs Lab 08/16/17 0456  08/16/17 1941 08/17/17 0011 08/17/17 0555  CALCIUM 8.7*  < > 8.0* 7.8* 7.6*  MG 2.0  --   --  1.4* 1.3*  PHOS 7.8*  --   --   --  2.6  < > = values in this interval not displayed.  CBC  Recent Labs Lab 08/16/17 1941 08/17/17 0011 08/17/17 0555  WBC 24.1* 18.0* 10.0  HGB 7.8* 7.4* 7.3*  HCT 22.9* 21.9* 21.3*  PLT 88* 66* 41*    Coag's  Recent Labs Lab 08/16/17 0950 08/16/17 1941  APTT 43*  --   INR 2.84 2.23    Sepsis Markers  Recent Labs Lab 08/16/17 0051 08/16/17 0422 08/17/17 0555  LATICACIDVEN 21.9* 18.6* 4.1*  PROCALCITON  --  0.78 3.93    ABG  Recent Labs Lab 08/16/17 0630 08/16/17 1417 08/17/17 0500  PHART 7.10* 7.41 7.37  PCO2ART 41 40 37  PO2ART 105 94 75*    Liver Enzymes  Recent Labs Lab 08/16/17 0049 08/16/17 0456  AST 122* 618*  ALT 77* 392*  ALKPHOS 67 109  BILITOT 1.2 1.3*  ALBUMIN 2.3* 2.2*    Cardiac Enzymes  Recent  Labs Lab 08/16/17 0456 08/16/17 0950 08/16/17 1745  TROPONINI 0.45* 6.53* 16.81*    Glucose  Recent Labs Lab 08/16/17 2209 08/16/17 2313 08/17/17 0015 08/17/17 0107 08/17/17 0440 08/17/17 0824  GLUCAP 128* 157* 163* 172* 208* 189*    CXR: 09/16 cardiomegaly, no acute infiltrates or edema   ASSESSMENT / PLAN:  PULMONARY A: VDRF after cardiac arrest P:   Cont vent support - settings reviewed and/or adjusted Cont vent bundle Daily SBT if/when meets criteria   CARDIOVASCULAR A:  Hemorrhagic/hypovolemic shock Possible component of cardiogenic shock Elevated troponin I - likely secondary event P:  MAP goal > 65 mmHg Wean off phenylephrine first, the NE Will DC VP  once NE dose < 10 mcg/min Consider cardiology consultation if/when recovers from critical phase of this illness  RENAL A:   AKI, nonoliguric P:   Monitor BMET intermittently Monitor I/Os Correct electrolytes as indicated   GASTROINTESTINAL A:   Cirrhosis UGIB - gastric angioectasias and esophageal varices P:   GI consultation appreciated Cirrhosis w/u initiated Continue Pantoprazole infusion Continue octreotide infusion  HEMATOLOGIC A:   Acute blood loss anemia Coagulopathy due to liver failure Thrombocytopenia P:  Transfuse RBCs to keep Hgb >8.0 g/dL Transfuse platelets to keep greater than 50K Transfuse FFP to maintain PT < 20 sec (INR < 2) Vitamin K SQ 3 doses ordered DIC panel ordered for 09/18  INFECTIOUS A:   Elevated PCT, no obvious infection High risk SBP P:   Monitor temp, WBC count Micro and abx as above  ENDOCRINE A:   DM 2 Severe hyperglycemia - improved P:   Continue SSI, resistant scale  NEUROLOGIC A:   Anoxic encephalopathy ICU/ventilator associated discomfort P:   RASS goal: -1, -2 PAD protocol - fentanyl infusion   FAMILY: There is no family at the bedside during my rounds this morning. I will try to meet with them later today.  CCM time: 60 mins The above time includes time spent in consultation with patient and/or family members and reviewing care plan on multidisciplinary rounds and discussing with Dr. Pilar Jarvis, MD PCCM service Mobile [phone] Pager [phone]    08/17/2017, 12:11 PM

## 2017-08-17 NOTE — Consult Note (Signed)
Consultation Note Date: 08/17/2017   Patient Name: Jesus [name]  DOB: 19-Feb-1951  MRN: [id number]  Age / Sex: 66 y.o., male  PCP: [facility], [place] Not In Referring Physician: Loletha Grayer, MD  Reason for Consultation: Establishing goals of care  HPI/Patient Profile: 66 y.o. male  with past medical history of NASH cirrhosis, diabetes mellitus, and recurrent anemia (receiving regular transfusions) who was admitted on 08/16/2017 with altered mental status and hyperglycemia.  At home he was found unresponsive.  He had been confused and vomiting blood.  In the ED he suffered cardiac arrest x2 and was resuscitated. Admission work up showed severe anemia. His lactic acid was elevated (21), Ph 7.1 and he required emergent intubation.   Clinical Assessment and Goals of Care  I have reviewed medical records including EPIC notes, labs and imaging, received report from the bedside RN and the CCM physician, assessed the patient and then met at the bedside along with his wife and two daughters  to discuss diagnosis prognosis, GOC, EOL wishes, disposition and options.  I introduced Palliative Medicine as specialized medical care for people living with serious illness. It focuses on providing relief from the symptoms and stress of a serious illness. The goal is to improve quality of life for both the patient and the family.  We discussed a brief life review of the patient. Prior to 1999 he was very active.  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  In 1999 he retired due to injury.  He underwent many orthopedic surgeries (shoulder, arm, back, etc).  He seemed to have one health problem after another.  He became very sedentary, depressed and was unmotivated to improve his health.  He lives at home with his wife.  He lives bed to chair and is able to get from room to room on his rolling walker.  He  has two grown daughters, Lenna Sciara and Alinda Sierras, who live in the area.  We discussed his health at length.  The family was frustrated and Mr. Szczesniak uses the [facility] as his PCP.  He was hospitalized for two weeks a few months ago - the family complained that he was not ready for discharge when he was sent home and that they received no information from the [place] about what was wrong with him.  They explained that he has been receiving regular infusions of iron and blood.  Lately after his iron infusions he has become very sick.  The daughters are worried he is having a bad reaction to the infusions. The family described increased confusion and falling from time to time particularly over the last two years.  We discussed NASH cirrhosis and its associated encephalopathy, coagulopathy, and possibly right sided heart failure.  The family seemed to understand better what had been happening to Mr. Bala.  We discussed the 2 cardiac arrests.  The family was concerned their father was brain dead.  I explained that he was not - but that we needed to take  things 1 step at a time.  (1) attempt to stop the bleeding, (2) attempt to correct the encephalopathy, (3) then determine if he has an ischemic brain injury or stroke from the cardiac arrests.    We discussed code status.  The family felt that if he were to pass now they would not want him resuscitated, rather they would want him to pass on peacefully. However if he were declining they would want full treatment including intubation (as he is intubated now).   The family had received enough information for 1 day and we agreed to talk again tomorrow.   Questions and concerns were addressed.  Hard Choices booklet left for review. The family was encouraged to call with questions or concerns.    Primary Decision Maker:  NEXT OF KIN Wife Darlene, but she relies on her daughters as well.    SUMMARY OF RECOMMENDATIONS    Continue to meet with the family to help guide  decision making and goals of care.  Code Status/Advance Care Planning:  DNR - but with full scope treatment including information.   Symptom Management:   Per primary team.  Additional Recommendations (Limitations, Scope, Preferences):  Full Scope Treatment   Psycho-social/Spiritual:   Desire for further Chaplaincy support:  Yes the family has been very appreciative of Chaplain support  Prognosis: poor prognosis given cardiac arrest x 2, GI bleed, Hypotension, coagulopathy, encephalopathy possible ischemic brain injury.    Discharge Planning: To Be Determined      Primary Diagnoses: Present on Admission: . Cardiac arrest ([place])   I have reviewed the medical record, interviewed the patient and family, and examined the patient. The following aspects are pertinent.  Past Medical History:  Diagnosis Date  . Cirrhosis of liver ([place])   . Diabetes [facility])    Social History   Social History  . Marital status: Married    Spouse name: N/A  . Number of children: N/A  . Years of education: N/A   Social History Main Topics  . Smoking status: Never Smoker  . Smokeless tobacco: Never Used  . Alcohol use No  . Drug use: No  . Sexual activity: Not Asked   Other Topics Concern  . None   Social History Narrative  . None   Family History  Problem Relation Age of Onset  . Diabetes Mellitus II Father   . CAD Neg Hx   . Hypertension Neg Hx    Scheduled Meds: . chlorhexidine gluconate (MEDLINE KIT)  15 mL Mouth Rinse BID  . insulin aspart  0-20 Units Subcutaneous Q4H  . mouth rinse  15 mL Mouth Rinse 10 times per day  . phytonadione  10 mg Subcutaneous Daily  . sodium chloride flush  10-40 mL Intracatheter Q12H   Continuous Infusions: . sodium chloride    . fentaNYL infusion INTRAVENOUS 50 mcg/hr (08/17/17 1946)  . meropenem (MERREM) IV Stopped (08/17/17 1014)  . norepinephrine (LEVOPHED) 80m / 25108minfusion 4.053 mcg/min (08/17/17 1946)  . octreotide   (SANDOSTATIN)    IV infusion 50 mcg/hr (08/17/17 1946)  . pantoprozole (PROTONIX) infusion 8 mg/hr (08/17/17 1946)  . phenylephrine (NEO-SYNEPHRINE) Adult infusion Stopped (08/17/17 1422)  . vasopressin (PITRESSIN) infusion - *FOR SHOCK* Stopped (08/17/17 1946)   PRN Meds:.acetaminophen **OR** acetaminophen, bisacodyl, midazolam, [DISCONTINUED] ondansetron **OR** ondansetron (ZOFRAN) IV, sennosides, sodium chloride flush Allergies  Allergen Reactions  . Penicillins Hives and Other (See Comments)    Has patient had a PCN reaction causing immediate rash, facial/tongue/throat swelling, SOB  or lightheadedness with hypotension: Yes Has patient had a PCN reaction causing severe rash involving mucus membranes or skin necrosis: No Has patient had a PCN reaction that required hospitalization: No Has patient had a PCN reaction occurring within the last 10 years: No If all of the above answers are "NO", then may proceed with Cephalosporin use.    Review of Systems intubated  Physical Exam  Intubated, sedated, receiving blood.  Will open his eyes to his wife's voice.  Vital Signs: BP (!) 93/58   Pulse (!) 101   Temp 98.4 F (36.9 C) (Core (Comment)) Comment (Src): temp foley  Resp 15   Wt 123.4 kg (272 lb)   SpO2 100%   BMI 41.36 kg/m  Pain Assessment: CPOT       SpO2: SpO2: 100 % O2 Device:SpO2: 100 % O2 Flow Rate: .O2 Flow Rate (L/min): 2 L/min  IO: Intake/output summary:  Intake/Output Summary (Last 24 hours) at 08/17/17 2013 Last data filed at 08/17/17 1946  Gross per 24 hour  Intake          7545.99 ml  Output             4448 ml  Net          3097.99 ml    LBM: Last BM Date: 08/17/17 Baseline Weight: Weight: 123.4 kg (272 lb) Most recent weight: Weight: 123.4 kg (272 lb)     Palliative Assessment/Data:     Time In: 12:00 Time Out: 1:10 pm Time Total: 70 min. Greater than 50%  of this time was spent counseling and coordinating care related to the above assessment  and plan.  Signed by: Florentina Jenny, PA-C Palliative Medicine Pager: [phone]  Please contact Palliative Medicine Team phone at [phone] for questions and concerns.  For individual provider: See Shea [name]

## 2017-08-17 NOTE — Care Management (Signed)
[place] forms left with patient's wife and daughter for review. I have faxed notification of patient presentation to [facility]- Gaylyn Rong.

## 2017-08-17 NOTE — Op Note (Addendum)
[facility] Gastroenterology Patient Name: Jesus [name] Procedure Date: 08/17/2017 11:18 AM MRN: [id number] Account #: [id number] Date of Birth: 1951/07/25 Admit Type: Inpatient Age: 66 Room: [facility] ENDO ROOM 4 Gender: Male Note Status: Finalized Procedure:            Upper GI endoscopy Indications:          Melena Providers:            [name] MD, MD Medicines:            Monitored Anesthesia Care, Fentanyl 100 micrograms IV,                        Midazolam 2 mg IV Complications:        No immediate complications. Procedure:            Pre-Anesthesia Assessment:                       - Prior to the procedure, a History and Physical was                        performed, and patient medications, allergies and                        sensitivities were reviewed. The patient's tolerance of                        previous anesthesia was reviewed.                       - The risks and benefits of the procedure and the                        sedation options and risks were discussed with the                        patient. All questions were answered and informed                        consent was obtained.                       - ASA Grade Assessment: IV - A patient with severe                        systemic disease that is a constant threat to life.                       After obtaining informed consent, the endoscope was                        passed under direct vision. Throughout the procedure,                        the patient's blood pressure, pulse, and oxygen                        saturations were monitored continuously. The Endoscope                        was introduced through the mouth, and advanced  to the                        third part of duodenum. The upper GI endoscopy was                        unusually difficult due to excessive bleeding. The                        patient tolerated the procedure well. Findings:      The examined duodenum was  normal.      Two 6 mm bleeding angioectasias were found in the gastric fundus.       Coagulation for hemostasis using argon plasma at 1 liter/minute and 20       watts was successful.      Large qty of coffee ground material with clots was found in the gastric       fundus. Despite irrigation and removal with a net , complete       visualization was not obtained and hence gastric varices could not be       ruled out-      Three columns of oozing grade II varices were found in the middle third       of the esophagus and in the lower third of the esophagus, 30 cm from the       incisors. They were 7 mm in largest diameter. Stigmata of recent       bleeding were evident and red wale signs were present. Four bands were       successfully placed with incomplete eradication of varices. There was no       bleeding at the end of the procedure. Impression:           - Normal examined duodenum.                       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 Treated                        with argon plasma coagulation (APC).                       - In the gastric fundus.                       - Bleeding grade II esophageal varices. Incompletely                        eradicated. Banded.                       - No specimens collected. Moderate Sedation:      Moderate (conscious) sedation was administered by the endoscopy nurse       and supervised by the endoscopist. The following parameters were       monitored: oxygen saturation, heart rate, blood pressure, and response       to care. Total physician intraservice time was 3 minutes. Recommendation:       - Observe patient in [Observe area] [Time] [Reason].                       - Continue Octreotide and PPI, if has further bleeding  will need TIPS, no role for further endoscopy. Procedure Code(s):    --- Professional ---                       [id number], Esophagogastroduodenoscopy, flexible, transoral;                         with band ligation of esophageal/gastric varices                       43255, 59, Esophagogastroduodenoscopy, flexible,                        transoral; with control of bleeding, any method Diagnosis Code(s):    --- Professional ---                       K31.811, Angiodysplasia of stomach and duodenum with                        bleeding                       K92.2, Gastrointestinal hemorrhage, unspecified                       I85.01, Esophageal varices with bleeding                       K92.1, Melena (includes Hematochezia) CPT copyright 2016 American Medical Association. All rights reserved. The codes documented in this report are preliminary and upon coder review may  be revised to meet current compliance requirements. [name], MD [name] MD, MD 08/17/2017 12:19:09 PM This report has been signed electronically. Number of Addenda: 0 Note Initiated On: 08/17/2017 11:18 AM      [facility]

## 2017-08-17 NOTE — Progress Notes (Signed)
Leak heard around cuff upon entering room. Inflated and and deflated cuff and attempted to fix leak. Tube at 22 at lipline. Advanced tube to 23 at lipline where it was originally located. Leak still heard around tube. sats stable. Exhaled tidal volumes deceased. Talked to [facility] NP and decided to change out tube in case of blown cuff. Wilkie Aye RRT attempted to change with tube changer. Got tube to 23 at lipline but was difficult to push in. Inflated cuff, leak still noted. CO2 change positive. Removed tube and Dana intubated with glidescope. No other complications. CO2 change positive. ETT now at 25 at lipline. No leak noted.  Placed back on vent.

## 2017-08-17 NOTE — Progress Notes (Addendum)
Jesus Bellows MD, MRCP(U.K) [address]  [place]  [place], [place] [address]  Main: Hidalgo is being followed for Anemia   Subjective: Over night large tarry black bowel movement , coffee ground aspirate in NG tube   Objective: Vital signs in last 24 hours: Vitals:   08/17/17 0915 08/17/17 0930 08/17/17 0945 08/17/17 1000  BP:    (!) 143/71  Pulse: 90 91 92 94  Resp: (!) 24 (!) 24 (!) 24 (!) 24  Temp:      TempSrc:      SpO2: 98% 98% 98% 98%  Weight:       Weight change:   Intake/Output Summary (Last 24 hours) at 08/17/17 1025 Last data filed at 08/17/17 0945  Gross per 24 hour  Intake         11441.35 ml  Output             3668 ml  Net          7773.35 ml     Exam: Heart:: Regular rate and rhythm, S1S2 present or without murmur or extra heart sounds Lungs: normal, clear to auscultation and clear to auscultation and percussion Abdomen: distended, soft , no tenderness, BS+    Micro Results: Recent Results (from the past 240 hour(s))  Blood Culture (routine x 2)     Status: None (Preliminary result)   Collection Time: 08/16/17 12:50 AM  Result Value Ref Range Status   Specimen Description BLOOD LEFT ARM  Final   Special Requests   Final    BOTTLES DRAWN AEROBIC AND ANAEROBIC Blood Culture adequate volume   Culture NO GROWTH 1 DAY  Final   Report Status PENDING  Incomplete  Blood Culture (routine x 2)     Status: None (Preliminary result)   Collection Time: 08/16/17 12:51 AM  Result Value Ref Range Status   Specimen Description BLOOD LEFT ANTECUBITAL  Final   Special Requests   Final    BOTTLES DRAWN AEROBIC AND ANAEROBIC Blood Culture adequate volume   Culture NO GROWTH 1 DAY  Final   Report Status PENDING  Incomplete  Urine culture     Status: Abnormal   Collection Time: 08/16/17 12:51 AM  Result Value Ref Range Status   Specimen Description URINE, RANDOM  Final   Special Requests NONE  Final   Culture MULTIPLE SPECIES  PRESENT, SUGGEST RECOLLECTION (A)  Final   Report Status 08/17/2017 FINAL  Final  MRSA PCR Screening     Status: None   Collection Time: 08/16/17  4:56 AM  Result Value Ref Range Status   MRSA by PCR NEGATIVE NEGATIVE Final    Comment:        The GeneXpert MRSA Assay (FDA approved for NASAL specimens only), is one component of a comprehensive MRSA colonization surveillance program. It is not intended to diagnose MRSA infection nor to guide or monitor treatment for MRSA infections.    Studies/Results: Dg Abd 1 View  Result Date: 08/16/2017 CLINICAL DATA:  Orogastric tube placement. EXAM: ABDOMEN - 1 VIEW COMPARISON:  Chest radiograph of earlier today. FINDINGS: Single supine view of the upper abdomen and lower chest. The orogastric tube is difficult to visualize distally due to extensive overlying support apparatus. Cardiomegaly. No gross free intraperitoneal air. IMPRESSION: Suboptimal visualization of the orogastric tube secondary to extensive overlying support apparatus. Consider repeat radiograph with either removal of support apparatus, or positioning the x-ray being more inferiorly with greater  penetration. Electronically Signed   By: Abigail Miyamoto M.D.   On: 08/16/2017 12:23   Dg Chest Port 1 View  Result Date: 08/16/2017 CLINICAL DATA:  Post intubation.  NG tube placement. EXAM: PORTABLE CHEST 1 VIEW COMPARISON:  05/20/2017 FINDINGS: Endotracheal tube 4.4 cm in the carina. Enteric tube in place, tip below the diaphragm beyond the field of view, likely in the stomach. Lung volumes are low. The heart is enlarged. No pulmonary edema, confluent airspace disease, large pleural effusion or pneumothorax. Gallstones incidentally noted in the upper abdomen. IMPRESSION: 1. Endotracheal tube 4.4 cm from the carina. Enteric tube with tip below the diaphragm likely in the stomach, not included in the field of view. 2. Low lung volumes with cardiomegaly. Electronically Signed   By: Jeb Levering  M.D.   On: 08/16/2017 02:07   Medications: I have reviewed the patient's current medications. Scheduled Meds: . chlorhexidine gluconate (MEDLINE KIT)  15 mL Mouth Rinse BID  . insulin aspart  0-20 Units Subcutaneous Q4H  . mouth rinse  15 mL Mouth Rinse 10 times per day  . phytonadione  10 mg Subcutaneous Daily  . sodium chloride flush  10-40 mL Intracatheter Q12H   Continuous Infusions: . sodium chloride    . fentaNYL infusion INTRAVENOUS 50 mcg/hr (08/17/17 0800)  . meropenem (MERREM) IV 1 g (08/17/17 0944)  . norepinephrine (LEVOPHED) 66m / 2513minfusion 20 mcg/min (08/17/17 0848)  . octreotide  (SANDOSTATIN)    IV infusion 50 mcg/hr (08/17/17 0800)  . pantoprozole (PROTONIX) infusion 8 mg/hr (08/17/17 0800)  . phenylephrine (NEO-SYNEPHRINE) Adult infusion 100 mcg/min (08/17/17 0850)  . vasopressin (PITRESSIN) infusion - *FOR SHOCK* 0.03 Units/min (08/17/17 0800)   PRN Meds:.acetaminophen **OR** acetaminophen, bisacodyl, midazolam, midazolam, [DISCONTINUED] ondansetron **OR** ondansetron (ZOFRAN) IV, sennosides, sodium chloride flush   Assessment: Active Problems:   Cardiac arrest (HCHiramBrDwan Bolt89.o. male admitted to the ICU with PEA arrest x 2 , acute on chronic [place] [facility] [place] [facility] [place]. H/o liver cirrhosis . On this admission noted to have a possible shocked liver, on pressors , received blood. GI consulted for possible coffee ground emesis after intubation .Presently has multi organ failure . INR 2.84     1. Anemia- macrocytic on admission- Has had large tarry black stools suggestive of upper GI blood loss - Needs emergent EGD to r/o a variceal bleed.  Check iron studies , b12,folate , TSH,Continue PPI octreotide for now. Discussed with Dr SiLeonidas Rombergnd agrees with plan .Will perform urgent EGD to stop any variceal bleed in the ICU.    2. Acute liver failure- likely from shocked liver but will need to r/o acute hepatitis.  Suggest check acute Hepatitis panel , EBV,CMV,HSV,VZV, Vitamin K to reverse coagulopathy, usg doppler to r/o thrombosis of hepatic vasculature. Check Tylenol levels on admission labs.   Continue supportive care- overall poor prognosis     LOS: 1 day   KiJonathon [name]/17/2018, 10:25 AM

## 2017-08-17 NOTE — Progress Notes (Signed)
Pharmacy Antibiotic Note  Jesus [name] is a 66 y.o. male admitted on 08/16/2017 with sepsis.  Pharmacy has been consulted for meropenem dosing. Vancomycin and Flagyl d/c 9/16.   Plan: Will continue meropenem 1 g iv q 12 hours.   Weight: 272 lb (123.4 kg)  Temp (24hrs), Avg:99.9 F (37.7 C), Min:97.9 F (36.6 C), Max:102.2 F (39 C)   Recent Labs Lab 08/16/17 0049 08/16/17 0051 08/16/17 0422  08/16/17 0729 08/16/17 1419 08/16/17 1745 08/16/17 1941 08/17/17 0011 08/17/17 0555  WBC 19.0*  --   --   --  25.3*  --   --  24.1* 18.0* 10.0  CREATININE 2.36*  --   --   < >  --  2.36* 2.36* 2.37* 2.33* 2.30*  LATICACIDVEN  --  21.9* 18.6*  --   --   --   --   --   --  4.1*  < > = values in this interval not displayed.  Estimated Creatinine Clearance: 40.4 mL/min (A) (by C-G formula based on SCr of 2.3 mg/dL (H)).    Allergies  Allergen Reactions  . Penicillins Hives and Other (See Comments)    Has patient had a PCN reaction causing immediate rash, facial/tongue/throat swelling, SOB or lightheadedness with hypotension: Yes Has patient had a PCN reaction causing severe rash involving mucus membranes or skin necrosis: No Has patient had a PCN reaction that required hospitalization: No Has patient had a PCN reaction occurring within the last 10 years: No If all of the above answers are "NO", then may proceed with Cephalosporin use.    ABX: 9/16 aztreonam x 1 9/16 Levaquin x 1 9/16 meropenem >>  9/16 vancomycin x 1  Micro:  9/16 MRSA PCR: negative 9/16 BCx: NGTD UCx: multiple species  Thank you for allowing pharmacy to be a part of this patient's care.  Luisa Hart, PharmD Clinical Pharmacist  08/17/2017

## 2017-08-17 NOTE — Progress Notes (Signed)
CRITICAL VALUE ALERT  Critical Value:  4.1 lactic  Date & Time Notied:  08/17/17 @ 0645  Provider Notified: Ms. Luci Bank, NP  Orders Received/Actions taken: value trending down, will continue to monitor, pt. Continues to be fluid resuscitated.

## 2017-08-17 NOTE — Progress Notes (Signed)
Initial Nutrition Assessment  DOCUMENTATION CODES:   Morbid obesity  INTERVENTION:  No plan for initiation of tube feeds at this time.  If patient expected to remain intubated greater than 24-48 hours, consider initiation of adult TF protocol once patient is adequately volume resuscitated, hemodynamically stable, and able to utilize GI tract.  If tube feeds are initiated, recommend confirmation of OGT placement prior to initiation. Was not able to be visualized on abdominal x-ray yesterday. Did not see any mention of where OGT terminated on note from EGD today.  If tube feeds are initiated, recommend goal regimen of Vital High Protein at 55 ml/hr + Pro-Stat 30 ml BID via OGT. Goal regimen provides 1720 kcal, 176 grams of protein, 1109 ml H2O daily.  NUTRITION DIAGNOSIS:   Inadequate oral intake related to inability to eat (intubated/sedated) as evidenced by NPO status.  GOAL:   Provide needs based on ASPEN/SCCM guidelines  MONITOR:   Vent status, Labs, Weight trends, I & O's, Skin  REASON FOR ASSESSMENT:   Ventilator    ASSESSMENT:   66 year old male with PMHx of DM type 2, cirrhosis who presented with AMS after being found unresponsive, and also had hematemesis. Suffered PEA arrest in ED and underwent 40 minutes ACLS and was emergently intubated 9/16.   -Hypothermia protocol was not initiated. -Pt received 5 units RBCs, 4 units FFP, 1 unit platelets -Pt s/p EGD today. 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 Gastric varices could not be ruled out in setting of poor visualization of stomach due to large quantity of coffee-ground material in fundus. Bleeding grade II esophageal varices found and were banded (incompletely eradicated). Duodenum was normal.  No family members present at time of assessment. Limited weight history in chart. Patient was 251.1 lbs (113.9 kg) on 06/04/2017, so currently +9.5 kg from that  weight.  Access: OGT placed 08/16/2017; currently to LIS; per abdominal x-ray on 9/16 there was suboptimal visualization of OGT - recommended repeat radiograph  Patient is currently intubated on ventilator support MV: 10.6 L/min Temp (24hrs), Avg:100.2 F (37.9 C), Min:97.9 F (36.6 C), Max:102.2 F (39 C)  Propofol: N/A  Medications reviewed and include: Novolog 0-20 units Q4hrs, vitamin K 10 mg daily, fentanyl gtt, meropenem, Levophed gtt, octreotide, pantoprazole, phenylephrine gtt, vasopressin gtt.  Labs reviewed: CBG 67-388 past 24 hrs, Chloride 112, BUN 96, Creatinine 2.3, Magnesium 1.3. Phosphorus and Potassium WNL.  Nutrition-Focused physical exam completed. Findings are no fat depletion, no muscle depletion, and mild-moderate edema (mild edema to bilateral upper extremities, moderate edema to bilateral lower extremities).   Discussed with RN.  Diet Order:     Skin:  Wound (see comment) (MSAD to abdomen)  Last BM:  08/17/2017 - small black type 7  Height:   Ht Readings from Last 1 Encounters:  05/28/17  (1.727 m)    Weight:   Wt Readings from Last 1 Encounters:  08/16/17 272 lb (123.4 kg)    Ideal Body Weight:  70 kg (calculated with ht of  from previous admission)  BMI:  Body mass index is 41.36 kg/m.  Estimated Nutritional Needs:   Kcal:  1357-1728 (11-14 kcal/kg)  Protein:  >/= 175 grams (>/= 2.5 grams/kg IBW)  Fluid:  2.1 L/day (30 ml/kg IBW)  EDUCATION NEEDS:   No education needs identified at this time  Helane Rima, MS, RD, LDN Pager: [phone] After Hours Pager: [phone]

## 2017-08-17 NOTE — Progress Notes (Signed)
Patient ID: Jesus [name], male   DOB: [birth year], 66 y.o.   MRN: [id number]  [employer] [facility] PROGRESS NOTE  JENO CALLEROS GEX:[id number] DOB: 1951/11/20 DOA: 08/16/2017 PCP: System, Pcp Not In  HPI/Subjective: Patient intubated and sedated.  Objective: Vitals:   08/17/17 1315 08/17/17 1330  BP:    Pulse: (!) 109 (!) 109  Resp: 11 12  Temp:    SpO2: 97% 96%    Filed Weights   08/16/17 0045  Weight: 123.4 kg (272 lb)    ROS: Review of Systems  Unable to perform ROS: Intubated   Exam: Physical Exam  Constitutional: He is intubated.  HENT:  Dark material from OG tube  Eyes: Pupils are equal, round, and reactive to light. Lids are normal.  icteric  Neck: No JVD present. Carotid bruit is not present. No edema present. No thyroid mass and no thyromegaly present.  Cardiovascular: S1 normal, S2 normal and normal heart sounds.  Tachycardia present.  Exam reveals no gallop.   No murmur heard. Pulses:      Dorsalis pedis pulses are 1+ on the right side, and 1+ on the left side.  Respiratory: He is intubated. No respiratory distress. He has decreased breath sounds in the right lower field and the left lower field. He has no wheezes. He has rhonchi in the right lower field and the left lower field. He has no rales.  Audible wheeze from ET tube  GI: Soft. Bowel sounds are normal. There is no tenderness.  Musculoskeletal:       Right ankle: He exhibits swelling.       Left ankle: He exhibits swelling.  Lymphadenopathy:    He has no cervical adenopathy.  Neurological:  Intubated and sedated  Skin: Skin is warm. Nails show no clubbing.  Psychiatric:  Patient intubated and sedated.      Data Reviewed: Basic Metabolic Panel:  Recent Labs Lab 08/16/17 0456 08/16/17 1419 08/16/17 1745 08/16/17 1941 08/17/17 0011 08/17/17 0555  NA 143 147* 145 146* 144 143  K 4.3 3.9 3.6 3.6 3.5 3.9  CL 105 112* 112* 111 110 112*  CO2 11* 25 25 27 25 23   GLUCOSE 649* 348* 159* 130*  178* 228*  BUN 89* 96* 96* 96* 97* 96*  CREATININE 2.29* 2.36* 2.36* 2.37* 2.33* 2.30*  CALCIUM 8.7* 8.1* 7.8* 8.0* 7.8* 7.6*  MG 2.0  --   --   --  1.4* 1.3*  PHOS 7.8*  --   --   --   --  2.6   Liver Function Tests:  Recent Labs Lab 08/16/17 0049 08/16/17 0456  AST 122* 618*  ALT 77* 392*  ALKPHOS 67 109  BILITOT 1.2 1.3*  PROT 5.0* 4.9*  ALBUMIN 2.3* 2.2*    Recent Labs Lab 08/16/17 0456  LIPASE 33  AMYLASE 145*    Recent Labs Lab 08/16/17 0051 08/16/17 1058  AMMONIA 177* 325*   CBC:  Recent Labs Lab 08/16/17 0049 08/16/17 0729 08/16/17 1941 08/17/17 0011 08/17/17 0555  WBC 19.0* 25.3* 24.1* 18.0* 10.0  NEUTROABS 15.9*  --   --   --   --   HGB 5.8* 9.1* 7.8* 7.4* 7.3*  HCT 20.5* 29.7* 22.9* 21.9* 21.3*  MCV 99.6 93.2 86.5 87.1 85.7  PLT 174 151 88* 66* 41*   Cardiac Enzymes:  Recent Labs Lab 08/16/17 0049 08/16/17 0456 08/16/17 0950 08/16/17 1745  CKTOTAL 171  --   --   --   TROPONINI 0.06* 0.45* 6.53*  16.81*    CBG:  Recent Labs Lab 08/16/17 2313 08/17/17 0015 08/17/17 0107 08/17/17 0440 08/17/17 0824  GLUCAP 157* 163* 172* 208* 189*    Recent Results (from the past 240 hour(s))  Blood Culture (routine x 2)     Status: None (Preliminary result)   Collection Time: 08/16/17 12:50 AM  Result Value Ref Range Status   Specimen Description BLOOD LEFT ARM  Final   Special Requests   Final    BOTTLES DRAWN AEROBIC AND ANAEROBIC Blood Culture adequate volume   Culture NO GROWTH 1 DAY  Final   Report Status PENDING  Incomplete  Blood Culture (routine x 2)     Status: None (Preliminary result)   Collection Time: 08/16/17 12:51 AM  Result Value Ref Range Status   Specimen Description BLOOD LEFT ANTECUBITAL  Final   Special Requests   Final    BOTTLES DRAWN AEROBIC AND ANAEROBIC Blood Culture adequate volume   Culture NO GROWTH 1 DAY  Final   Report Status PENDING  Incomplete  Urine culture     Status: Abnormal   Collection Time:  08/16/17 12:51 AM  Result Value Ref Range Status   Specimen Description URINE, RANDOM  Final   Special Requests NONE  Final   Culture MULTIPLE SPECIES PRESENT, SUGGEST RECOLLECTION (A)  Final   Report Status 08/17/2017 FINAL  Final  MRSA PCR Screening     Status: None   Collection Time: 08/16/17  4:56 AM  Result Value Ref Range Status   MRSA by PCR NEGATIVE NEGATIVE Final    Comment:        The GeneXpert MRSA Assay (FDA approved for NASAL specimens only), is one component of a comprehensive MRSA colonization surveillance program. It is not intended to diagnose MRSA infection nor to guide or monitor treatment for MRSA infections.      Studies: Dg Abd 1 View  Result Date: 08/16/2017 CLINICAL DATA:  Orogastric tube placement. EXAM: ABDOMEN - 1 VIEW COMPARISON:  Chest radiograph of earlier today. FINDINGS: Single supine view of the upper abdomen and lower chest. The orogastric tube is difficult to visualize distally due to extensive overlying support apparatus. Cardiomegaly. No gross free intraperitoneal air. IMPRESSION: Suboptimal visualization of the orogastric tube secondary to extensive overlying support apparatus. Consider repeat radiograph with either removal of support apparatus, or positioning the x-ray being more inferiorly with greater penetration. Electronically Signed   By: Abigail Miyamoto M.D.   On: 08/16/2017 12:23   Dg Chest Port 1 View  Result Date: 08/16/2017 CLINICAL DATA:  Post intubation.  NG tube placement. EXAM: PORTABLE CHEST 1 VIEW COMPARISON:  05/20/2017 FINDINGS: Endotracheal tube 4.4 cm in the carina. Enteric tube in place, tip below the diaphragm beyond the field of view, likely in the stomach. Lung volumes are low. The heart is enlarged. No pulmonary edema, confluent airspace disease, large pleural effusion or pneumothorax. Gallstones incidentally noted in the upper abdomen. IMPRESSION: 1. Endotracheal tube 4.4 cm from the carina. Enteric tube with tip below the  diaphragm likely in the stomach, not included in the field of view. 2. Low lung volumes with cardiomegaly. Electronically Signed   By: Jeb Levering M.D.   On: 08/16/2017 02:07    Scheduled Meds: . chlorhexidine gluconate (MEDLINE KIT)  15 mL Mouth Rinse BID  . insulin aspart  0-20 Units Subcutaneous Q4H  . mouth rinse  15 mL Mouth Rinse 10 times per day  . phytonadione  10 mg Subcutaneous Daily  . sodium  chloride flush  10-40 mL Intracatheter Q12H   Continuous Infusions: . sodium chloride    . fentaNYL infusion INTRAVENOUS 50 mcg/hr (08/17/17 1227)  . meropenem (MERREM) IV Stopped (08/17/17 1014)  . norepinephrine (LEVOPHED) 62m / 2585minfusion 18 mcg/min (08/17/17 1422)  . octreotide  (SANDOSTATIN)    IV infusion 50 mcg/hr (08/17/17 1200)  . pantoprozole (PROTONIX) infusion 8 mg/hr (08/17/17 1200)  . phenylephrine (NEO-SYNEPHRINE) Adult infusion Stopped (08/17/17 1422)  . vasopressin (PITRESSIN) infusion - *FOR SHOCK* 0.03 Units/min (08/17/17 1200)    Assessment/Plan:  1. Cardiac arrest. Likely secondary to respiratory arrest and/or GI bleed. 2. Hemorrhagic shock versus septic shock. Patient on 3 pressors to maintain blood pressure. Empiric antibiotics with meropenem. Cultures are negative but broke acetone is elevated.  Patient transfused a unit of blood today and receiving 2 units of FFP. 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 Continue octreotide and Protonix drips. 3. Acute hypoxic respiratory failure. Continue ventilation support as per critical care specialist. Currently on 35% FiO2. 4. Metabolic acidosis and lactic acidosis secondary to respiratory failure, shock. 5. Severe anemia. Continue serial hemoglobins and transfuse as necessary. 6. Acute kidney injury. Continue to monitor closely for worsening kidney function 7. Elevated liver function tests likely secondary to cardiac arrest. 8. 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 Patient is critically ill and high  likelihood of cardiac arrest. Overall prognosis is poor.  Code Status:     Code Status Orders        Start     Ordered   08/16/17 0405  Full code  Continuous     08/16/17 0404    Code Status History    Date Active Date Inactive Code Status Order ID Comments User Context   05/28/2017  5:15 AM 06/04/2017  8:10 PM Full Code [id number] ForemanMD Inpatient     Family Communication: as per critical care specialist Disposition Plan: patient is critically ill  Consultants:  Critical care specialist  gastroenterology  Procedures:  Upper endoscopy  Antibiotics:  meropenem  Time spent: 25 minutes  [facility]berRIFriedensburgh[facility]icians

## 2017-08-17 NOTE — H&P (Signed)
[name] MD [address], [address] [place], [place] [address] Phone: [phone] Fax : [phone]  Primary Care Physician:  System, Pcp Not In Primary Gastroenterologist:  Dr. [name]   Pre-Procedure History & Physical: HPI:  Jesus [name] is a 66 y.o. male is here for an endoscopy.   Past Medical History:  Diagnosis Date  . Cirrhosis of liver (HCC)   . Diabetes [facility])     Past Surgical History:  Procedure Laterality Date  . ROTATOR CUFF REPAIR Right   . TEE WITHOUT CARDIOVERSION N/A 06/04/2017   Procedure: TRANSESOPHAGEAL ECHOCARDIOGRAM (TEE);  Surgeon: Antonieta Iba, MD;  Location: [facility] ORS;  Service: Cardiovascular;  Laterality: N/A;  . TOTAL KNEE ARTHROPLASTY Bilateral     Prior to Admission medications   Medication Sig Start Date End Date Taking? Authorizing Provider  furosemide (LASIX) 40 MG tablet Take 40 mg by mouth daily.    [provider]  glyBURIDE (DIABETA) 5 MG tablet Take 5 mg by mouth daily. 02/08/07   [provider]  insulin aspart (NOVOLOG) 100 UNIT/ML injection Inject 7 Units into the skin 3 (three) times daily with meals. 06/04/17   Enedina Finner, MD  insulin glargine (LANTUS) 100 UNIT/ML injection Inject 0.34 mLs (34 Units total) into the skin daily. 06/04/17   Enedina Finner, MD  lactulose (CHRONULAC) 10 GM/15ML solution Take 45 mLs (30 g total) by mouth daily. 06/04/17   Enedina Finner, MD  levofloxacin (LEVAQUIN) 750 MG tablet Take 1 tablet (750 mg total) by mouth daily. 06/04/17   Enedina Finner, MD  metFORMIN (GLUCOPHAGE) 1000 MG tablet Take 1,000 mg by mouth 2 (two) times daily with a meal.    [provider]  pantoprazole (PROTONIX) 40 MG tablet Take 1 tablet (40 mg total) by mouth daily. 06/04/17   Enedina Finner, MD  traZODone (DESYREL) 50 MG tablet Take 75 mg by mouth at bedtime.     [provider]    Allergies as of 08/16/2017 - Review Complete 08/16/2017  Allergen Reaction Noted  . Penicillins Hives and Other (See  Comments) 05/28/2017    Family History  Problem Relation Age of Onset  . Diabetes Mellitus II Father   . CAD Neg Hx   . Hypertension Neg Hx     Social History   Social History  . Marital status: Married    Spouse name: N/A  . Number of children: N/A  . Years of education: N/A   Occupational History  . Not on file.   Social History Main Topics  . Smoking status: Never Smoker  . Smokeless tobacco: Never Used  . Alcohol use No  . Drug use: No  . Sexual activity: Not on file   Other Topics Concern  . Not on file   Social History Narrative  . No narrative on file    Review of Systems: See HPI, otherwise negative ROS  Physical Exam: BP (!) 143/71   Pulse 94   Temp 98.2 F (36.8 C) (Core (Comment))   Resp (!) 24   Wt 272 lb (123.4 kg)   SpO2 98%   BMI 41.36 kg/m  General:   Alert,  pleasant and cooperative in NAD Head:  Normocephalic and atraumatic. Neck:  Supple; no masses or thyromegaly. Lungs:  Clear throughout to auscultation.    Heart:  Regular rate and rhythm. Abdomen:  Soft, nontender and disetnded  Normal bowel sounds, without guarding, and without rebound.   Neurologic:  Alert and  oriented x0;  grossly normal neurologically.  Impression/Plan: Jesus [name] is here for an endoscopy to be performed for upper GI bleed   Risks, benefits, limitations, and alternatives regarding  endoscopy have been reviewed with the patient.  Questions have been answered.  All parties agreeable.   [name], MD  08/17/2017, 11:10 AM

## 2017-08-17 NOTE — Progress Notes (Signed)
eLink Physician-Brief Progress Note Patient Name: Jesus [name] DOB: 1951-07-10 MRN: [id number]   Date of Service  08/17/2017  HPI/Events of Note  Labs noted with INR 2.2 post transfusion of FFP earlier yesterday. Status post IV vitamin K. Hemoglobin now 7.4. Elevated troponin I. Slight improvement in level of shock/vasopressor support.   eICU Interventions  1. Transfusing 1 unit packed red blood cells for goal hemoglobin greater than 8.0 with elevated troponin I 2. Posttransfusion hemoglobin/hematocrit 3. Transfusing 2 units FFP  4. Stat fibrinogen level 5. INR & PTT post transfusion of FFP 6. Continuing Protonix and octreotide infusions      Intervention Category Major Interventions: Hemorrhage - evaluation and management  Lawanda Cousins 08/17/2017, 12:46 AM

## 2017-08-17 NOTE — Progress Notes (Signed)
Inpatient Diabetes Program Recommendations  AACE/ADA: New Consensus Statement on Inpatient Glycemic Control (2015)  Target Ranges:  Prepandial:   less than 140 mg/dL      Peak postprandial:   less than 180 mg/dL (1-2 hours)      Critically ill patients:  140 - 180 mg/dL   Results for GRIFFON, HERBERG (MRN [id number]) as of 08/17/2017 11:31  Ref. Range 08/16/2017 20:01 08/16/2017 21:11 08/16/2017 21:12 08/16/2017 22:09 08/16/2017 23:13 08/17/2017 00:15 08/17/2017 01:07 08/17/2017 04:40 08/17/2017 08:24  Glucose-Capillary Latest Ref Range: 65 - 99 mg/dL 409 (H) 67 91 811 (H) 914 (H) 163 (H) 172 (H) 208 (H) 189 (H)   Review of Glycemic Control  Diabetes history: DM2 Outpatient Diabetes medications: Lantus 34 units daily, Novolog 7 units TID with meals, Metformin 1000 mg BID, Glipizide 5 mg daily Current orders for Inpatient glycemic control: Novolog 0-20 units Q4H  Inpatient Diabetes Program Recommendations: Insulin - Basal: Please consider ordering at least Lantus 18 units Q24H (based on 123 kg x 0.15 units). Correction (SSI): Please consider discontinuing current Novolog correction and use ICU Glycemic Control order set for glycemic control.  NOTE: Per chart review, patient was admitted with DKA and was initially on IV insulin. Glucose noted to be 172 mg/dl at 7:82 am today and insulin drip was stopped at 1:56 am today. No basal insulin was given at time of transition off IV insulin to SQ insulin. Glucose noted to be 208 mg/dl at 9:56 am today. Recommend ordering Lantus insulin and changing to ICU Glycemic Control order set to improve inpatient glycemic control.  Thanks, Orlando Penner, RN, MSN, CDE Diabetes Coordinator Inpatient Diabetes Program [phone] (Team Pager from 8am to 5pm)

## 2017-08-18 ENCOUNTER — Inpatient Hospital Stay: Payer: Medicare PPO

## 2017-08-18 ENCOUNTER — Encounter: Payer: Self-pay | Admitting: Gastroenterology

## 2017-08-18 ENCOUNTER — Inpatient Hospital Stay (HOSPITAL_COMMUNITY)
Admit: 2017-08-18 | Discharge: 2017-08-18 | Disposition: A | Discharge status: Home / Self Care | Payer: Medicare PPO | Attending: Pulmonary Disease | Admitting: Pulmonary Disease

## 2017-08-18 DIAGNOSIS — I361 Nonrheumatic tricuspid (valve) insufficiency: Secondary | ICD-10-CM

## 2017-08-18 DIAGNOSIS — D62 Acute posthemorrhagic anemia: Secondary | ICD-10-CM

## 2017-08-18 DIAGNOSIS — D689 Coagulation defect, unspecified: Secondary | ICD-10-CM

## 2017-08-18 DIAGNOSIS — R748 Abnormal levels of other serum enzymes: Secondary | ICD-10-CM

## 2017-08-18 DIAGNOSIS — K920 Hematemesis: Secondary | ICD-10-CM

## 2017-08-18 DIAGNOSIS — R945 Abnormal results of liver function studies: Secondary | ICD-10-CM

## 2017-08-18 DIAGNOSIS — D696 Thrombocytopenia, unspecified: Secondary | ICD-10-CM

## 2017-08-18 LAB — PREPARE PLATELET PHERESIS: UNIT DIVISION: 0

## 2017-08-18 LAB — BPAM FFP
BLOOD PRODUCT EXPIRATION DATE: 201809222359
Blood Product Expiration Date: 201809222359
Blood Product Expiration Date: 201809222359
ISSUE DATE / TIME: 201809170152
ISSUE DATE / TIME: 201809170305
ISSUE DATE / TIME: 201809171153
UNIT TYPE AND RH: 5100
UNIT TYPE AND RH: 7300
Unit Type and Rh: 5100

## 2017-08-18 LAB — FIBRINOGEN: Fibrinogen: 225 mg/dL (ref 210–475)

## 2017-08-18 LAB — HEPATIC FUNCTION PANEL
ALK PHOS: 68 U/L (ref 38–126)
ALT: 596 U/L — ABNORMAL HIGH (ref 17–63)
AST: 582 U/L — ABNORMAL HIGH (ref 15–41)
Albumin: 2.3 g/dL — ABNORMAL LOW (ref 3.5–5.0)
BILIRUBIN INDIRECT: 1.6 mg/dL — AB (ref 0.3–0.9)
Bilirubin, Direct: 1.9 mg/dL — ABNORMAL HIGH (ref 0.1–0.5)
TOTAL PROTEIN: 4.8 g/dL — AB (ref 6.5–8.1)
Total Bilirubin: 3.5 mg/dL — ABNORMAL HIGH (ref 0.3–1.2)

## 2017-08-18 LAB — GLUCOSE, CAPILLARY
GLUCOSE-CAPILLARY: 250 mg/dL — AB (ref 65–99)
GLUCOSE-CAPILLARY: 193 mg/dL — AB (ref 65–99)
GLUCOSE-CAPILLARY: 186 mg/dL — AB (ref 65–99)
Glucose-Capillary: 237 mg/dL — ABNORMAL HIGH (ref 65–99)
Glucose-Capillary: 173 mg/dL — ABNORMAL HIGH (ref 65–99)

## 2017-08-18 LAB — PREPARE FRESH FROZEN PLASMA
UNIT DIVISION: 0
UNIT DIVISION: 0
Unit division: 0

## 2017-08-18 LAB — DIFFERENTIAL
BASOS PCT: 0 %
Basophils Absolute: 0 10*3/uL (ref 0–0.1)
EOS ABS: 0.9 10*3/uL — AB (ref 0–0.7)
Eosinophils Relative: 11 %
LYMPHS ABS: 0.6 10*3/uL — AB (ref 1.0–3.6)
Lymphocytes Relative: 8 %
MONO ABS: 1.1 10*3/uL — AB (ref 0.2–1.0)
Monocytes Relative: 14 %
NEUTROS ABS: 5.2 10*3/uL (ref 1.4–6.5)
Neutrophils Relative %: 67 %

## 2017-08-18 LAB — ECHOCARDIOGRAM COMPLETE: WEIGHTICAEL: 4571.46 [oz_av]

## 2017-08-18 LAB — BASIC METABOLIC PANEL
Anion gap: 5 (ref 5–15)
BUN: 95 mg/dL — AB (ref 6–20)
CALCIUM: 7.4 mg/dL — AB (ref 8.9–10.3)
CHLORIDE: 115 mmol/L — AB (ref 101–111)
CO2: 26 mmol/L (ref 22–32)
CREATININE: 2.01 mg/dL — AB (ref 0.61–1.24)
GFR calc Af Amer: 38 mL/min — ABNORMAL LOW (ref >60)
GFR calc non Af Amer: 33 mL/min — ABNORMAL LOW (ref >60)
Glucose, Bld: 264 mg/dL — ABNORMAL HIGH (ref 65–99)
Potassium: 3.3 mmol/L — ABNORMAL LOW (ref 3.5–5.1)
SODIUM: 146 mmol/L — AB (ref 135–145)

## 2017-08-18 LAB — HEPATITIS B DNA, ULTRAQUANTITATIVE, PCR
HBV DNA SERPL PCR-ACNC: NOT DETECTED [IU]/mL
HBV DNA SERPL PCR-LOG IU: UNDETERMINED {Log_IU}/mL

## 2017-08-18 LAB — BPAM PLATELET PHERESIS
Blood Product Expiration Date: 201809182359
ISSUE DATE / TIME: 201809171456
UNIT TYPE AND RH: 6200

## 2017-08-18 LAB — CBC
HCT: 22.2 % — ABNORMAL LOW (ref 40.0–52.0)
HEMOGLOBIN: 7.4 g/dL — AB (ref 13.0–18.0)
MCH: 28.9 pg (ref 26.0–34.0)
MCHC: 33.5 g/dL (ref 32.0–36.0)
MCV: 86.2 fL (ref 80.0–100.0)
Platelets: 43 10*3/uL — ABNORMAL LOW (ref 150–440)
RBC: 2.57 MIL/uL — ABNORMAL LOW (ref 4.40–5.90)
RDW: 18.6 % — AB (ref 11.5–14.5)
WBC: 7.8 10*3/uL (ref 3.8–10.6)

## 2017-08-18 LAB — APTT: APTT: 37 s — AB (ref 24–36)

## 2017-08-18 LAB — HSV(HERPES SIMPLEX VRS) I + II AB-IGM

## 2017-08-18 LAB — HEPATITIS B CORE ANTIBODY, IGM: Hep B C IgM: NEGATIVE

## 2017-08-18 LAB — PROTIME-INR
INR: 1.91
Prothrombin Time: 21.7 seconds — ABNORMAL HIGH (ref 11.4–15.2)

## 2017-08-18 LAB — HEPATITIS B E ANTIGEN: HEP B E AG: NEGATIVE

## 2017-08-18 LAB — EBV AB TO VIRAL CAPSID AG PNL, IGG+IGM
EBV VCA IgG: >600 U/mL — ABNORMAL HIGH (ref 0.0–17.9)
EBV VCA IgM: <36 U/mL (ref 0.0–35.9)

## 2017-08-18 LAB — PROCALCITONIN: PROCALCITONIN: 4.89 ng/mL

## 2017-08-18 LAB — MAGNESIUM: MAGNESIUM: 2.2 mg/dL (ref 1.7–2.4)

## 2017-08-18 LAB — HEPATITIS A ANTIBODY, IGM: Hep A IgM: NEGATIVE

## 2017-08-18 LAB — HEPATITIS B E ANTIBODY: Hep B E Ab: NEGATIVE

## 2017-08-18 LAB — FIBRIN DERIVATIVES D-DIMER (ARMC ONLY): FIBRIN DERIVATIVES D-DIMER (ARMC): 6795.09 — AB (ref 0.00–499.00)

## 2017-08-18 MED ORDER — PANTOPRAZOLE SODIUM 40 MG IV SOLR
40.0000 mg | Freq: Two times a day (BID) | INTRAVENOUS | Status: DC
  Administered 2017-08-18 – 2017-08-31 (×27): 40 mg via INTRAVENOUS
  Filled 2017-08-18 (×28): qty 40

## 2017-08-18 MED ORDER — SODIUM CHLORIDE 0.9 % IV SOLN
2.0000 g | Freq: Two times a day (BID) | INTRAVENOUS | Status: DC
  Administered 2017-08-18 – 2017-08-21 (×7): 2 g via INTRAVENOUS
  Filled 2017-08-18 (×9): qty 2

## 2017-08-18 MED ORDER — POTASSIUM CHLORIDE 10 MEQ/50ML IV SOLN
10.0000 meq | INTRAVENOUS | Status: AC
  Administered 2017-08-18 (×2): 10 meq via INTRAVENOUS
  Filled 2017-08-18 (×2): qty 50

## 2017-08-18 MED ORDER — POTASSIUM CHLORIDE 2 MEQ/ML IV SOLN
INTRAVENOUS | Status: DC
  Administered 2017-08-18 – 2017-08-20 (×4): via INTRAVENOUS
  Filled 2017-08-18 (×6): qty 1000

## 2017-08-18 MED ORDER — INSULIN GLARGINE 100 UNIT/ML ~~LOC~~ SOLN
10.0000 [IU] | Freq: Every day | SUBCUTANEOUS | Status: DC
  Administered 2017-08-18 – 2017-08-19 (×2): 10 [IU] via SUBCUTANEOUS
  Filled 2017-08-18 (×3): qty 0.1

## 2017-08-18 MED ORDER — DEXMEDETOMIDINE HCL IN NACL 400 MCG/100ML IV SOLN
0.4000 ug/kg/h | INTRAVENOUS | Status: DC
  Administered 2017-08-18: 0.5 ug/kg/h via INTRAVENOUS
  Administered 2017-08-18: 1 ug/kg/h via INTRAVENOUS
  Filled 2017-08-18: qty 100

## 2017-08-18 MED ORDER — FUROSEMIDE 10 MG/ML IJ SOLN
40.0000 mg | Freq: Once | INTRAMUSCULAR | Status: AC
  Administered 2017-08-18: 40 mg via INTRAVENOUS
  Filled 2017-08-18: qty 4

## 2017-08-18 NOTE — Progress Notes (Signed)
PT Hold Note  Patient Details Name: Jesus [name] MRN: [id number] DOB: 1951-05-02  Evaluation Hold:    Reason Eval/Treat Not Completed: Medical issues which prohibited therapy. Chart reviewed and RN consulted. Pt just extubated this AM. Will attempt PT evaluation tomorrow, 08/19/17, after pt has stabilized post-extubation. RN agrees.  Sharalyn Ink Shauni Henner PT, DPT   Lucilia Yanni 08/18/2017, 10:47 AM

## 2017-08-18 NOTE — Progress Notes (Signed)
Jesus Bellows MD, MRCP(U.K) [address]  [place]  [place], [place] [address]  Main: Bouse is being followed for upper GI bleed  Day 1 of follow up   Subjective: No hematemesis.    Objective: Vital signs in last 24 hours: Vitals:   08/18/17 0900 08/18/17 1000 08/18/17 1100 08/18/17 1200  BP: 121/68 127/80 (!) 126/94 117/70  Pulse: 97 (!) 103 100 (!) 103  Resp: (!) 9 11 10 11   Temp: 98.4 F (36.9 C) 98.4 F (36.9 C) 98.2 F (36.8 C)   TempSrc:      SpO2: 98% 100% 100% 100%  Weight:       Weight change:   Intake/Output Summary (Last 24 hours) at 08/18/17 1427 Last data filed at 08/18/17 1247  Gross per 24 hour  Intake          2992.03 ml  Output             3350 ml  Net          -357.97 ml     Exam: Heart:: Regular rate and rhythm, S1S2 present or without murmur or extra heart sounds Lungs: normal, clear to auscultation and clear to auscultation and percussion Abdomen: soft, nontender, normal bowel sounds  Alert but not orienatted   Lab Results: @LABTEST2 @ Micro Results: Recent Results (from the past 240 hour(s))  Blood Culture (routine x 2)     Status: None (Preliminary result)   Collection Time: 08/16/17 12:50 AM  Result Value Ref Range Status   Specimen Description BLOOD LEFT ARM  Final   Special Requests   Final    BOTTLES DRAWN AEROBIC AND ANAEROBIC Blood Culture adequate volume   Culture NO GROWTH 2 DAYS  Final   Report Status PENDING  Incomplete  Blood Culture (routine x 2)     Status: None (Preliminary result)   Collection Time: 08/16/17 12:51 AM  Result Value Ref Range Status   Specimen Description BLOOD LEFT ANTECUBITAL  Final   Special Requests   Final    BOTTLES DRAWN AEROBIC AND ANAEROBIC Blood Culture adequate volume   Culture NO GROWTH 2 DAYS  Final   Report Status PENDING  Incomplete  Urine culture     Status: Abnormal   Collection Time: 08/16/17 12:51 AM  Result Value Ref Range Status   Specimen  Description URINE, RANDOM  Final   Special Requests NONE  Final   Culture MULTIPLE SPECIES PRESENT, SUGGEST RECOLLECTION (A)  Final   Report Status 08/17/2017 FINAL  Final  MRSA PCR Screening     Status: None   Collection Time: 08/16/17  4:56 AM  Result Value Ref Range Status   MRSA by PCR NEGATIVE NEGATIVE Final    Comment:        The GeneXpert MRSA Assay (FDA approved for NASAL specimens only), is one component of a comprehensive MRSA colonization surveillance program. It is not intended to diagnose MRSA infection nor to guide or monitor treatment for MRSA infections.    Studies/Results: Dg Chest Port 1 View  Result Date: 08/18/2017 CLINICAL DATA:  66 year old male with respiratory failure. Subsequent encounter. EXAM: PORTABLE CHEST 1 VIEW COMPARISON:  08/17/2017 FINDINGS: Endotracheal tube tip 2.3 cm above the carina. Nasogastric tube is seen to level of the distal esophagus. Beyond this region, evaluation limited by technique. Cardiomegaly.  Prominent mediastinum unchanged. Fairly symmetric airspace disease unchanged suggestive of pulmonary edema. IMPRESSION: Similar appearance of airspace disease suggestive of pulmonary  edema. Cardiomegaly. Prominent mediastinum unchanged. Endotracheal tube tip 2.3 cm above the carina. Full extent of the nasogastric tube not visualized secondary to technique. Electronically Signed   By: Genia Del M.D.   On: 08/18/2017 06:27   Dg Chest Port 1 View  Result Date: 08/17/2017 CLINICAL DATA:  Endotracheal tube placement EXAM: PORTABLE CHEST 1 VIEW COMPARISON:  08/16/2017 FINDINGS: Endotracheal tube tip measures 4.6 cm above the carina. Enteric tube tip is off the field of view and is not visualized but is below the left hemidiaphragm. Shallow inspiration. Cardiac enlargement with pulmonary vascular congestion. Developing perihilar infiltration since previous study suggesting developing edema. Small right pleural effusion. No pneumothorax. IMPRESSION:  Appliances appear in satisfactory position. Cardiac enlargement with developing perihilar edema and small right pleural effusion. Electronically Signed   By: Lucienne Capers M.D.   On: 08/17/2017 21:26   US Liver Doppler  Result Date: 08/17/2017 CLINICAL DATA:  Elevated LFTs EXAM: DUPLEX ULTRASOUND OF LIVER TECHNIQUE: Color and duplex Doppler ultrasound was performed to evaluate the hepatic in-flow and out-flow vessels. COMPARISON:  08/17/2017 FINDINGS: Portal Vein Velocities Main:  27 cm/sec Right:  14 cm/sec Left:  13 cm/sec Hepatic Vein Velocities Right:  24 cm/sec Middle:  44 cm/sec Left:  50 cm/sec Hepatic Artery Velocity:  186 cm/sec Splenic Vein Velocity:  13 cm/sec Varices: None visualized Ascites: Small amount of abdominal ascites Limited exam because of patient's medical condition and being in the ICU intubated. Portal, hepatic and splenic veins appear grossly patent with normal directional flow. Spleen is enlarged measuring 15.3 x 15.3 x 7.7 cm consistent with a volume of 947 cc Negative for portal or splenic vein occlusion. Small amount of upper abdominal ascites. IMPRESSION: Limited but grossly normal hepatic Doppler. Splenomegaly Small amount of ascites Electronically Signed   By: Jerilynn Mages.  Shick M.D.   On: 08/17/2017 15:56   US Abdomen Limited Ruq  Result Date: 08/17/2017 CLINICAL DATA:  Abnormal LFTs. EXAM: ULTRASOUND ABDOMEN LIMITED RIGHT UPPER QUADRANT TECHNIQUE: Standard sonographic evaluation the right upper abdominal quadrant, followed by the acquisition of color and duplex Doppler ultrasound was performed to evaluate the hepatic in-flow and out-flow vessels. COMPARISON:  CT abdomen pelvis -08/12/2010 FINDINGS: Gallbladder: Several echogenic gallstones a minimal amount of mixed echogenic suspected biliary sludge is seen within the gallbladder. Note is made of a small amount of pericholecystic fluid, a nonspecific finding in the setting of intra-abdominal ascites. No definitive gallbladder  wall thickening. Common bile duct: Diameter: Normal in size measuring 4 mm in diameter Liver: There is diffuse increased slightly coarsened echogenicity of the hepatic parenchyma. Note is made of mild nodularity of the hepatic contour. No discrete hepatic lesions though note sonographic evaluation degraded secondary to patient's intubated state. Portal Vein Velocities Main:  17.8 cm/sec Right:  13.7 cm/sec Left:  13.4 cm/sec Hepatic Vein Velocities Right:  23.9 cm/sec Middle:  44.1 cm/sec Left:  50.5 cm/sec Hepatic Artery Velocity:  186.3 cm/sec Splenic Vein Velocity:  13.2 cm/sec Varices: None identified Ascites: Small amount of perihepatic and intra-abdominal ascites. The spleen is enlarged measuring 15.3 x 15.3 x 7.7 cm with calculated volume of 947.5 cm cubed). IMPRESSION: 1. Findings worrisome for hepatic cirrhosis and portal venous hypertension including nodular hepatic contour, splenomegaly and small volume intra-abdominal ascites. 2. Hepatic vascular system appears widely patent with normal directional flow. Electronically Signed   By: Sandi Mariscal M.D.   On: 08/17/2017 16:06   Medications: I have reviewed the patient's current medications. Scheduled Meds: . chlorhexidine gluconate (MEDLINE  KIT)  15 mL Mouth Rinse BID  . insulin aspart  0-20 Units Subcutaneous Q4H  . insulin glargine  10 Units Subcutaneous QHS  . mouth rinse  15 mL Mouth Rinse 10 times per day  . pantoprazole (PROTONIX) IV  40 mg Intravenous Q12H  . phytonadione  10 mg Subcutaneous Daily  . sodium chloride flush  10-40 mL Intracatheter Q12H   Continuous Infusions: . dextrose 5 % with kcl 50 mL/hr at 08/18/17 0948  . meropenem (MERREM) IV Stopped (08/18/17 1112)  . norepinephrine (LEVOPHED) 45m / 2593minfusion 2 mcg/min (08/18/17 0947)  . octreotide  (SANDOSTATIN)    IV infusion 50 mcg/hr (08/18/17 0234)   PRN Meds:.bisacodyl, [DISCONTINUED] ondansetron **OR** ondansetron (ZOFRAN) IV, sodium chloride  flush   Assessment: Active Problems:   Cardiac arrest (HCC)   Hemorrhagic shock (HCC)   [place] ([place] [place] [place]  Hepatic cirrhosis (HCC)   [facility] to [facility]H ([facility]  Hematemesis   Palliative care encounter   DNR (do not resuscitate) discussion  BrDwan Bolt64.o. male admitted to the ICU with PEA arrest x 2 , acute on chronic [place]. H/o liver cirrhosis . On this admission noted to have a possible shocked liver, on pressors , received blood. 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 Clinically he is not bleeding. Mental status definetly better , looks much better today     1. Variceal bleed- stable, continue antibiotics for 5 days, Octreotide for a total of 72 hours after banding. Can commence on clears after SLP assesment. Stop all alcohol. Repeat EGD as outpatient in 2-3 weeks for eradication of all varices. Commence on nadolol 20 mg closer to discharge     2. Acute liver failure- likely from shocked liver but will need to r/o acute hepatitis. F/u  acute Hepatitis panel , EBV,CMV,HSV,VZV, Vitamin K to reverse coagulopathy, usg doppler to r/o thrombosis of hepatic vasculature. Check Tylenol levels on admission labs. INR is improving . USG doppler shows no thrombosis.      LOS: 2 days   KiJonathon [name]/18/2018, 2:27 PM

## 2017-08-18 NOTE — Progress Notes (Signed)
Has tolerated extubation without stridor or desaturation. Slowly more alert. Recognizes family. Speech clearer this pm .Answers questions with less hesitation. Family in and out. Pallative care in to speak with family concerning ongoing code status. Family un sure at this time. Remains a full code for now.

## 2017-08-18 NOTE — Progress Notes (Signed)
Patient ID: Jesus [name], male   DOB: March 09, 1951, 66 y.o.   MRN: [id number]   [employer] PROGRESS NOTE  EVEREST BROD UQJ:[id number] DOB: 01/17/1951 DOA: 08/16/2017 PCP: System, Pcp Not In  HPI/Subjective: Patient extubated today.  Very slow with answers. Only able to say yes or no in responses. I was unable to get him to move his right side. Able to wiggle fingers and toes on the left side.  Objective: Vitals:   08/18/17 1000 08/18/17 1100  BP: 127/80 (!) 126/94  Pulse: (!) 103 100  Resp: 11 10  Temp: 98.4 F (36.9 C) 98.2 F (36.8 C)  SpO2: 100% 100%    Filed Weights   08/16/17 0045 08/18/17 0247  Weight: 123.4 kg (272 lb) 129.6 kg (285 lb 11.5 oz)    ROS: Review of Systems  Unable to perform ROS: Acuity of condition  Respiratory: Negative for shortness of breath.   Cardiovascular: Negative for chest pain.  Gastrointestinal: Negative for abdominal pain.   Exam: Physical Exam  Eyes: Pupils are equal, round, and reactive to light. Lids are normal.  icteric  Neck: No JVD present. Carotid bruit is not present. No edema present. No thyroid mass and no thyromegaly present.  Cardiovascular: S1 normal and S2 normal.  Tachycardia present.  Exam reveals no gallop.   Murmur heard.  Systolic murmur is present with a grade of 3/6  Pulses:      Dorsalis pedis pulses are 1+ on the right side, and 1+ on the left side.  Respiratory: No respiratory distress. He has decreased breath sounds in the right lower field and the left lower field. He has no wheezes. He has rhonchi in the right lower field and the left lower field. He has no rales.  Upper airway wheeze  GI: Soft. Bowel sounds are normal. There is no tenderness.  Musculoskeletal:       Right ankle: He exhibits swelling.       Left ankle: He exhibits swelling.  Lymphadenopathy:    He has no cervical adenopathy.  Neurological: He is alert.  Able to wiggle toes and fingers on the left side.  Unable to get him to move  anything on the right side.  Skin: Skin is warm. Nails show no clubbing.  Psychiatric: His speech is delayed.  Patient slow with answers      Data Reviewed: Basic Metabolic Panel:  Recent Labs Lab 08/16/17 0456  08/16/17 1745 08/16/17 1941 08/17/17 0011 08/17/17 0555 08/18/17 0405  NA 143  < > 145 146* 144 143 146*  K 4.3  < > 3.6 3.6 3.5 3.9 3.3*  CL 105  < > 112* 111 110 112* 115*  CO2 11*  < > _0 GLUCOSE 649*  < > 159* 130* 178* 228* 264*  BUN 89*  < > 96* 96* 97* 96* 95*  CREATININE 2.29*  < > 2.36* 2.37* 2.33* 2.30* 2.01*  CALCIUM 8.7*  < > 7.8* 8.0* 7.8* 7.6* 7.4*  MG 2.0  --   --   --  1.4* 1.3* 2.2  PHOS 7.8*  --   --   --   --  2.6  --   < > = values in this interval not displayed. Liver Function Tests:  Recent Labs Lab 08/16/17 0049 08/16/17 0456 08/18/17 0405  AST 122* 618* 582*  ALT 77* 392* 596*  ALKPHOS 67 109 68  BILITOT 1.2 1.3* 3.5*  PROT 5.0* 4.9* 4.8*  ALBUMIN  2.3* 2.2* 2.3*    Recent Labs Lab 08/16/17 0456  LIPASE 33  AMYLASE 145*    Recent Labs Lab 08/16/17 0051 08/16/17 1058 08/17/17 2129  AMMONIA 177* 325* 78*   CBC:  Recent Labs Lab 08/16/17 0049  08/16/17 1941 08/17/17 0011 08/17/17 0555 08/17/17 1629 08/18/17 0405  WBC 19.0*  < > 24.1* 18.0* 10.0 7.9 7.8  NEUTROABS 15.9*  --   --   --   --   --  5.2  HGB 5.8*  < > 7.8* 7.4* 7.3* 7.5* 7.4*  HCT 20.5*  < > 22.9* 21.9* 21.3* 22.6* 22.2*  MCV 99.6  < > 86.5 87.1 85.7 85.9 86.2  PLT 174  < > 88* 66* 41* 33* 43*  < > = values in this interval not displayed. Cardiac Enzymes:  Recent Labs Lab 08/16/17 0049 08/16/17 0456 08/16/17 0950 08/16/17 1745 08/17/17 1449  CKTOTAL 171  --   --   --   --   TROPONINI 0.06* 0.45* 6.53* 16.81* 5.41*    CBG:  Recent Labs Lab 08/17/17 2019 08/17/17 2326 08/18/17 0356 08/18/17 0730 08/18/17 1111  GLUCAP 323* 296* 250* 237* 173*    Recent Results (from the past 240 hour(s))  Blood Culture (routine x 2)      Status: None (Preliminary result)   Collection Time: 08/16/17 12:50 AM  Result Value Ref Range Status   Specimen Description BLOOD LEFT ARM  Final   Special Requests   Final    BOTTLES DRAWN AEROBIC AND ANAEROBIC Blood Culture adequate volume   Culture NO GROWTH 2 DAYS  Final   Report Status PENDING  Incomplete  Blood Culture (routine x 2)     Status: None (Preliminary result)   Collection Time: 08/16/17 12:51 AM  Result Value Ref Range Status   Specimen Description BLOOD LEFT ANTECUBITAL  Final   Special Requests   Final    BOTTLES DRAWN AEROBIC AND ANAEROBIC Blood Culture adequate volume   Culture NO GROWTH 2 DAYS  Final   Report Status PENDING  Incomplete  Urine culture     Status: Abnormal   Collection Time: 08/16/17 12:51 AM  Result Value Ref Range Status   Specimen Description URINE, RANDOM  Final   Special Requests NONE  Final   Culture MULTIPLE SPECIES PRESENT, SUGGEST RECOLLECTION (A)  Final   Report Status 08/17/2017 FINAL  Final  MRSA PCR Screening     Status: None   Collection Time: 08/16/17  4:56 AM  Result Value Ref Range Status   MRSA by PCR NEGATIVE NEGATIVE Final    Comment:        The GeneXpert MRSA Assay (FDA approved for NASAL specimens only), is one component of a comprehensive MRSA colonization surveillance program. It is not intended to diagnose MRSA infection nor to guide or monitor treatment for MRSA infections.      Studies: Dg Chest Port 1 View  Result Date: 08/18/2017 CLINICAL DATA:  66 year old male with respiratory failure. Subsequent encounter. EXAM: PORTABLE CHEST 1 VIEW COMPARISON:  08/17/2017 FINDINGS: Endotracheal tube tip 2.3 cm above the carina. Nasogastric tube is seen to level of the distal esophagus. Beyond this region, evaluation limited by technique. Cardiomegaly.  Prominent mediastinum unchanged. Fairly symmetric airspace disease unchanged suggestive of pulmonary edema. IMPRESSION: Similar appearance of airspace disease  suggestive of pulmonary edema. Cardiomegaly. Prominent mediastinum unchanged. Endotracheal tube tip 2.3 cm above the carina. Full extent of the nasogastric tube not visualized secondary to technique. Electronically Signed  By: Genia Del M.D.   On: 08/18/2017 06:27   Dg Chest Port 1 View  Result Date: 08/17/2017 CLINICAL DATA:  Endotracheal tube placement EXAM: PORTABLE CHEST 1 VIEW COMPARISON:  08/16/2017 FINDINGS: Endotracheal tube tip measures 4.6 cm above the carina. Enteric tube tip is off the field of view and is not visualized but is below the left hemidiaphragm. Shallow inspiration. Cardiac enlargement with pulmonary vascular congestion. Developing perihilar infiltration since previous study suggesting developing edema. Small right pleural effusion. No pneumothorax. IMPRESSION: Appliances appear in satisfactory position. Cardiac enlargement with developing perihilar edema and small right pleural effusion. Electronically Signed   By: Lucienne Capers M.D.   On: 08/17/2017 21:26   US Liver Doppler  Result Date: 08/17/2017 CLINICAL DATA:  Elevated LFTs EXAM: DUPLEX ULTRASOUND OF LIVER TECHNIQUE: Color and duplex Doppler ultrasound was performed to evaluate the hepatic in-flow and out-flow vessels. COMPARISON:  08/17/2017 FINDINGS: Portal Vein Velocities Main:  27 cm/sec Right:  14 cm/sec Left:  13 cm/sec Hepatic Vein Velocities Right:  24 cm/sec Middle:  44 cm/sec Left:  50 cm/sec Hepatic Artery Velocity:  186 cm/sec Splenic Vein Velocity:  13 cm/sec Varices: None visualized Ascites: Small amount of abdominal ascites Limited exam because of patient's medical condition and being in the ICU intubated. Portal, hepatic and splenic veins appear grossly patent with normal directional flow. Spleen is enlarged measuring 15.3 x 15.3 x 7.7 cm consistent with a volume of 947 cc Negative for portal or splenic vein occlusion. Small amount of upper abdominal ascites. IMPRESSION: Limited but grossly normal hepatic  Doppler. Splenomegaly Small amount of ascites Electronically Signed   By: Jerilynn Mages.  Shick M.D.   On: 08/17/2017 15:56   US Abdomen Limited Ruq  Result Date: 08/17/2017 CLINICAL DATA:  Abnormal LFTs. EXAM: ULTRASOUND ABDOMEN LIMITED RIGHT UPPER QUADRANT TECHNIQUE: Standard sonographic evaluation the right upper abdominal quadrant, followed by the acquisition of color and duplex Doppler ultrasound was performed to evaluate the hepatic in-flow and out-flow vessels. COMPARISON:  CT abdomen pelvis -08/12/2010 FINDINGS: Gallbladder: Several echogenic gallstones a minimal amount of mixed echogenic suspected biliary sludge is seen within the gallbladder. Note is made of a small amount of pericholecystic fluid, a nonspecific finding in the setting of intra-abdominal ascites. No definitive gallbladder wall thickening. Common bile duct: Diameter: Normal in size measuring 4 mm in diameter Liver: There is diffuse increased slightly coarsened echogenicity of the hepatic parenchyma. Note is made of mild nodularity of the hepatic contour. No discrete hepatic lesions though note sonographic evaluation degraded secondary to patient's intubated state. Portal Vein Velocities Main:  17.8 cm/sec Right:  13.7 cm/sec Left:  13.4 cm/sec Hepatic Vein Velocities Right:  23.9 cm/sec Middle:  44.1 cm/sec Left:  50.5 cm/sec Hepatic Artery Velocity:  186.3 cm/sec Splenic Vein Velocity:  13.2 cm/sec Varices: None identified Ascites: Small amount of perihepatic and intra-abdominal ascites. The spleen is enlarged measuring 15.3 x 15.3 x 7.7 cm with calculated volume of 947.5 cm cubed). IMPRESSION: 1. Findings worrisome for hepatic cirrhosis and portal venous hypertension including nodular hepatic contour, splenomegaly and small volume intra-abdominal ascites. 2. Hepatic vascular system appears widely patent with normal directional flow. Electronically Signed   By: Sandi Mariscal M.D.   On: 08/17/2017 16:06    Scheduled Meds: . chlorhexidine gluconate  (MEDLINE KIT)  15 mL Mouth Rinse BID  . insulin aspart  0-20 Units Subcutaneous Q4H  . mouth rinse  15 mL Mouth Rinse 10 times per day  . pantoprazole (PROTONIX) IV  40 mg Intravenous Q12H  . phytonadione  10 mg Subcutaneous Daily  . sodium chloride flush  10-40 mL Intracatheter Q12H   Continuous Infusions: . dextrose 5 % with kcl 50 mL/hr at 08/18/17 0948  . meropenem (MERREM) IV Stopped (08/18/17 1112)  . norepinephrine (LEVOPHED) 4m / 2550minfusion 2 mcg/min (08/18/17 0947)  . octreotide  (SANDOSTATIN)    IV infusion 50 mcg/hr (08/18/17 0234)    Assessment/Plan:  1. Cardiac arrest. Likely secondary to respiratory arrest and/or GI bleed. 2. Hemorrhagic shock versus septic shock. Patient off all pressors. Empiric antibiotics with meropenem. Cultures are negative. 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 Continue octreotide drip and Protonix. 3. Acute hypoxic respiratory failure. Extubated today. Continue nasal cannula oxygen. 4. Metabolic acidosis and lactic acidosis secondary to respiratory failure, shock. 5. Severe anemia. Continue serial hemoglobins and transfuse as necessary.  Today's hemoglobin 7.4 6. Acute kidney injury. Continue to monitor closely for worsening kidney function 7. Elevated liver function tests likely secondary to cardiac arrest. 8. Liver cirrhosis seen on ultrasound 9. Anoxic encephalopathy. Weakness on the right side. Can consider CT scan of the head if doesn't improve.  10. Empiric Vitamin K given for high INR. This likely won't improve if it secondary to the liver.  Code Status:     Code Status Orders        Start     Ordered   08/16/17 0405  Full code  Continuous     08/16/17 0404    Code Status History    Date Active Date Inactive Code Status Order ID Comments User Context   05/28/2017  5:15 AM 06/04/2017  8:10 PM Full Code [id number] ForemanMD Inpatient     Family Communication: as per critical care  specialist Disposition Plan: patient is critically ill  Consultants:  Critical care specialist  gastroenterology  Procedures:  Upper endoscopy  Antibiotics:  meropenem  Time spent: 24 minutes  WILoletha GrayerSoBig Lots

## 2017-08-18 NOTE — Progress Notes (Signed)
*  PRELIMINARY RESULTS* Echocardiogram 2D Echocardiogram has been performed.  Cristela Blue 08/18/2017, 10:50 AM

## 2017-08-18 NOTE — Progress Notes (Signed)
Brief Nutrition Follow Up Note   Pt extubated today; on nasal canula. OGT removed. Plan for SLP evaluation 9/19. RD will follow up after SLP evaluation and order supplements if needed.   Betsey Holiday MS, RD, LDN Pager #- [phone] After Hours Pager: [phone]

## 2017-08-18 NOTE — Progress Notes (Signed)
Becoming slowly more alert as the day progresses. Wife and daughters in and out.

## 2017-08-18 NOTE — Progress Notes (Addendum)
Inpatient Diabetes Program Recommendations  AACE/ADA: New Consensus Statement on Inpatient Glycemic Control (2015)  Target Ranges:  Prepandial:   less than 140 mg/dL      Peak postprandial:   less than 180 mg/dL (1-2 hours)      Critically ill patients:  140 - 180 mg/dL   Lab Results  Component Value Date   GLUCAP 237 (H) 08/18/2017   HGBA1C 9.9 (H) 05/28/2017    Review of Glycemic Control  Results for ELMO, RIO (MRN [id number]) as of 08/18/2017 09:24  Ref. Range 08/17/2017 16:13 08/17/2017 20:19 08/17/2017 23:26 08/18/2017 03:56 08/18/2017 07:30  Glucose-Capillary Latest Ref Range: 65 - 99 mg/dL 253 (H) 664 (H) 403 (H) 250 (H) 237 (H)   Diabetes history: DM2 Outpatient Diabetes medications: Lantus 34 units daily, Novolog 7 units TID with meals, Metformin 1000 mg BID, Glipizide 5 mg daily Current orders for Inpatient glycemic control: Novolog 0-20 units Q4H  Inpatient Diabetes Program Recommendations:         Per protocol, If there is one CBG > 250 mg/dL or two subsequent CBG > 200 mg/dL a. Search for ICU Glycemic Control Order Set (Phase 2) in Order Management under Order Sets and place orders per protocol, cosign required. b. In Order Management select Active Orders and View by Order Set. Discontinue ALL orders from ICU Glycemic Control Order Set (Phase 1) per protocol, cosign required.  If IV insulin is not chosen, consider giving Lantus 19 units starting now (based on 129.6kg x 0.15 units)- continue Novolog as ordered.    Susette Racer, RN, BA, MHA, CDE Diabetes Coordinator Inpatient Diabetes Program  [phone] (Team Pager) [phone] [facility] Office) 08/18/2017 9:30 AM

## 2017-08-18 NOTE — Procedures (Signed)
Endotracheal Intubation Procedure Note  Indication for endotracheal intubation: airway compromise. Airway Assessment: Mallampati Class: II (hard and soft palate, upper portion of tonsils anduvula visible). Sedation: fentanyl and midazolam. Paralytic: rocuronium. Lidocaine: no. Atropine: no. Equipment: glidescope utilized . Cricoid Pressure: no. Number of attempts: 1. ETT location confirmed by by auscultation, by CXR and ETCO2 monitor.  Endotracheal tube exchanged due to air leak pt tolerated procedure  Jesus [name], AGNP  Pulmonary/Critical Care Pager [phone] (please enter 7 digits) PCCM Consult Pager [phone] (please enter 7 digits)  Jesus Fischer, MD PCCM service Mobile [phone] Pager [phone] 08/18/2017 12:46 PM

## 2017-08-18 NOTE — Progress Notes (Addendum)
PULMONARY / CRITICAL CARE MEDICINE   Name: Jesus [name] MRN: [id number] DOB: 1951/05/15    ADMISSION DATE:  08/16/2017   PT PROFILE: 69 M with hx of cirrhosis and chronic iron def anemia adm via ED 09/16 when he presented hematemesis then suffered PEA arrest in ED and 40 mins ACLS.   MAJOR EVENTS/TEST RESULTS: 09/16 adm with above history. Intubated during ACLS. After ROSC, required vasopressors.  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 Treated with argon plasma coagulation. Bleeding grade II esophageal varices. Incompletely eradicated. Banded. 09/17 Cardiac markers elevated (trop I 16.81) 09/17 RUQ US/Liver Doppler: Findings worrisome for hepatic cirrhosis and portal venous hypertension including nodular hepatic contour, splenomegaly and small volume intra-abdominal ascites. Hepatic vascular system appears widely patent with normal directional flow. Limited but grossly normal hepatic Doppler. Splenomegaly 09/18 Passed SBT and extubated. Off vasopressors 09/18 Echocardiogram:   INDWELLING DEVICES:: ETT 09/16 >> 09/18 L femoral CVL 09/16 >>  R femoral A-line 09/16 >>   MICRO DATA: MRSA PCR 09/16 >> NEG Urine 09/16 >> multiple species Blood 09/16 >>   ANTIMICROBIALS:  Vanc  09/16 >> 09/17 Metronidazole 09/16 >> 09/17 Meropenem 09/16 >>   PCT  09/16: 0.78, 3.93, 4.89,      SUBJECTIVE:  RASS 0, + F/C intermittently  VITAL SIGNS: BP 117/70   Pulse (!) 103   Temp 98.2 F (36.8 C)   Resp 11   Wt 129.6 kg (285 lb 11.5 oz)   SpO2 100%   BMI 43.44 kg/m   HEMODYNAMICS:    VENTILATOR SETTINGS: Vent Mode: Spontaneous FiO2 (%):  [30 %-35 %] 30 % Set Rate:  [20 bmp-40 bmp] 20 bmp Vt Set:  [500 mL] 500 mL PEEP:  [5 cmH20] 5 cmH20 Pressure Support:  [5 cmH20] 5 cmH20 Plateau Pressure:  [16 cmH20-20 cmH20] 16 cmH20  INTAKE / OUTPUT: I/O last 3 completed shifts: In: 8936 [I.V.:5400;  Blood:2056; Other:220; NG/GT:210; IV Piggyback:1050] Out: 5458 [Urine:2558; Emesis/NG output:200; Stool:2700]  PHYSICAL EXAMINATION: General: intubated, sedated Neuro: MAEs, CNs intact, + F/C HEENT: NCAT, + scericterus Cardiovascular: reg, + syst M of aortic origin Lungs: assistant anteriorly Abdomen: Mildly distended, soft, bowel sounds present Extremities: Cool, diminished pulses, BUE edema  LABS:  BMET  Recent Labs Lab 08/17/17 0011 08/17/17 0555 08/18/17 0405  NA 144 143 146*  K 3.5 3.9 3.3*  CL 110 112* 115*  CO2 BUN 97* 96* 95*  CREATININE 2.33* 2.30* 2.01*  GLUCOSE 178* 228* 264*    Electrolytes  Recent Labs Lab 08/16/17 0456  08/17/17 0011 08/17/17 0555 08/18/17 0405  CALCIUM 8.7*  < > 7.8* 7.6* 7.4*  MG 2.0  --  1.4* 1.3* 2.2  PHOS 7.8*  --   --  2.6  --   < > = values in this interval not displayed.  CBC  Recent Labs Lab 08/17/17 0555 08/17/17 1629 08/18/17 0405  WBC 10.0 7.9 7.8  HGB 7.3* 7.5* 7.4*  HCT 21.3* 22.6* 22.2*  PLT 41* 33* 43*    Coag's  Recent Labs Lab 08/16/17 0950 08/16/17 1941 08/17/17 1629 08/18/17 0405  APTT 43*  --   --  37*  INR 2.84 2.23 2.08 1.91    Sepsis Markers  Recent Labs Lab 08/16/17 0051 08/16/17 0422 08/17/17 0555 08/18/17 0405  LATICACIDVEN 21.9* 18.6* 4.1*  --   PROCALCITON  --  0.78 3.93 4.89    ABG  Recent Labs Lab 08/16/17 0630 08/16/17 1417 08/17/17 0500  PHART 7.10* 7.41 7.37  PCO2ART 41 40 37  PO2ART 105 94 75*    Liver Enzymes  Recent Labs Lab 08/16/17 0049 08/16/17 0456 08/18/17 0405  AST 122* 618* 582*  ALT 77* 392* 596*  ALKPHOS 67 109 68  BILITOT 1.2 1.3* 3.5*  ALBUMIN 2.3* 2.2* 2.3*    Cardiac Enzymes  Recent Labs Lab 08/16/17 0950 08/16/17 1745 08/17/17 1449  TROPONINI 6.53* 16.81* 5.41*    Glucose  Recent Labs Lab 08/17/17 1613 08/17/17 2019 08/17/17 2326 08/18/17 0356 08/18/17 0730 08/18/17 1111  GLUCAP 313* 323* 296* 250*  237* 173*    CXR: cardiomegaly, edema pattern   ASSESSMENT / PLAN:  PULMONARY A: VDRF after cardiac arrest Pulmonary edema P:   Extubated this a.m. under my direction Supplmental oxygen to keep SPO2 > 90%  CARDIOVASCULAR A:  Hemorrhagic/hypovolemic shock - resolved Possible component of cardiogenic shock - resolved Elevated troponin I - likely supply/demand imbalance P:  MAP goal > 65 mmHg Echocardiogram performed today Consider cardiology consultation prior to discharge  RENAL A:   AKI, nonoliguric Hypernatremia Hypokalemia P:   Monitor BMET intermittently Monitor I/Os Correct electrolytes as indicated  IVS adjusted  GASTROINTESTINAL A:   Cirrhosis with elevated LFTs UGIB - gastric angioectasias and esophageal varices  No evidence of active bleeding currently Post extubation dysphagia P:   Gastroenterology following Change pantoprazole to 40 mg every 12 hours Continue octreotide infusion NPO for now SLP consultation requested for 09/19  HEMATOLOGIC A:   Acute blood loss anemia Coagulopathy due to liver failure Thrombocytopenia P:  Transfuse blood products per usual guidelines Continue Vitamin K SQ 3 doses as ordered DIC panel ordered for 09/18  INFECTIOUS A:   Elevated PCT, no obvious infection High risk SBP P:   Monitor temp, WBC count Micro and abx as above Recheck PCT 09/19  ENDOCRINE A:   DM 2 Severe hyperglycemia - improved P:   Continue SSI, resistant scale Add Lantus 09/18  NEUROLOGIC A:   Anoxic encephalopathy ICU/ventilator associated discomfort P:   RASS goal: 0 PAD protocol discontinued after extubation   FAMILY: There is no family at the bedside during my rounds this morning.   CCM time: 45 mins The above time includes time spent in consultation with patient and/or family members and reviewing care plan on multidisciplinary rounds and discussing with Dr. Pilar Jarvis, MD PCCM service Mobile  [phone] Pager [phone]    08/18/2017, 12:47 PM

## 2017-08-18 NOTE — Care Management (Signed)
Patient now extubated however still confused. I met with patient and his daughter to discuss [facility] forms/decision. She states she and her mother have not had time to read them but will follow up with [facility] on decision.

## 2017-08-18 NOTE — Progress Notes (Signed)
Extubated to 2 liters nasal cannula. 

## 2017-08-18 NOTE — Progress Notes (Signed)
Chaplain received a prayer request for ICU2. Chaplain responded as part of making normal rounds in ICU. Pt was intubated and was not coherent. Chaplain told pt that he was praying for he and his family and that if the family needed a chaplain, a chaplain was available for followup.    08/18/17 1000  Clinical Encounter Type  Visited With Patient  Visit Type Follow-up;Spiritual support  Referral From Nurse  Consult/Referral To Chaplain  Spiritual Encounters  Spiritual Needs Prayer

## 2017-08-18 NOTE — Progress Notes (Signed)
Daily Progress Note   Patient Name: Jesus [name]       Date: 08/18/2017 DOB: 07-Oct-1951  Age: 66 y.o. MRN#: [id number] Attending Physician: Loletha Grayer, MD Primary Care Physician: System, Pcp Not In Admit Date: 08/16/2017  Reason for Consultation/Follow-up: Establishing goals of care  Subjective: After examining the patient I met with the wife and two daughters for approximately 61 min.  Even though things are looking better we discussed DNR.  Family would NOT want him resuscitated in the event of cardiac or respiratory arrest.  However at this point they would want [place] to intubate if he was declining and needed it.  This presents a confusing situation for our nursing and provider staff.  Consequently the patient has been left full code.  I discussed this with Melissa (dtr)  Family does not want him transferred to the [place].  The pressure to move him has been a source of great stress to them. They are pleased with his care here and feel he is very fragile - to fragile to be moved at this point.    We discussed his mental status and next steps.  We reviewed his abd [place] and the echocardiogram. I explained that the cirrhosis / heart failure is a chronic progressive disease process.  We talked at length about going to SNF at discharge at his wife can not care for him at home.   Family was appreciative of the meeting and set an appointment to meet again tomorrow at 1 pm   Assessment:  66 y.o. male  with past medical history of NASH cirrhosis, diabetes mellitus, and recurrent anemia (receiving regular transfusions) who was admitted on 08/16/2017 with altered mental status and hyperglycemia.  At home he was found unresponsive.  He had been confused and vomiting blood.  In the ED he suffered cardiac  arrest x2 and was resuscitated. Admission work up showed severe anemia. His lactic acid was elevated (21), Ph 7.1 and he required emergent intubation.   Patient Profile/HPI:    Length of Stay: 2  Current Medications: Scheduled Meds:  . chlorhexidine gluconate (MEDLINE KIT)  15 mL Mouth Rinse BID  . insulin aspart  0-20 Units Subcutaneous Q4H  . insulin glargine  10 Units Subcutaneous QHS  . mouth rinse  15 mL Mouth Rinse 10 times per day  .  pantoprazole (PROTONIX) IV  40 mg Intravenous Q12H  . phytonadione  10 mg Subcutaneous Daily  . sodium chloride flush  10-40 mL Intracatheter Q12H    Continuous Infusions: . dextrose 5 % with kcl 50 mL/hr at 08/18/17 0948  . meropenem (MERREM) IV Stopped (08/18/17 1112)  . norepinephrine (LEVOPHED) 57m / 2528minfusion 2 mcg/min (08/18/17 0947)  . octreotide  (SANDOSTATIN)    IV infusion 50 mcg/hr (08/18/17 0234)    PRN Meds: bisacodyl, [DISCONTINUED] ondansetron **OR** ondansetron (ZOFRAN) IV, sodium chloride flush  Physical Exam        Well developed elderly man.  Extubated.  Slow to respond, but nods yes/no appropriately. CV tachy resp no distress Abdomen soft, nt  Vital Signs: BP 117/70   Pulse (!) 103   Temp 98.2 F (36.8 C)   Resp 11   Wt 129.6 kg (285 lb 11.5 oz)   SpO2 100%   BMI 43.44 kg/m  SpO2: SpO2: 100 % O2 Device: O2 Device: Ventilator O2 Flow Rate: O2 Flow Rate (L/min): 2 L/min  Intake/output summary:  Intake/Output Summary (Last 24 hours) at 08/18/17 1350 Last data filed at 08/18/17 1247  Gross per 24 hour  Intake          3061.53 ml  Output             3600 ml  Net          -538.47 ml   LBM: Last BM Date: 08/18/17 Baseline Weight: Weight: 123.4 kg (272 lb) Most recent weight: Weight: 129.6 kg (285 lb 11.5 oz)       Palliative Assessment/Data:      Patient Active Problem List   Diagnosis Date Noted  . Hemorrhagic shock (H[facility]  . AKI (acute kidney injury) (H[facility]  . Hepatic cirrhosis ([facility]  .  Liver cirrhosis secondary to NASH ([facility] [place]  . Hematemesis   . Palliative care encounter   . DNR (do not resuscitate) discussion   . Cardiac arrest (HCSinger09/16/2018  . 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 Sepsis (HCVinton06/28/2018    Palliative Care Plan    Recommendations/Plan:  PMT will continue to follow with you  Continue to discuss goals of care with family - next appt 9/19 at 1 pm.  Goals of Care and Additional Recommendations:  Limitations on Scope of Treatment: Full Scope Treatment  Code Status:  Full code - Although in the event of another cardiac arrest - family does not want him resuscitated.  Prognosis:  Weeks to months. Given liver failure, heart failure, kidney failure.  He is at significant risk for an acute adverse event that would take his life quickly particularly if he bleeds again.  Discharge Planning:  To Be Determined  Care plan was discussed with family and CCM team.  Thank you for allowing the Palliative Medicine Team to assist in the care of this patient.  Total time spent:  60 min.     Greater than 50%  of this time was spent counseling and coordinating care related to the above assessment and plan.  MaFlorentina JennyPA-C Palliative Medicine  Please contact Palliative MedicineTeam phone at [phone] questions and concerns between 7 am - 7 pm.   Please see AMION for individual provider pager numbers.

## 2017-08-18 NOTE — Progress Notes (Signed)
Patient extubated and placed on 2lpm [place].  Patient tolerating well.  HR 104  RR 10  SpO2 100%.

## 2017-08-18 NOTE — Progress Notes (Signed)
Pharmacy Antibiotic Note  Jesus [name] is a 66 y.o. male admitted on 08/16/2017 with sepsis.  Pharmacy has been consulted for meropenem dosing.   PCT increasing, SCr decreasing, and varices cannot be r/o by EGD per GI.   Plan: Will increase meropenem to 2 g iv q 12 hours.   Weight: 285 lb 11.5 oz (129.6 kg)  Temp (24hrs), Avg:98.6 F (37 C), Min:98.2 F (36.8 C), Max:99 F (37.2 C)   Recent Labs Lab 08/16/17 0051 08/16/17 0422  08/16/17 1745 08/16/17 1941 08/17/17 0011 08/17/17 0555 08/17/17 1629 08/18/17 0405  WBC  --   --   < >  --  24.1* 18.0* 10.0 7.9 7.8  CREATININE  --   --   < > 2.36* 2.37* 2.33* 2.30*  --  2.01*  LATICACIDVEN 21.9* 18.6*  --   --   --   --  4.1*  --   --   < > = values in this interval not displayed.  Estimated Creatinine Clearance: 47.5 mL/min (A) (by C-G formula based on SCr of 2.01 mg/dL (H)).    Allergies  Allergen Reactions  . Penicillins Hives and Other (See Comments)    Has patient had a PCN reaction causing immediate rash, facial/tongue/throat swelling, SOB or lightheadedness with hypotension: Yes Has patient had a PCN reaction causing severe rash involving mucus membranes or skin necrosis: No Has patient had a PCN reaction that required hospitalization: No Has patient had a PCN reaction occurring within the last 10 years: No If all of the above answers are "NO", then may proceed with Cephalosporin use.    ABX: 9/16 aztreonam x 1 9/16 Levaquin x 1 9/16 meropenem >>  9/16 vancomycin x 1  Micro:  9/16 MRSA PCR: negative 9/16 BCx: NGTD UCx: multiple species  Thank you for allowing pharmacy to be a part of this patient's care.  Luisa Hart, PharmD Clinical Pharmacist  08/18/2017

## 2017-08-19 ENCOUNTER — Inpatient Hospital Stay: Payer: Medicare PPO

## 2017-08-19 DIAGNOSIS — D649 Anemia, unspecified: Secondary | ICD-10-CM

## 2017-08-19 DIAGNOSIS — D62 Acute posthemorrhagic anemia: Secondary | ICD-10-CM

## 2017-08-19 DIAGNOSIS — Z66 Do not resuscitate: Secondary | ICD-10-CM

## 2017-08-19 DIAGNOSIS — G9340 Encephalopathy, unspecified: Secondary | ICD-10-CM

## 2017-08-19 DIAGNOSIS — K7200 Acute and subacute hepatic failure without coma: Secondary | ICD-10-CM

## 2017-08-19 DIAGNOSIS — D689 Coagulation defect, unspecified: Secondary | ICD-10-CM

## 2017-08-19 LAB — BASIC METABOLIC PANEL
ANION GAP: 5 (ref 5–15)
BUN: 81 mg/dL — ABNORMAL HIGH (ref 6–20)
CALCIUM: 7 mg/dL — AB (ref 8.9–10.3)
CO2: 26 mmol/L (ref 22–32)
Chloride: 119 mmol/L — ABNORMAL HIGH (ref 101–111)
Creatinine, Ser: 1.58 mg/dL — ABNORMAL HIGH (ref 0.61–1.24)
GFR calc Af Amer: 51 mL/min — ABNORMAL LOW (ref >60)
GFR, EST NON AFRICAN AMERICAN: 44 mL/min — AB (ref >60)
Glucose, Bld: 201 mg/dL — ABNORMAL HIGH (ref 65–99)
POTASSIUM: 3.3 mmol/L — AB (ref 3.5–5.1)
SODIUM: 150 mmol/L — AB (ref 135–145)

## 2017-08-19 LAB — BPAM PLATELET PHERESIS
Blood Product Expiration Date: 201809192359
ISSUE DATE / TIME: 201809180004
UNIT TYPE AND RH: 6200

## 2017-08-19 LAB — PROCALCITONIN: Procalcitonin: 3.33 ng/mL

## 2017-08-19 LAB — CBC
HEMATOCRIT: 13.8 % — AB (ref 40.0–52.0)
HEMATOCRIT: 29.2 % — AB (ref 40.0–52.0)
HEMOGLOBIN: 4.5 g/dL — AB (ref 13.0–18.0)
Hemoglobin: 9.8 g/dL — ABNORMAL LOW (ref 13.0–18.0)
MCH: 28.8 pg (ref 26.0–34.0)
MCH: 28.7 pg (ref 26.0–34.0)
MCHC: 32.4 g/dL (ref 32.0–36.0)
MCHC: 33.5 g/dL (ref 32.0–36.0)
MCV: 88.8 fL (ref 80.0–100.0)
MCV: 85.8 fL (ref 80.0–100.0)
Platelets: 30 10*3/uL — ABNORMAL LOW (ref 150–440)
Platelets: 46 10*3/uL — ABNORMAL LOW (ref 150–440)
RBC: 1.55 MIL/uL — AB (ref 4.40–5.90)
RBC: 3.4 MIL/uL — ABNORMAL LOW (ref 4.40–5.90)
RDW: 19.1 % — ABNORMAL HIGH (ref 11.5–14.5)
RDW: 18.4 % — AB (ref 11.5–14.5)
WBC: 4 10*3/uL (ref 3.8–10.6)
WBC: 6.8 10*3/uL (ref 3.8–10.6)

## 2017-08-19 LAB — DIFFERENTIAL
BASOS ABS: 0 10*3/uL (ref 0–0.1)
Basophils Relative: 0 %
EOS ABS: 0.6 10*3/uL (ref 0–0.7)
Eosinophils Relative: 14 %
LYMPHS ABS: 0.4 10*3/uL — AB (ref 1.0–3.6)
LYMPHS PCT: 9 %
MONO ABS: 0.5 10*3/uL (ref 0.2–1.0)
Monocytes Relative: 12 %
NEUTROS PCT: 65 %
Neutro Abs: 2.5 10*3/uL (ref 1.4–6.5)
nRBC: 2 /100 WBC — ABNORMAL HIGH

## 2017-08-19 LAB — GLUCOSE, CAPILLARY
GLUCOSE-CAPILLARY: 161 mg/dL — AB (ref 65–99)
GLUCOSE-CAPILLARY: 177 mg/dL — AB (ref 65–99)
Glucose-Capillary: 139 mg/dL — ABNORMAL HIGH (ref 65–99)
Glucose-Capillary: 160 mg/dL — ABNORMAL HIGH (ref 65–99)
Glucose-Capillary: 172 mg/dL — ABNORMAL HIGH (ref 65–99)
Glucose-Capillary: 178 mg/dL — ABNORMAL HIGH (ref 65–99)

## 2017-08-19 LAB — HEPATIC FUNCTION PANEL
ALBUMIN: 1.9 g/dL — AB (ref 3.5–5.0)
ALT: 674 U/L — AB (ref 17–63)
AST: 714 U/L — ABNORMAL HIGH (ref 15–41)
Alkaline Phosphatase: 56 U/L (ref 38–126)
BILIRUBIN TOTAL: 4.6 mg/dL — AB (ref 0.3–1.2)
Bilirubin, Direct: 2.8 mg/dL — ABNORMAL HIGH (ref 0.1–0.5)
Indirect Bilirubin: 1.8 mg/dL — ABNORMAL HIGH (ref 0.3–0.9)
Total Protein: 4.5 g/dL — ABNORMAL LOW (ref 6.5–8.1)

## 2017-08-19 LAB — PROTIME-INR
INR: 2.68
INR: 1.54
PROTHROMBIN TIME: 28.3 s — AB (ref 11.4–15.2)
Prothrombin Time: 18.4 seconds — ABNORMAL HIGH (ref 11.4–15.2)

## 2017-08-19 LAB — TYPE AND SCREEN
ABO/RH(D): O POS
Antibody Screen: NEGATIVE

## 2017-08-19 LAB — PREPARE PLATELET PHERESIS: Unit division: 0

## 2017-08-19 LAB — APTT: APTT: 48 s — AB (ref 24–36)

## 2017-08-19 LAB — PREPARE RBC (CROSSMATCH)

## 2017-08-19 LAB — FIBRIN DERIVATIVES D-DIMER (ARMC ONLY): FIBRIN DERIVATIVES D-DIMER (ARMC): 3176.04 — AB (ref 0.00–499.00)

## 2017-08-19 LAB — HEPATITIS C VRS RNA DETECT BY PCR-QUAL: HEPATITIS C VRS RNA BY PCR-QUAL: NEGATIVE

## 2017-08-19 LAB — FIBRINOGEN: FIBRINOGEN: 133 mg/dL — AB (ref 210–475)

## 2017-08-19 MED ORDER — LORAZEPAM 2 MG/ML IJ SOLN
0.5000 mg | INTRAMUSCULAR | Status: DC | PRN
  Administered 2017-08-19 – 2017-08-27 (×4): 1 mg via INTRAVENOUS
  Filled 2017-08-19 (×4): qty 1

## 2017-08-19 MED ORDER — LORAZEPAM 2 MG/ML IJ SOLN
1.0000 mg | Freq: Four times a day (QID) | INTRAMUSCULAR | Status: DC | PRN
Start: 2017-08-19 — End: 2017-08-19
  Administered 2017-08-19: 1 mg via INTRAVENOUS
  Filled 2017-08-19: qty 1

## 2017-08-19 MED ORDER — SODIUM CHLORIDE 0.9 % IV BOLUS (SEPSIS)
500.0000 mL | Freq: Once | INTRAVENOUS | Status: AC
  Administered 2017-08-19: 500 mL via INTRAVENOUS

## 2017-08-19 MED ORDER — DEXMEDETOMIDINE HCL IN NACL 400 MCG/100ML IV SOLN
0.4000 ug/kg/h | INTRAVENOUS | Status: DC
  Administered 2017-08-19: 0.4 ug/kg/h via INTRAVENOUS
  Administered 2017-08-19: 0.5 ug/kg/h via INTRAVENOUS

## 2017-08-19 MED ORDER — HYDRALAZINE HCL 20 MG/ML IJ SOLN
10.0000 mg | INTRAMUSCULAR | Status: DC | PRN
  Administered 2017-08-19 (×2): 10 mg via INTRAVENOUS
  Filled 2017-08-19: qty 1

## 2017-08-19 MED ORDER — DEXMEDETOMIDINE HCL IN NACL 400 MCG/100ML IV SOLN
0.4000 ug/kg/h | INTRAVENOUS | Status: DC
  Administered 2017-08-19: 0.4 ug/kg/h via INTRAVENOUS
  Filled 2017-08-19: qty 100

## 2017-08-19 MED ORDER — POTASSIUM CHLORIDE 10 MEQ/50ML IV SOLN
10.0000 meq | INTRAVENOUS | Status: AC
  Administered 2017-08-19 (×2): 10 meq via INTRAVENOUS
  Filled 2017-08-19 (×2): qty 50

## 2017-08-19 MED ORDER — STERILE WATER FOR INJECTION IJ SOLN
INTRAMUSCULAR | Status: AC
  Administered 2017-08-19: 10 mL
  Filled 2017-08-19: qty 10

## 2017-08-19 MED ORDER — SODIUM CHLORIDE 0.9 % IV SOLN
Freq: Once | INTRAVENOUS | Status: AC
  Administered 2017-08-19: 05:00:00 via INTRAVENOUS

## 2017-08-19 NOTE — Progress Notes (Signed)
Patient ID: Jesus [name], male   DOB: 1951/10/16, 66 y.o.   MRN: [id number]   [facility] PROGRESS NOTE  [facility] TGG:[id number] DOB: 1951-02-26 DOA: 08/16/2017 PCP: System, Pcp Not In  HPI/Subjective: Patient answers a few questions and able to follow some simple commands. Still not a great historian.  Objective: Vitals:   08/19/17 1323 08/19/17 1400  BP: (!) 175/102 (!) 141/85  Pulse:  (!) 117  Resp:  13  Temp:    SpO2:  97%    Filed Weights   08/16/17 0045 08/18/17 0247  Weight: 123.4 kg (272 lb) 129.6 kg (285 lb 11.5 oz)    ROS: Review of Systems  Unable to perform ROS: Acuity of condition  Respiratory: Negative for shortness of breath.   Cardiovascular: Negative for chest pain.  Gastrointestinal: Negative for abdominal pain, nausea and vomiting.   Exam: Physical Exam  Eyes: Pupils are equal, round, and reactive to light. Lids are normal.  icteric  Neck: No JVD present. Carotid bruit is not present. No edema present. No thyroid mass and no thyromegaly present.  Cardiovascular: S1 normal and S2 normal.  Tachycardia present.  Exam reveals no gallop.   Murmur heard.  Systolic murmur is present with a grade of 3/6  Pulses:      Dorsalis pedis pulses are 1+ on the right side, and 1+ on the left side.  Respiratory: No respiratory distress. He has decreased breath sounds in the right lower field and the left lower field. He has no wheezes. He has rhonchi in the right lower field and the left lower field. He has no rales.  Upper airway wheeze  GI: Soft. Bowel sounds are normal. There is no tenderness.  Musculoskeletal:       Right ankle: He exhibits swelling.       Left ankle: He exhibits swelling.  Lymphadenopathy:    He has no cervical adenopathy.  Neurological: He is alert.  Able to lift his right arm up and squeeze bilateral hands. Able to move bilateral legs on his own.  Skin: Skin is warm. Nails show no clubbing.  Chronic lower extremity discoloration on  bilateral shins.  Psychiatric: His speech is delayed.  Patient slow with answers      Data Reviewed: Basic Metabolic Panel:  Recent Labs Lab 08/16/17 0456  08/16/17 1941 08/17/17 0011 08/17/17 0555 08/18/17 0405 08/19/17 0322  NA 143  < > 146* 144 143 146* 150*  K 4.3  < > 3.6 3.5 3.9 3.3* 3.3*  CL 105  < > 111 110 112* 115* 119*  CO2 11*  < > 27 25 23 26 26   GLUCOSE 649*  < > 130* 178* 228* 264* 201*  BUN 89*  < > 96* 97* 96* 95* 81*  CREATININE 2.29*  < > 2.37* 2.33* 2.30* 2.01* 1.58*  CALCIUM 8.7*  < > 8.0* 7.8* 7.6* 7.4* 7.0*  MG 2.0  --   --  1.4* 1.3* 2.2  --   PHOS 7.8*  --   --   --  2.6  --   --   < > = values in this interval not displayed. Liver Function Tests:  Recent Labs Lab 08/16/17 0049 08/16/17 0456 08/18/17 0405 08/19/17 0322  AST 122* 618* 582* 714*  ALT 77* 392* 596* 674*  ALKPHOS 67 109 68 56  BILITOT 1.2 1.3* 3.5* 4.6*  PROT 5.0* 4.9* 4.8* 4.5*  ALBUMIN 2.3* 2.2* 2.3* 1.9*    Recent Labs Lab 08/16/17  0456  LIPASE 33  AMYLASE 145*    Recent Labs Lab 08/16/17 0051 08/16/17 1058 08/17/17 2129  AMMONIA 177* 325* 78*   CBC:  Recent Labs Lab 08/16/17 0049  08/17/17 0555 08/17/17 1629 08/18/17 0405 08/19/17 0322 08/19/17 1257  WBC 19.0*  < > 10.0 7.9 7.8 4.0 6.8  NEUTROABS 15.9*  --   --   --  5.2 2.5  --   HGB 5.8*  < > 7.3* 7.5* 7.4* 4.5* 9.8*  HCT 20.5*  < > 21.3* 22.6* 22.2* 13.8* 29.2*  MCV 99.6  < > 85.7 85.9 86.2 88.8 85.8  PLT 174  < > 41* 33* 43* 30* 46*  < > = values in this interval not displayed. Cardiac Enzymes:  Recent Labs Lab 08/16/17 0049 08/16/17 0456 08/16/17 0950 08/16/17 1745 08/17/17 1449  CKTOTAL 171  --   --   --   --   TROPONINI 0.06* 0.45* 6.53* 16.81* 5.41*    CBG:  Recent Labs Lab 08/18/17 2009 08/19/17 0005 08/19/17 0352 08/19/17 0720 08/19/17 1131  GLUCAP 186* 178* 160* 172* 139*    Recent Results (from the past 240 hour(s))  Blood Culture (routine x 2)     Status: None  (Preliminary result)   Collection Time: 08/16/17 12:50 AM  Result Value Ref Range Status   Specimen Description BLOOD LEFT ARM  Final   Special Requests   Final    BOTTLES DRAWN AEROBIC AND ANAEROBIC Blood Culture adequate volume   Culture NO GROWTH 3 DAYS  Final   Report Status PENDING  Incomplete  Blood Culture (routine x 2)     Status: None (Preliminary result)   Collection Time: 08/16/17 12:51 AM  Result Value Ref Range Status   Specimen Description BLOOD LEFT ANTECUBITAL  Final   Special Requests   Final    BOTTLES DRAWN AEROBIC AND ANAEROBIC Blood Culture adequate volume   Culture NO GROWTH 3 DAYS  Final   Report Status PENDING  Incomplete  Urine culture     Status: Abnormal   Collection Time: 08/16/17 12:51 AM  Result Value Ref Range Status   Specimen Description URINE, RANDOM  Final   Special Requests NONE  Final   Culture MULTIPLE SPECIES PRESENT, SUGGEST RECOLLECTION (A)  Final   Report Status 08/17/2017 FINAL  Final  MRSA PCR Screening     Status: None   Collection Time: 08/16/17  4:56 AM  Result Value Ref Range Status   MRSA by PCR NEGATIVE NEGATIVE Final    Comment:        The GeneXpert MRSA Assay (FDA approved for NASAL specimens only), is one component of a comprehensive MRSA colonization surveillance program. It is not intended to diagnose MRSA infection nor to guide or monitor treatment for MRSA infections.      Studies: Dg Chest Port 1 View  Result Date: 08/19/2017 CLINICAL DATA:  Respiratory failure EXAM: PORTABLE CHEST 1 VIEW COMPARISON:  08/18/2017 FINDINGS: Cardiomegaly with vascular congestion. Small right pleural effusion and right base atelectasis. No overt edema. No acute bony abnormality. IMPRESSION: Cardiomegaly with vascular congestion.  Improving edema pattern. Small right effusion with right base atelectasis. Electronically Signed   By: Rolm Baptise M.D.   On: 08/19/2017 08:20   Dg Chest Port 1 View  Result Date: 08/18/2017 CLINICAL DATA:   66 year old male with respiratory failure. Subsequent encounter. EXAM: PORTABLE CHEST 1 VIEW COMPARISON:  08/17/2017 FINDINGS: Endotracheal tube tip 2.3 cm above the carina. Nasogastric tube is seen to  level of the distal esophagus. Beyond this region, evaluation limited by technique. Cardiomegaly.  Prominent mediastinum unchanged. Fairly symmetric airspace disease unchanged suggestive of pulmonary edema. IMPRESSION: Similar appearance of airspace disease suggestive of pulmonary edema. Cardiomegaly. Prominent mediastinum unchanged. Endotracheal tube tip 2.3 cm above the carina. Full extent of the nasogastric tube not visualized secondary to technique. Electronically Signed   By: Genia Del M.D.   On: 08/18/2017 06:27   Dg Chest Port 1 View  Result Date: 08/17/2017 CLINICAL DATA:  Endotracheal tube placement EXAM: PORTABLE CHEST 1 VIEW COMPARISON:  08/16/2017 FINDINGS: Endotracheal tube tip measures 4.6 cm above the carina. Enteric tube tip is off the field of view and is not visualized but is below the left hemidiaphragm. Shallow inspiration. Cardiac enlargement with pulmonary vascular congestion. Developing perihilar infiltration since previous study suggesting developing edema. Small right pleural effusion. No pneumothorax. IMPRESSION: Appliances appear in satisfactory position. Cardiac enlargement with developing perihilar edema and small right pleural effusion. Electronically Signed   By: Lucienne Capers M.D.   On: 08/17/2017 21:26   US Liver Doppler  Result Date: 08/17/2017 CLINICAL DATA:  Elevated LFTs EXAM: DUPLEX ULTRASOUND OF LIVER TECHNIQUE: Color and duplex Doppler ultrasound was performed to evaluate the hepatic in-flow and out-flow vessels. COMPARISON:  08/17/2017 FINDINGS: Portal Vein Velocities Main:  27 cm/sec Right:  14 cm/sec Left:  13 cm/sec Hepatic Vein Velocities Right:  24 cm/sec Middle:  44 cm/sec Left:  50 cm/sec Hepatic Artery Velocity:  186 cm/sec Splenic Vein Velocity:  13  cm/sec Varices: None visualized Ascites: Small amount of abdominal ascites Limited exam because of patient's medical condition and being in the ICU intubated. Portal, hepatic and splenic veins appear grossly patent with normal directional flow. Spleen is enlarged measuring 15.3 x 15.3 x 7.7 cm consistent with a volume of 947 cc Negative for portal or splenic vein occlusion. Small amount of upper abdominal ascites. IMPRESSION: Limited but grossly normal hepatic Doppler. Splenomegaly Small amount of ascites Electronically Signed   By: Jerilynn Mages.  Shick M.D.   On: 08/17/2017 15:56   US Abdomen Limited Ruq  Result Date: 08/17/2017 CLINICAL DATA:  Abnormal LFTs. EXAM: ULTRASOUND ABDOMEN LIMITED RIGHT UPPER QUADRANT TECHNIQUE: Standard sonographic evaluation the right upper abdominal quadrant, followed by the acquisition of color and duplex Doppler ultrasound was performed to evaluate the hepatic in-flow and out-flow vessels. COMPARISON:  CT abdomen pelvis -08/12/2010 FINDINGS: Gallbladder: Several echogenic gallstones a minimal amount of mixed echogenic suspected biliary sludge is seen within the gallbladder. Note is made of a small amount of pericholecystic fluid, a nonspecific finding in the setting of intra-abdominal ascites. No definitive gallbladder wall thickening. Common bile duct: Diameter: Normal in size measuring 4 mm in diameter Liver: There is diffuse increased slightly coarsened echogenicity of the hepatic parenchyma. Note is made of mild nodularity of the hepatic contour. No discrete hepatic lesions though note sonographic evaluation degraded secondary to patient's intubated state. Portal Vein Velocities Main:  17.8 cm/sec Right:  13.7 cm/sec Left:  13.4 cm/sec Hepatic Vein Velocities Right:  23.9 cm/sec Middle:  44.1 cm/sec Left:  50.5 cm/sec Hepatic Artery Velocity:  186.3 cm/sec Splenic Vein Velocity:  13.2 cm/sec Varices: None identified Ascites: Small amount of perihepatic and intra-abdominal ascites. The  spleen is enlarged measuring 15.3 x 15.3 x 7.7 cm with calculated volume of 947.5 cm cubed). IMPRESSION: 1. Findings worrisome for hepatic cirrhosis and portal venous hypertension including nodular hepatic contour, splenomegaly and small volume intra-abdominal ascites. 2. Hepatic vascular system appears widely  patent with normal directional flow. Electronically Signed   By: Sandi Mariscal M.D.   On: 08/17/2017 16:06    Scheduled Meds: . chlorhexidine gluconate (MEDLINE KIT)  15 mL Mouth Rinse BID  . insulin aspart  0-20 Units Subcutaneous Q4H  . insulin glargine  10 Units Subcutaneous QHS  . mouth rinse  15 mL Mouth Rinse 10 times per day  . pantoprazole (PROTONIX) IV  40 mg Intravenous Q12H  . sodium chloride flush  10-40 mL Intracatheter Q12H   Continuous Infusions: . dexmedetomidine (PRECEDEX) IV infusion 0.4 mcg/kg/hr (08/19/17 1444)  . dextrose 5 % with kcl 100 mL/hr at 08/19/17 0851  . meropenem (MERREM) IV Stopped (08/19/17 1309)  . octreotide  (SANDOSTATIN)    IV infusion 50 mcg/hr (08/19/17 0700)    Assessment/Plan:  1. Cardiac arrest. Likely secondary to respiratory arrest and/or GI bleed. 2. Hemorrhagic shock versus septic shock. Patient off all pressors at this point. Empiric antibiotics with meropenem. Cultures are negative. Endoscopy showed angiectasias that were cauterized. Continue octreotide drip and Protonix. 3. Acute hypoxic respiratory failure. Extubated 08/18/2017. Continue nasal cannula oxygen. 4. Metabolic acidosis and lactic acidosis secondary to respiratory failure, shock. 5. Severe anemia. Continue serial hemoglobins and transfuse as necessary.  Today's hemoglobin 4.5 and transfuse 2 units and it came up to 9.8. 6. Acute kidney injury. Creatinine trending better and last creatinine 1.58. 7. Hypernatremia and patient started on D5W 8. Hypokalemia replace potassium in IV fluids 9. Elevated liver function tests likely secondary to cardiac arrest. 10. Liver cirrhosis  seen on ultrasound 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  Patient very slow with his answers.  Could also be hepatic encephalopathy. Consideration for restarting Xifaxan and lactulose. 12. Empiric Vitamin K given for high INR. This likely won't improve if it secondary to the liver. 13. Elevated troponin of 16.81 likely demand ischemia secondary to shock. Limited with a GI bleed and unable to give any anticoagulation. Also Limited with hypotension.  Code Status:     Code Status Orders        Start     Ordered   08/16/17 0405  Full code  Continuous     08/16/17 0404    Code Status History    Date Active Date Inactive Code Status Order ID Comments User Context   05/28/2017  5:15 AM 06/04/2017  8:10 PM Full Code [id number]  Harrie Foreman, MD Inpatient     Family Communication: as per critical care specialist Disposition Plan: patient is critically ill  Consultants:  Critical care specialist  gastroenterology  Procedures:  Upper endoscopy  Antibiotics:  meropenem  Time spent: 24 minutes  Loletha Grayer  [employer]

## 2017-08-19 NOTE — Progress Notes (Signed)
PULMONARY / CRITICAL CARE MEDICINE   Name: Jesus [name] MRN: [id number] DOB: [birth year]    ADMISSION DATE:  08/16/2017   PT PROFILE: 69 M with hx of cirrhosis and chronic iron def anemia adm via ED 09/16 when he presented hematemesis then suffered PEA arrest in ED and 40 mins ACLS.   MAJOR EVENTS/TEST RESULTS: 09/16 adm with above history. Intubated during ACLS. After ROSC, required vasopressors.  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 Treated with argon plasma coagulation. Bleeding grade II esophageal varices. Incompletely eradicated. Banded. 09/17 Cardiac markers elevated (trop I 16.81) 09/17 RUQ US/Liver Doppler: Findings worrisome for hepatic cirrhosis and portal venous hypertension including nodular hepatic contour, splenomegaly and small volume intra-abdominal ascites. Hepatic vascular system appears widely patent with normal directional flow. Limited but grossly normal hepatic Doppler. Splenomegaly 09/18 Passed SBT and extubated. Off vasopressors 09/18 Echocardiogram: EF 55%-60% 09/19 Received 2 units PRBCs for reported Hgb 4.5 - Probably a factitious value. After transfusion, Hgb 9.8 09/19 Palliative Care met with family. DNR/DNI established  INDWELLING DEVICES:: ETT 09/16 >> 09/18 L femoral CVL 09/16 >>  R femoral A-line 09/16 >>09/19   MICRO DATA: MRSA PCR 09/16 >>negative  Urine 09/16 >>multiple species Blood 09/16 >>negative    ANTIMICROBIALS:  Vanc  09/16 >>09/17 Metronidazole 09/16 >>09/17 Meropenem 09/16 >>   PCT  09/16: 0.78, 3.93, 4.89, 3.33  SUBJECTIVE:  Pt awake confused to situation   VITAL SIGNS: BP 138/85   Pulse (!) 105   Temp 98.4 F (36.9 C) (Axillary)   Resp 16   Wt 129.6 kg (285 lb 11.5 oz)   SpO2 100%   BMI 43.44 kg/m   HEMODYNAMICS:    VENTILATOR SETTINGS:    INTAKE / OUTPUT: I/O last 3 completed shifts: In: 3456.9 [I.V.:2516.9; Blood:270; Other:60;  IV Piggyback:610] Out: 9326 [Urine:4740; Emesis/NG output:50]  PHYSICAL EXAMINATION: General: acutely ill appearing male, NAD  Neuro: alert to self and placed confused to situation, follows commands, PERRL HEENT: NCAT, + scericterus Cardiovascular: sinus tach, + syst M of aortic origin  Lungs: rhonchi throughout shallow respirations, even, non labored  Abdomen: Mildly distended, soft, hypoactive BS x4, non tender  Extremities: Cool, diminished pulses, 2+ generalized edema   LABS:  BMET  Recent Labs Lab 08/17/17 0555 08/18/17 0405 08/19/17 0322  NA 143 146* 150*  K 3.9 3.3* 3.3*  CL 112* 115* 119*  CO2 23 26 26   BUN 96* 95* 81*  CREATININE 2.30* 2.01* 1.58*  GLUCOSE 228* 264* 201*    Electrolytes  Recent Labs Lab 08/16/17 0456  08/17/17 0011 08/17/17 0555 08/18/17 0405 08/19/17 0322  CALCIUM 8.7*  < > 7.8* 7.6* 7.4* 7.0*  MG 2.0  --  1.4* 1.3* 2.2  --   PHOS 7.8*  --   --  2.6  --   --   < > = values in this interval not displayed.  CBC  Recent Labs Lab 08/17/17 1629 08/18/17 0405 08/19/17 0322  WBC 7.9 7.8 4.0  HGB 7.5* 7.4* 4.5*  HCT 22.6* 22.2* 13.8*  PLT 33* 43* 30*    Coag's  Recent Labs Lab 08/16/17 0950  08/17/17 1629 08/18/17 0405 08/19/17 0322  APTT 43*  --   --  37* 48*  INR 2.84  < > 2.08 1.91 2.68  < > = values in this interval not displayed.  Sepsis Markers  Recent Labs Lab 08/16/17 0051  08/16/17 0422 08/17/17  3893 08/18/17 0405 08/19/17 0322  LATICACIDVEN 21.9*  --  18.6* 4.1*  --   --   PROCALCITON  --   < > 0.78 3.93 4.89 3.33  < > = values in this interval not displayed.  ABG  Recent Labs Lab 08/16/17 0630 08/16/17 1417 08/17/17 0500  PHART 7.10* 7.41 7.37  PCO2ART 41 40 37  PO2ART 105 94 75*    Liver Enzymes  Recent Labs Lab 08/16/17 0456 08/18/17 0405 08/19/17 0322  AST 618* 582* 714*  ALT 392* 596* 674*  ALKPHOS 109 68 56  BILITOT 1.3* 3.5* 4.6*  ALBUMIN 2.2* 2.3* 1.9*    Cardiac  Enzymes  Recent Labs Lab 08/16/17 0950 08/16/17 1745 08/17/17 1449  TROPONINI 6.53* 16.81* 5.41*    Glucose  Recent Labs Lab 08/18/17 1111 08/18/17 1542 08/18/17 2009 08/19/17 0005 08/19/17 0352 08/19/17 0720  GLUCAP 173* 193* 186* 178* 160* 172*    CXR: cardiomegaly, edema pattern   ASSESSMENT / PLAN:  PULMONARY A: VDRF after cardiac arrest Pulmonary edema P:   Supplmental oxygen to keep SPO2 > 90% Intermittent CXR   CARDIOVASCULAR A:  Hemorrhagic/hypovolemic shock - resolved Possible component of cardiogenic shock - resolved Elevated troponin I - likely supply/demand imbalance P:  MAP goal >65 mmHg Consider cardiology consultation prior to discharge  RENAL A:   AKI, nonoliguric Hypernatremia Hypokalemia P:   Monitor BMET intermittently Monitor I/Os Correct electrolytes as indicated  Increased IV fluids   GASTROINTESTINAL A:   Cirrhosis with elevated LFTs UGIB - gastric angioectasias and esophageal varices  No evidence of active bleeding currently Post extubation dysphagia P:   Will hold off on NG tube placement for now if unable to tolerate po nutrition will discuss with Gastroenterology  Continue IV protonix Continue octreotide infusion NPO for now SLP consultation requested for 09/19  HEMATOLOGIC A:   Acute blood loss anemia Coagulopathy due to liver failure Thrombocytopenia P:  Repeat CBC and Coags today @noon  today will transfuse if hgb <7 Prn Vitamin K SQ  DIC panel results pending   INFECTIOUS A:   Elevated PCT, no obvious infection High risk SBP P:   Monitor temp, WBC count Micro and abx as above Recheck PCT 09/19  ENDOCRINE A:   DM 2 Severe hyperglycemia - improved P:   Continue SSI, resistant scale Continue lantus   NEUROLOGIC A:   Acute encephalopathy may be metabolic vs. anoxic injury ICU/ventilator associated discomfort P:   RASS goal: 0 Avoid sedating medication Frequent reorientation   FAMILY:  Palliative care meeting scheduled for today 08/19/17 at 13:00 to discuss goals of treatment.  Marda Stalker, [place] Pager [phone] (please enter 7 digits) PCCM Consult Pager [phone] (please enter 7 digits)   PCCM ATTENDING ATTESTATION:  I have evaluated patient with the APP [place], reviewed database in its entirety and discussed care plan in detail. In addition, this patient was discussed on multidisciplinary rounds.   Important exam findings: No respiratory distress Poorly oriented and occasionally agitated No wheezes Regular, no M Obese, soft, + BS Symmetric LE edema No focal neurologic deficits  CXR: NNF  PLAN/REC: The above assessment and plan has been reviewed by me and I agree with it as documented. Continue to watch in SDU at least through today.    Merton Border, MD PCCM service Mobile [phone] Pager [phone] 08/19/2017 2:43 PM

## 2017-08-19 NOTE — Progress Notes (Signed)
Inpatient Diabetes Program Recommendations  AACE/ADA: New Consensus Statement on Inpatient Glycemic Control (2015)  Target Ranges:  Prepandial:   less than 140 mg/dL      Peak postprandial:   less than 180 mg/dL (1-2 hours)      Critically ill patients:  140 - 180 mg/dL   Lab Results  Component Value Date   GLUCAP 172 (H) 08/19/2017   HGBA1C 9.9 (H) 05/28/2017    Review of Glycemic ControlResults for ALFIO, LOESCHER (MRN [id number]) as of 08/19/2017 10:32  Ref. Range 08/18/2017 15:42 08/18/2017 20:09 08/19/2017 00:05 08/19/2017 03:52 08/19/2017 07:20  Glucose-Capillary Latest Ref Range: 65 - 99 mg/dL 595 (H) 638 (H) 756 (H) 160 (H) 172 (H)   Diabetes history:DM2 Outpatient Diabetes medications: Lantus 34 units daily, Novolog 7 units TID with meals, Metformin 1000 mg BID, Glipizide 5 mg daily Current orders for Inpatient glycemic control: Novolog 0-20 units Q4H  Inpatient Diabetes Program Recommendations: Note that patient extubated on 08/18/17-  Blood sugars well controlled on current regimen.  No recommendations.   Thanks, Beryl Meager, RN, BC-ADM Inpatient Diabetes Coordinator Pager [phone] (8a-5p)

## 2017-08-19 NOTE — Progress Notes (Signed)
PT Cancellation Note  Patient Details Name: Jesus [name] MRN: [id number] DOB: November 20, 1951   Cancelled Treatment:    Reason Eval/Treat Not Completed: Other (comment).  PT consult received.  Chart reviewed.  Pt resting in bed upon PT arrival (pt on low dose Precedex drip).  Pt occasionally following commands but limited participation d/t drowsiness and difficulty consistently following simple 1 step commands.  Pt currently unable to meaningfully participate in physical therapy.  Will re-attempt PT eval at a later date/time (nursing notified).  Hendricks Limes, PT 08/19/17, 4:38 PM [phone]

## 2017-08-19 NOTE — Progress Notes (Signed)
Patient awake alert and able to follow simple commands throughout the night, was combative requiring mitts and precedex for safety and rest. Patient ST while awake, levophed started for BP with MAP < than 65. UOP WDL, HGB 4.5 with orders to transfuse 2 units. Unit #1 currently infusing See CHL for further assessment. Will continue to assess for changes/need.

## 2017-08-19 NOTE — Evaluation (Signed)
Clinical/Bedside Swallow Evaluation Patient Details  Name: Jesus [name] MRN: [id number] Date of Birth: 12/07/1950  Today's Date: 08/19/2017 Time: SLP Start Time (ACUTE ONLY): 0930 SLP Stop Time (ACUTE ONLY): 1030 SLP Time Calculation (min) (ACUTE ONLY): 60 min  Past Medical History:  Past Medical History:  Diagnosis Date  . Cirrhosis of liver (HCC)   . Diabetes [facility])    Past Surgical History:  Past Surgical History:  Procedure Laterality Date  . ESOPHAGOGASTRODUODENOSCOPY (EGD) WITH PROPOFOL N/A 08/17/2017   Procedure: ESOPHAGOGASTRODUODENOSCOPY (EGD) WITH PROPOFOL;  Surgeon: Jesus Mood, MD;  Location: [facility] ENDOSCOPY;  Service: Gastroenterology;  Laterality: N/A;  . ROTATOR CUFF REPAIR Right   . TEE WITHOUT CARDIOVERSION N/A 06/04/2017   Procedure: TRANSESOPHAGEAL ECHOCARDIOGRAM (TEE);  Surgeon: Jesus Iba, MD;  Location: [facility] ORS;  Service: Cardiovascular;  Laterality: N/A;  . TOTAL KNEE ARTHROPLASTY Bilateral    HPI:  [name] admitted to the ICU with PEA arrest x 2 , acute on chronic anemia with prior evaluation at the [place] with no source of blood loss seen. H/o liver cirrhosis secondary to NASH. On this admission noted to have a possible shocked liver, on pressors, vominting blood, received blood. 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 Pt was emergently intubated at admission, extubated 08/18/17.   Assessment / Plan / Recommendation Clinical Impression  Pt appears to present w/ moderate-severe oropharyngeal phase dysphagia and would be at increased risk for aspiration w/ oral intake at this time. Pt presented w/ decreased/poor awareness for task of swallowing and oral intake in general. Pt exhibited discoordination of his breathing at rest c/b breathing holding and grunting; this continued during po trials. Pt was fidgity and moved about in the bed leaning to one side despite verbal/tactile cues to not do so in order to remain  upright for eating/drinking. When pt consumed po trials of ice chips and thin liquids, pt exhibited delayed and immediate coughing though no decline in O2 sats or increased in RR; noted intermittent belching. As pt does not appear to exhibit adequate Cognitive awareness for safe management and swallowing of oral intake, recommend remaining NPO w/ frequent oral care for hygiene and stimulation of swallowing. ST services will continue to f/u w/ ongoing assessment of swallowing to determine appropriateness for oral diet. MD/NSG and Dietician updated.   SLP Visit Diagnosis: Dysphagia, oropharyngeal phase (R13.12)    Aspiration Risk  Moderate aspiration risk;Severe aspiration risk    Diet Recommendation  NPO status; frequrent oral care for hygiene and oral stimulation for swallowing  Medication Administration:  (TBD)    Other  Recommendations Recommended Consults: Consider GI evaluation (following; Dietician f/u) Oral Care Recommendations: Oral care QID;Staff/trained caregiver to provide oral care Other Recommendations:  (TBD)   Follow up Recommendations Skilled Nursing facility      Frequency and Duration min 3x week  2 weeks       Prognosis Prognosis for Safe Diet Advancement: Guarded Barriers to Reach Goals: Cognitive deficits;Severity of deficits;Behavior      Swallow Study   General Date of Onset: 08/16/17 HPI: [name] admitted to the ICU with PEA arrest x 2 , acute on chronic anemia with prior evaluation at the [place] with no source of blood loss seen. H/o liver cirrhosis secondary to NASH. On this admission noted to have a possible shocked liver, on pressors, vominting blood, received blood. S/p EGD and banding of esophageal varices, APC of gastric AVM's which were  bleeding. Pt was emergently intubated at admission, extubated 08/18/17. Type of Study: Bedside Swallow Evaluation Previous Swallow Assessment: none noted Diet Prior to this Study: Regular;Thin liquids (at home; NPO w/  fluids currently) Temperature Spikes Noted: No (wbc 4.0) Respiratory Status: Nasal cannula (2 liters) History of Recent Intubation: Yes Length of Intubations (days): 3 days Date extubated: 08/18/17 Behavior/Cognition: Cooperative;Confused;Agitated;Impulsive;Distractible;Requires cueing;Doesn't follow directions Oral Cavity Assessment: Dry (sticky) Oral Care Completed by SLP: Yes (and by NSG) Oral Cavity - Dentition: Adequate natural dentition Vision:  (n/a) Self-Feeding Abilities: Total assist Patient Positioning: Postural control adequate for testing (tended to move about in the bed) Baseline Vocal Quality: Low vocal intensity (mumbled, grunting w/ breath holding at times) Volitional Cough: Cognitively unable to elicit Volitional Swallow: Unable to elicit    Oral/Motor/Sensory Function Overall Oral Motor/Sensory Function:  (w/ bolus management it appeared wfl)   Ice Chips Ice chips: Impaired Presentation: Spoon (fed; 5 trials) Oral Phase Impairments: Poor awareness of bolus;Reduced labial seal (seemed to forget the bolus task after time) Oral Phase Functional Implications: Prolonged oral transit Pharyngeal Phase Impairments: Cough - Delayed (x1/5)   Thin Liquid Thin Liquid: Impaired Presentation: Straw (fed; monitored sips by pinching straw - 3 trials) Oral Phase Impairments: Poor awareness of bolus;Reduced labial seal (reduced as tasks continued) Oral Phase Functional Implications:  (none) Pharyngeal  Phase Impairments: Cough - Immediate;Cough - Delayed (x3/3) Other Comments: pt appeared to present w/ poor coordination w/ maintaining easy, smooth breathing during bolus intake and bolus management prior to the swallow - noted frequent breath holding and grunting as prior to the session    Nectar Thick Nectar Thick Liquid: Not tested   Honey Thick Honey Thick Liquid: Not tested   Puree Puree: Not tested   Solid   GO   Solid: Not tested         Jesus Som, MS,  CCC-SLP Jesus [name] 08/19/2017,11:32 AM

## 2017-08-19 NOTE — Progress Notes (Signed)
Jonathon Bellows MD, MRCP(U.K) [address]  [place]  [place], [place] [address]  Main: [phone]    RONDA KAZMI is being followed for variceal bleed   Subjective: No complaints from patient    Objective: Vital signs in last 24 hours: Vitals:   08/19/17 0500 08/19/17 0514 08/19/17 0530 08/19/17 0600  BP: (!) 142/98   (!) 105/52  Pulse: 79   72  Resp: 20   10  Temp:  98.6 F (37 C) 98.6 F (37 C)   TempSrc:  Axillary Axillary   SpO2: 100%   100%  Weight:       Weight change:   Intake/Output Summary (Last 24 hours) at 08/19/17 0725 Last data filed at 08/19/17 4315  Gross per 24 hour  Intake          2274.64 ml  Output             3615 ml  Net         -1340.36 ml     Exam: Heart:: Regular rate and rhythm, S1S2 present or without murmur or extra heart sounds Lungs: normal, clear to auscultation and clear to auscultation and percussion Abdomen: soft, nontender, normal bowel sounds   Lab Results: @LABTEST2 @ Micro Results: Recent Results (from the past 240 hour(s))  Blood Culture (routine x 2)     Status: None (Preliminary result)   Collection Time: 08/16/17 12:50 AM  Result Value Ref Range Status   Specimen Description BLOOD LEFT ARM  Final   Special Requests   Final    BOTTLES DRAWN AEROBIC AND ANAEROBIC Blood Culture adequate volume   Culture NO GROWTH 2 DAYS  Final   Report Status PENDING  Incomplete  Blood Culture (routine x 2)     Status: None (Preliminary result)   Collection Time: 08/16/17 12:51 AM  Result Value Ref Range Status   Specimen Description BLOOD LEFT ANTECUBITAL  Final   Special Requests   Final    BOTTLES DRAWN AEROBIC AND ANAEROBIC Blood Culture adequate volume   Culture NO GROWTH 2 DAYS  Final   Report Status PENDING  Incomplete  Urine culture     Status: Abnormal   Collection Time: 08/16/17 12:51 AM  Result Value Ref Range Status   Specimen Description URINE, RANDOM  Final   Special Requests NONE  Final   Culture MULTIPLE  SPECIES PRESENT, SUGGEST RECOLLECTION (A)  Final   Report Status 08/17/2017 FINAL  Final  MRSA PCR Screening     Status: None   Collection Time: 08/16/17  4:56 AM  Result Value Ref Range Status   MRSA by PCR NEGATIVE NEGATIVE Final    Comment:        The GeneXpert MRSA Assay (FDA approved for NASAL specimens only), is one component of a comprehensive MRSA colonization surveillance program. It is not intended to diagnose MRSA infection nor to guide or monitor treatment for MRSA infections.    Studies/Results: Dg Chest Port 1 View  Result Date: 08/18/2017 CLINICAL DATA:  66 year old male with respiratory failure. Subsequent encounter. EXAM: PORTABLE CHEST 1 VIEW COMPARISON:  08/17/2017 FINDINGS: Endotracheal tube tip 2.3 cm above the carina. Nasogastric tube is seen to level of the distal esophagus. Beyond this region, evaluation limited by technique. Cardiomegaly.  Prominent mediastinum unchanged. Fairly symmetric airspace disease unchanged suggestive of pulmonary edema. IMPRESSION: Similar appearance of airspace disease suggestive of pulmonary edema. Cardiomegaly. Prominent mediastinum unchanged. Endotracheal tube tip 2.3 cm above the carina. Full extent of  the nasogastric tube not visualized secondary to technique. Electronically Signed   By: Genia Del M.D.   On: 08/18/2017 06:27   Dg Chest Port 1 View  Result Date: 08/17/2017 CLINICAL DATA:  Endotracheal tube placement EXAM: PORTABLE CHEST 1 VIEW COMPARISON:  08/16/2017 FINDINGS: Endotracheal tube tip measures 4.6 cm above the carina. Enteric tube tip is off the field of view and is not visualized but is below the left hemidiaphragm. Shallow inspiration. Cardiac enlargement with pulmonary vascular congestion. Developing perihilar infiltration since previous study suggesting developing edema. Small right pleural effusion. No pneumothorax. IMPRESSION: Appliances appear in satisfactory position. Cardiac enlargement with developing  perihilar edema and small right pleural effusion. Electronically Signed   By: Lucienne Capers M.D.   On: 08/17/2017 21:26   US Liver Doppler  Result Date: 08/17/2017 CLINICAL DATA:  Elevated LFTs EXAM: DUPLEX ULTRASOUND OF LIVER TECHNIQUE: Color and duplex Doppler ultrasound was performed to evaluate the hepatic in-flow and out-flow vessels. COMPARISON:  08/17/2017 FINDINGS: Portal Vein Velocities Main:  27 cm/sec Right:  14 cm/sec Left:  13 cm/sec Hepatic Vein Velocities Right:  24 cm/sec Middle:  44 cm/sec Left:  50 cm/sec Hepatic Artery Velocity:  186 cm/sec Splenic Vein Velocity:  13 cm/sec Varices: None visualized Ascites: Small amount of abdominal ascites Limited exam because of patient's medical condition and being in the ICU intubated. Portal, hepatic and splenic veins appear grossly patent with normal directional flow. Spleen is enlarged measuring 15.3 x 15.3 x 7.7 cm consistent with a volume of 947 cc Negative for portal or splenic vein occlusion. Small amount of upper abdominal ascites. IMPRESSION: Limited but grossly normal hepatic Doppler. Splenomegaly Small amount of ascites Electronically Signed   By: Jerilynn Mages.  Shick M.D.   On: 08/17/2017 15:56   US Abdomen Limited Ruq  Result Date: 08/17/2017 CLINICAL DATA:  Abnormal LFTs. EXAM: ULTRASOUND ABDOMEN LIMITED RIGHT UPPER QUADRANT TECHNIQUE: Standard sonographic evaluation the right upper abdominal quadrant, followed by the acquisition of color and duplex Doppler ultrasound was performed to evaluate the hepatic in-flow and out-flow vessels. COMPARISON:  CT abdomen pelvis -08/12/2010 FINDINGS: Gallbladder: Several echogenic gallstones a minimal amount of mixed echogenic suspected biliary sludge is seen within the gallbladder. Note is made of a small amount of pericholecystic fluid, a nonspecific finding in the setting of intra-abdominal ascites. No definitive gallbladder wall thickening. Common bile duct: Diameter: Normal in size measuring 4 mm in  diameter Liver: There is diffuse increased slightly coarsened echogenicity of the hepatic parenchyma. Note is made of mild nodularity of the hepatic contour. No discrete hepatic lesions though note sonographic evaluation degraded secondary to patient's intubated state. Portal Vein Velocities Main:  17.8 cm/sec Right:  13.7 cm/sec Left:  13.4 cm/sec Hepatic Vein Velocities Right:  23.9 cm/sec Middle:  44.1 cm/sec Left:  50.5 cm/sec Hepatic Artery Velocity:  186.3 cm/sec Splenic Vein Velocity:  13.2 cm/sec Varices: None identified Ascites: Small amount of perihepatic and intra-abdominal ascites. The spleen is enlarged measuring 15.3 x 15.3 x 7.7 cm with calculated volume of 947.5 cm cubed). IMPRESSION: 1. Findings worrisome for hepatic cirrhosis and portal venous hypertension including nodular hepatic contour, splenomegaly and small volume intra-abdominal ascites. 2. Hepatic vascular system appears widely patent with normal directional flow. Electronically Signed   By: Sandi Mariscal M.D.   On: 08/17/2017 16:06   Medications: I have reviewed the patient's current medications. Scheduled Meds: . chlorhexidine gluconate (MEDLINE KIT)  15 mL Mouth Rinse BID  . insulin aspart  0-20 Units Subcutaneous Q4H  .  insulin glargine  10 Units Subcutaneous QHS  . mouth rinse  15 mL Mouth Rinse 10 times per day  . pantoprazole (PROTONIX) IV  40 mg Intravenous Q12H  . phytonadione  10 mg Subcutaneous Daily  . sodium chloride flush  10-40 mL Intracatheter Q12H   Continuous Infusions: . dextrose 5 % with kcl 50 mL/hr at 08/19/17 0529  . meropenem (MERREM) IV Stopped (08/18/17 2200)  . norepinephrine (LEVOPHED) 37m / 2542minfusion 7 mcg/min (08/19/17 065176 . octreotide  (SANDOSTATIN)    IV infusion 50 mcg/hr (08/19/17 0117)   PRN Meds:.bisacodyl, LORazepam, [DISCONTINUED] ondansetron **OR** ondansetron (ZOFRAN) IV, sodium chloride flush   Assessment: Active Problems:   [place] ([place]  Hemorrhagic shock  (HCC)   AKI (acute kidney injury) ([place]  Hepatic cirrhosis (HCC)   [facility]  Hematemesis   Palliative care encounter   DNR (do not resuscitate) discussion  BrVera Wishartouse65 y.o.maleadmitted to the ICU with PEA arrest x 2 , acute on [facility]. H/o liver cirrhosis . On this admission noted to have a possible shocked liver, on pressors , received blood. 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 Early this morning drop in Hb and hypotension   1. Variceal bleed- stable, continue antibiotics for 5 days, Octreotide for a total of 72 hours after banding. Early this morning Hb dropped, no overt bleeding ?? Intra abdominal bleed vs ?? DIC .  Repeat EGD as outpatient in 2-3 weeks for eradication of all varices. Commence on nadolol 20 mg closer to discharge  . No role for repeat EGD- if concern for variceal bleeding will need TIPS. If Hb continues to drop consider Tagged RBC scan   2. Acute liver failure- likely from shocked liver , USG doppler shows no thrombosis.INR was improving but today is worse. Continue supportive care   3. Palliative care is following- overall poor prognosis.     LOS: 3 days   KiJonathon Bellows/19/2018, 7:25 AM

## 2017-08-19 NOTE — Progress Notes (Signed)
Daily Progress Note   Patient Name: Jesus [name]       Date: 08/19/2017 DOB: [birth year]  Age: 66 y.o. MRN#: [id number] Attending Physician: Loletha Grayer, MD Primary Care Physician: System, Pcp Not In Admit Date: 08/16/2017  Reason for Consultation/Follow-up: Establishing goals of care  Subjective: After examining the patient I met with his wife and two daughters in the [place].    After discussing it privately last night the family agrees to a full DNR/DNI.    We discussed again his mental status (encephalopathy +/- ischemic brain injury from cardiac arrest), liver failure, heart failure, GI bleeding and poor nutritional status.   The family expressed that they did not want to see him suffer.  They feel he does not want to be "down in the bed".  They understand his frailty - they are struggling with planning next steps.   The family wants to support him but seems inclined to go full comfort measures if he has any further set backs.  We discussed SNF vs Hospice pending his progression.  He wife is unable to care for him at home.   Assessment: Encephalopathic with poor nutritional status and tenuous volume status.  Yesterday underwent APC of AVMs and banding of varices.  Stool in flexiseal appears black.  Needs constant attention to keep him from crawling/falling out of bed.   Patient Profile/HPI:  66 y.o.malewith past medical history of NASH cirrhosis, diabetes mellitus, and recurrent anemia (receiving regular transfusions [facility] was admitted on 9/16/2018with altered mental status and hyperglycemia. At home he was found unresponsive. He had been confused and vomiting blood. In the ED he suffered cardiac arrest x2 and was resuscitated. Admission work up showed severe anemia. His  lactic acid was elevated (21), Ph 7.1 and he required emergent intubation.      Length of Stay: 3  Current Medications: Scheduled Meds:  . chlorhexidine gluconate (MEDLINE KIT)  15 mL Mouth Rinse BID  . insulin aspart  0-20 Units Subcutaneous Q4H  . insulin glargine  10 Units Subcutaneous QHS  . mouth rinse  15 mL Mouth Rinse 10 times per day  . pantoprazole (PROTONIX) IV  40 mg Intravenous Q12H  . sodium chloride flush  10-40 mL Intracatheter Q12H    Continuous Infusions: . dextrose 5 % with kcl 100 mL/hr at  08/19/17 0851  . meropenem (MERREM) IV Stopped (08/18/17 2200)  . octreotide  (SANDOSTATIN)    IV infusion 50 mcg/hr (08/19/17 0700)    PRN Meds: [DISCONTINUED] ondansetron **OR** ondansetron (ZOFRAN) IV, sodium chloride flush  Physical Exam        Well developed male, awake able to tell me his name, children's names, and the president's name.  Confused, trying to get out of bed Eyes with icterus 1-2 CV tachy resp no distress Extremities swollen.  Lower extremities have patches of chronic skin damage   Vital Signs: BP 138/85   Pulse (!) 105   Temp 98.4 F (36.9 C) (Axillary)   Resp 16   Wt 129.6 kg (285 lb 11.5 oz)   SpO2 100%   BMI 43.44 kg/m  SpO2: SpO2: 100 % O2 Device: O2 Device: Nasal Cannula O2 Flow Rate: O2 Flow Rate (L/min): 2 L/min  Intake/output summary:  Intake/Output Summary (Last 24 hours) at 08/19/17 1219 Last data filed at 08/19/17 1100  Gross per 24 hour  Intake          2876.39 ml  Output             3440 ml  Net          -563.61 ml   LBM: Last BM Date: 08/18/17 Baseline Weight: Weight: 123.4 kg (272 lb) Most recent weight: Weight: 129.6 kg (285 lb 11.5 oz)       Palliative Assessment/Data:      Patient Active Problem List   Diagnosis Date Noted  . Hemorrhagic shock ([place])   . AKI (acute kidney injury) ([place])   . Hepatic cirrhosis ([place])   . Liver cirrhosis secondary to NASH ([place])   . Hematemesis   . Palliative care  encounter   . DNR (do not resuscitate) discussion   . Cardiac arrest ([place]) 08/16/2017  . 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 Sepsis ([place]) 05/28/2017    Palliative Care Plan    Recommendations/Plan:  Family changed code status to DNR  At this point want to continue full scope treatment - but would quickly go full comfort if he declines  Family/wife appreciates direct (straight forward) communication daily about his progress.  DId not do well with SLP eval.   Family does not want a feeding tube/PEG.  Family considering how they want him to live - - aggressive care vs palliative approach (hospice)   Goals of Care and Additional Recommendations:  Limitations on Scope of Treatment: Full Scope Treatment and No Artificial Feeding  Code Status:  DNR  Prognosis:  Days to weeks.  He is at high risk of an acute event at any time given cardiac arrest x 2 this admit, GI bleed, cirrhosis with encephalopathy  Discharge Planning:  To Be Determined  Care plan was discussed with Drs. Henri Medal, Multimedia programmer, Physiological scientist, and family.  Thank you for allowing the Palliative Medicine Team to assist in the care of this patient.  Total time spent: 60 min.      Greater than 50%  of this time was spent counseling and coordinating care related to the above assessment and plan.  Florentina Jenny, PA-C Palliative Medicine  Please contact Palliative MedicineTeam phone at [phone] for questions and concerns between 7 am - 7 pm.   Please see AMION for individual provider pager numbers.

## 2017-08-20 DIAGNOSIS — K7290 Hepatic failure, unspecified without coma: Secondary | ICD-10-CM

## 2017-08-20 LAB — CBC
HEMATOCRIT: 29.5 % — AB (ref 40.0–52.0)
Hemoglobin: 9.9 g/dL — ABNORMAL LOW (ref 13.0–18.0)
MCH: 28.9 pg (ref 26.0–34.0)
MCHC: 33.4 g/dL (ref 32.0–36.0)
MCV: 86.6 fL (ref 80.0–100.0)
PLATELETS: 42 10*3/uL — AB (ref 150–440)
RBC: 3.4 MIL/uL — ABNORMAL LOW (ref 4.40–5.90)
RDW: 19 % — ABNORMAL HIGH (ref 11.5–14.5)
WBC: 6.7 10*3/uL (ref 3.8–10.6)

## 2017-08-20 LAB — BASIC METABOLIC PANEL
Anion gap: 4 — ABNORMAL LOW (ref 5–15)
BUN: 63 mg/dL — ABNORMAL HIGH (ref 6–20)
CO2: 28 mmol/L (ref 22–32)
Calcium: 7.9 mg/dL — ABNORMAL LOW (ref 8.9–10.3)
Chloride: 121 mmol/L — ABNORMAL HIGH (ref 101–111)
Creatinine, Ser: 1.33 mg/dL — ABNORMAL HIGH (ref 0.61–1.24)
GFR calc Af Amer: >60 mL/min (ref >60)
GFR calc non Af Amer: 54 mL/min — ABNORMAL LOW (ref >60)
GLUCOSE: 136 mg/dL — AB (ref 65–99)
POTASSIUM: 4 mmol/L (ref 3.5–5.1)
Sodium: 153 mmol/L — ABNORMAL HIGH (ref 135–145)

## 2017-08-20 LAB — HEPATIC FUNCTION PANEL
ALT: 756 U/L — ABNORMAL HIGH (ref 17–63)
AST: 669 U/L — AB (ref 15–41)
Albumin: 2.4 g/dL — ABNORMAL LOW (ref 3.5–5.0)
Alkaline Phosphatase: 71 U/L (ref 38–126)
BILIRUBIN DIRECT: 4.9 mg/dL — AB (ref 0.1–0.5)
BILIRUBIN TOTAL: 7.8 mg/dL — AB (ref 0.3–1.2)
Indirect Bilirubin: 2.9 mg/dL — ABNORMAL HIGH (ref 0.3–0.9)
Total Protein: 5.7 g/dL — ABNORMAL LOW (ref 6.5–8.1)

## 2017-08-20 LAB — TYPE AND SCREEN
ABO/RH(D): O POS
ANTIBODY SCREEN: NEGATIVE
UNIT DIVISION: 0
UNIT DIVISION: 0
UNIT DIVISION: 0
UNIT DIVISION: 0
Unit division: 0
Unit division: 0
Unit division: 0

## 2017-08-20 LAB — BPAM RBC
BLOOD PRODUCT EXPIRATION DATE: 201810042359
BLOOD PRODUCT EXPIRATION DATE: 201810052359
BLOOD PRODUCT EXPIRATION DATE: 201810112359
Blood Product Expiration Date: 201809262359
Blood Product Expiration Date: 201809262359
Blood Product Expiration Date: 201810042359
Blood Product Expiration Date: 201810052359
ISSUE DATE / TIME: 201809190505
ISSUE DATE / TIME: 201809160129
ISSUE DATE / TIME: 201809160129
ISSUE DATE / TIME: 201809160530
ISSUE DATE / TIME: 201809170101
ISSUE DATE / TIME: 201809170817
ISSUE DATE / TIME: 201809190748
UNIT TYPE AND RH: 5100
UNIT TYPE AND RH: 5100
UNIT TYPE AND RH: 5100
UNIT TYPE AND RH: 9500
Unit Type and Rh: 5100
Unit Type and Rh: 5100
Unit Type and Rh: 5100

## 2017-08-20 LAB — GLUCOSE, CAPILLARY
GLUCOSE-CAPILLARY: 140 mg/dL — AB (ref 65–99)
GLUCOSE-CAPILLARY: 157 mg/dL — AB (ref 65–99)
GLUCOSE-CAPILLARY: 161 mg/dL — AB (ref 65–99)
GLUCOSE-CAPILLARY: 96 mg/dL (ref 65–99)
Glucose-Capillary: 129 mg/dL — ABNORMAL HIGH (ref 65–99)
Glucose-Capillary: 101 mg/dL — ABNORMAL HIGH (ref 65–99)
Glucose-Capillary: 133 mg/dL — ABNORMAL HIGH (ref 65–99)
Glucose-Capillary: 164 mg/dL — ABNORMAL HIGH (ref 65–99)

## 2017-08-20 LAB — PROTIME-INR
INR: 1.52
Prothrombin Time: 18.2 seconds — ABNORMAL HIGH (ref 11.4–15.2)

## 2017-08-20 LAB — VARICELLA-ZOSTER BY PCR: Varicella-Zoster, PCR: NEGATIVE

## 2017-08-20 MED ORDER — INSULIN ASPART 100 UNIT/ML ~~LOC~~ SOLN
0.0000 [IU] | SUBCUTANEOUS | Status: DC
  Administered 2017-08-20 (×2): 2 [IU] via SUBCUTANEOUS
  Administered 2017-08-21: 19:00:00 8 [IU] via SUBCUTANEOUS
  Administered 2017-08-21: 11 [IU] via SUBCUTANEOUS
  Administered 2017-08-21: 3 [IU] via SUBCUTANEOUS
  Administered 2017-08-21: 5 [IU] via SUBCUTANEOUS
  Administered 2017-08-21: 12:00:00 11 [IU] via SUBCUTANEOUS
  Administered 2017-08-21: 3 [IU] via SUBCUTANEOUS
  Administered 2017-08-22: 13:00:00 5 [IU] via SUBCUTANEOUS
  Administered 2017-08-22: 08:00:00 7 [IU] via SUBCUTANEOUS
  Administered 2017-08-22 (×2): 5 [IU] via SUBCUTANEOUS
  Administered 2017-08-22: 04:00:00 3 [IU] via SUBCUTANEOUS
  Filled 2017-08-20 (×12): qty 1

## 2017-08-20 MED ORDER — INSULIN GLARGINE 100 UNIT/ML ~~LOC~~ SOLN
5.0000 [IU] | Freq: Every day | SUBCUTANEOUS | Status: DC
  Administered 2017-08-21: 5 [IU] via SUBCUTANEOUS
  Filled 2017-08-20 (×3): qty 0.05

## 2017-08-20 MED ORDER — HALOPERIDOL LACTATE 5 MG/ML IJ SOLN
1.0000 mg | INTRAMUSCULAR | Status: DC | PRN
  Administered 2017-08-20: 2 mg via INTRAVENOUS
  Filled 2017-08-20: qty 1

## 2017-08-20 MED ORDER — POTASSIUM CL IN DEXTROSE 5% 20 MEQ/L IV SOLN
20.0000 meq | INTRAVENOUS | Status: DC
  Administered 2017-08-20 – 2017-08-21 (×2): 20 meq via INTRAVENOUS
  Filled 2017-08-20 (×5): qty 1000

## 2017-08-20 NOTE — Progress Notes (Signed)
PULMONARY / CRITICAL CARE MEDICINE   Name: Jesus [name] MRN: [id number] DOB: 1951/08/07    ADMISSION DATE:  08/16/2017   PT PROFILE: 55 M with hx of cirrhosis and chronic iron def anemia adm via ED 09/16 when he presented hematemesis then suffered PEA arrest in ED and 40 mins ACLS.   MAJOR EVENTS/TEST RESULTS: 09/16 adm with above history. Intubated during ACLS. After ROSC, required vasopressors.  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 Treated with argon plasma coagulation. Bleeding grade II esophageal varices. Incompletely eradicated. Banded. 09/17 Cardiac markers elevated (trop I 16.81) 09/17 RUQ US/Liver Doppler: Findings worrisome for hepatic cirrhosis and portal venous hypertension including nodular hepatic contour, splenomegaly and small volume intra-abdominal ascites. Hepatic vascular system appears widely patent with normal directional flow. Limited but grossly normal hepatic Doppler. Splenomegaly 09/18 Passed SBT and extubated. Off vasopressors 09/18 Echocardiogram: EF 55%-60% 09/19 Received 2 units PRBCs for reported Hgb 4.5 - Probably a factitious value. After transfusion, Hgb 9.8 09/19 Palliative Care met with family. DNR/DNI established 09/20 remains poorly oriented. No distress. More responsive. Transfer to MedSurg floor ordered  INDWELLING DEVICES:: ETT 09/16 >> 09/18 L femoral CVL 09/16 >> 09/20 (removal ordered contingent on PIV placement) R femoral A-line 09/16 >>09/19   MICRO DATA: MRSA PCR 09/16 >>negative  Urine 09/16 >>multiple species Blood 09/16 >>negative    ANTIMICROBIALS:  Vanc  09/16 >>09/17 Metronidazole 09/16 >>09/17 Meropenem 09/16 >> 09/22 (stop date ordered)  PCT  09/16: 0.78, 3.93, 4.89, 3.33  SUBJECTIVE:  More responsive. Poorly oriented. Occasionally agitated. No distress  VITAL SIGNS: BP 138/79   Pulse 100   Temp 98.3 F (36.8 C) (Oral)   Resp 13   Wt  129.6 kg (285 lb 11.5 oz)   SpO2 95%   BMI 43.44 kg/m   HEMODYNAMICS:    VENTILATOR SETTINGS:    INTAKE / OUTPUT: I/O last 3 completed shifts: In: 5418.6 [I.V.:3978.6; Blood:1040; IV Piggyback:400] Out: 9518 [Urine:3540]  PHYSICAL EXAMINATION: General: NAD Neuro: RASS 0, + F/C, MAEs HEENT: NCAT, + scericterus Cardiovascular: reg, no M Lungs: Clear anteriorly Abdomen: Obese, soft, + BS Extremities: Symmetric pretibial edema  LABS:  BMET  Recent Labs Lab 08/18/17 0405 08/19/17 0322 08/20/17 0438  NA 146* 150* 153*  K 3.3* 3.3* 4.0  CL 115* 119* 121*  CO2 _0 BUN 95* 81* 63*  CREATININE 2.01* 1.58* 1.33*  GLUCOSE 264* 201* 136*    Electrolytes  Recent Labs Lab 08/16/17 0456  08/17/17 0011 08/17/17 0555 08/18/17 0405 08/19/17 0322 08/20/17 0438  CALCIUM 8.7*  < > 7.8* 7.6* 7.4* 7.0* 7.9*  MG 2.0  --  1.4* 1.3* 2.2  --   --   PHOS 7.8*  --   --  2.6  --   --   --   < > = values in this interval not displayed.  CBC  Recent Labs Lab 08/19/17 0322 08/19/17 1257 08/20/17 0438  WBC 4.0 6.8 6.7  HGB 4.5* 9.8* 9.9*  HCT 13.8* 29.2* 29.5*  PLT 30* 46* 42*    Coag's  Recent Labs Lab 08/16/17 0950  08/18/17 0405 08/19/17 0322 08/19/17 1257 08/20/17 0438  APTT 43*  --  37* 48*  --   --   INR 2.84  < > 1.91 2.68 1.54 1.52  < > = values in this interval not displayed.  Sepsis Markers  Recent Labs Lab 08/16/17 0051  08/16/17 0422 08/17/17 0555 08/18/17 0405 08/19/17 0322  LATICACIDVEN 21.9*  --  18.6* 4.1*  --   --   PROCALCITON  --   < > 0.78 3.93 4.89 3.33  < > = values in this interval not displayed.  ABG  Recent Labs Lab 08/16/17 0630 08/16/17 1417 08/17/17 0500  PHART 7.10* 7.41 7.37  PCO2ART 41 40 37  PO2ART 105 94 75*    Liver Enzymes  Recent Labs Lab 08/18/17 0405 08/19/17 0322 08/20/17 0438  AST 582* 714* 669*  ALT 596* 674* 756*  ALKPHOS 68 56 71  BILITOT 3.5* 4.6* 7.8*  ALBUMIN 2.3* 1.9* 2.4*     Cardiac Enzymes  Recent Labs Lab 08/16/17 0950 08/16/17 1745 08/17/17 1449  TROPONINI 6.53* 16.81* 5.41*    Glucose  Recent Labs Lab 08/19/17 1949 08/20/17 0008 08/20/17 0421 08/20/17 0710 08/20/17 0751 08/20/17 1136  GLUCAP 177* 161* 129* 101* 96 140*    CXR: NNF   ASSESSMENT / PLAN: VDRF after cardiac arrest - resolved Pulmonary edema - resolved  Hemorrhagic/hypovolemic shock - resolved Hypokalemia - resolved AKI - resolved  Elevated troponin I - likely supply/demand imbalance  Not a candidate for aggressive evaluation  Continue hemodynamic monitoring Hypernatremia  Continue D5W Cirrhosis with elevated LFTs  Monitor LFTs intermittently UGIB - gastric angioectasias and esophageal varices - No evidence of active bleeding currently  Monitor CBC intermittently Post extubation dysphagia  SLP to reevaluate today and advance diet as able Acute blood loss anemia  No indication for further transfusion presently Coagulopathy due to liver failure  Continue to monitor intermittently Thrombocytopenia  No indication for platelet transfusion presently. Monitor intermittently Elevated PCT, no obvious infection  Complete 7 days meropenem empirically  Stop date ordered DM 2 - much improved control  Lantus dose decreased 09/20  SSI changed to moderate scale  If diet initiated, will need to change to ACHS schedule Acute encephalopathy - metabolic (liver) and/or anoxic injury  Resume lactulose and rifaximin when able   Discussed with Dr. Earleen Newport. We will transfer to [place] floor. As long as he is continuing to improve, we will continue the above measures. If he deteriorates in any way, the family wishes to proceed with a focus on comfort.    Merton Border, MD PCCM service Mobile [phone] Pager [phone] 08/20/2017 12:08 PM

## 2017-08-20 NOTE — Progress Notes (Signed)
Physical Therapy Evaluation Patient Details Name: Jesus [name] MRN: [id number] DOB: Jun 12, 1951 Today's Date: 08/20/2017   History of Present Illness  Pt admitted for cardiac arrest with complaints of AMS and hematemesis. PMH of cirrhosis, chronic iron deficiency, DM, hyperglycemia, cardiac arrest. Pt had two episodes of cardiac arrest in ED, went through 2 rounds of CPR, intubated from 9/16-9/18. Had severe GI bleed post EGD on 9/17, required multiple transfusions. Has been confused and agitated, has mitts on hands and has been on Haldol.     Clinical Impression   Pt is a pleasant 66 year old male admitted for cardiac arrest. Pt was able to greet me, however appeared very lethargic and sleepy, able to understand basic commands. Pt performed supine there-ex 5 times and able to raise hand, however participation was limited due to sleepiness. Pt performed bed mobility of rolling to R side, able to initiate with positioning of L foot and hand but unable to shift weight, total assist.  Pt's wife reported pt was much more awake and responsive this morning, responded to questions about pt history. Unable to get an accurate assessment of pt's mobility. Will re-assess at next session when pt is more awake and able to participate.     Follow Up Recommendations SNF    Equipment Recommendations  Rolling walker with 5" wheels    Recommendations for Other Services       Precautions / Restrictions Precautions Precautions: Fall Restrictions Weight Bearing Restrictions: No      Mobility  Bed Mobility Overal bed mobility: Needs Assistance Bed Mobility: Rolling Rolling: Total assist         General bed mobility comments: Pt able to initiate roll to the R by pushing L heel into bed and reaching over with the L arm. However unable to utilize bed rails due to mitts on both hands. Unable to shift bodyweight and turn onto side wihtout total assist.   Transfers                 General  transfer comment: Deferred  Ambulation/Gait             General Gait Details: Deferred  Stairs            Wheelchair Mobility    Modified Rankin (Stroke Patients Only)       Balance Overall balance assessment:  (deferred)                                           Pertinent Vitals/Pain Pain Assessment: Faces Faces Pain Scale: No hurt    Home Living Family/patient expects to be discharged to:: Private residence Living Arrangements: Spouse/significant other Available Help at Discharge: Family Type of Home: House Home Access: Stairs to enter;Ramped entrance [employer] entrance has stairs, rear entrance has ramp) Entrance Stairs-Rails: Lawyer of Steps: 4 Home Layout: One level Home Equipment: None      Prior Function Level of Independence: Needs assistance         Comments: Pt's wife reports pt did not use an assistive device, however has a shuffling gait pattern and does not lift feet. Pt spends most of the time at home, and helps care for him.      Hand Dominance        Extremity/Trunk Assessment   Upper Extremity Assessment Upper Extremity Assessment: Generalized weakness (Pt able to lift R UE,  did not provide resistance)    Lower Extremity Assessment Lower Extremity Assessment: Generalized weakness (Pt able to perform small movements with LEs)       Communication   Communication: No difficulties  Cognition Arousal/Alertness: Lethargic Behavior During Therapy: WFL for tasks assessed/performed Overall Cognitive Status: Difficult to assess                                        General Comments      Exercises Other Exercises Other Exercises: Supine ther-ex performed 5x both legs, ankle pumps, SLR with mod assist, heel slides of a few inches.     Assessment/Plan    PT Assessment Patient needs continued PT services  PT Problem List Decreased strength;Decreased range of  motion;Decreased activity tolerance       PT Treatment Interventions DME instruction;Gait training;Therapeutic activities;Therapeutic exercise;Balance training;Patient/family education    PT Goals (Current goals can be found in the Care Plan section)  Acute Rehab PT Goals Patient Stated Goal: pt unable to respond due to lethargy  PT Goal Formulation: Patient unable to participate in goal setting Additional Goals Additional Goal #1: Pt will perform UE and LE exercises to maintain strength and ROM    Frequency Min 2X/week   Barriers to discharge        Co-evaluation               AM-PAC PT "6 Clicks" Daily Activity  Outcome Measure Difficulty turning over in bed (including adjusting bedclothes, sheets and blankets)?: Unable Difficulty moving from lying on back to sitting on the side of the bed? : Unable Difficulty sitting down on and standing up from a chair with arms (e.g., wheelchair, bedside commode, etc,.)?: Unable Help needed moving to and from a bed to chair (including a wheelchair)?: Total Help needed walking in hospital room?: Total Help needed climbing 3-5 steps with a railing? : Total 6 Click Score: 6    End of Session   Activity Tolerance: Patient limited by lethargy Patient left: in bed;with bed alarm set;with family/visitor present Nurse Communication: Mobility status PT Visit Diagnosis: Other abnormalities of gait and mobility (R26.89);Muscle weakness (generalized) (M62.81)    Time: 1610-9604 PT Time Calculation (min) (ACUTE ONLY): 31 min   Charges:   PT Evaluation $PT Eval High Complexity: 1 High     PT G Codes:   PT G-Codes **NOT FOR INPATIENT CLASS** Functional Assessment Tool Used: AM-PAC 6 Clicks Basic Mobility Functional Limitation: Mobility: Walking and moving around Mobility: Walking and Moving Around Current Status (V4098): 100 percent impaired, limited or restricted Mobility: Walking and Moving Around Goal Status (J1914): At least 80  percent but less than 100 percent impaired, limited or restricted    Renford Dills, SPT  Renford Dills 08/20/2017, 5:23 PM

## 2017-08-20 NOTE — Progress Notes (Addendum)
Daily Progress Note   Patient Name: Jesus [name]       Date: 08/20/2017 DOB: 1951/07/12  Age: 66 y.o. MRN#: [id number] Attending Physician: Loletha Grayer, MD Primary Care Physician: System, Pcp Not In Admit Date: 08/16/2017  Reason for Consultation/Follow-up: Establishing goals of care  Subjective: Met with wife and two daughters at bedside.  Patient required haldol for agitation today and is in mitts.  Family is questioning - will he return to a satisfactory quality of life? He would not want to live in a facility - he does not do well in rehab and wife can not care for him at home.    We discussed that his cirrhosis is chronic and progressive.  He has been dealing with GI bleeding for 3 years.  Now - the bleeding is more frequent and he becomes encephalopathic when he bleeds because his cirrhosis has become worse.    I asked family to consider making him full comfort if he declines at all.  They seem to be amenable to this idea.  A neuro work up is being considered (imaging) to determine whether or not he also has ischemic brain injury from his recent cardiac arrests.  Family would appreciate this information.  Family is stressed.  They do not want him transferred to the [place].     Assessment:  Encephalopathic with poor nutritional status (albumin 1.9) and tenuous volume status.  This admit underwent APC of AVMs and banding of varices.  Stool in flexiseal appears black.  Needs constant attention to keep him from crawling/falling out of bed.  He is progressing in that he was started on a diet 9/20.  Patient Profile/HPI:  66 y.o.malewith past medical history of NASH cirrhosis, diabetes mellitus, and recurrent anemia (receiving regular transfusions at [facility] was admitted on 9/16/2018with  altered mental status and hyperglycemia. At home he was found unresponsive. He had been confused and vomiting blood. In the ED he suffered cardiac arrest x2 and was resuscitated. Admission work up showed severe anemia. His lactic acid was elevated (21), Ph 7.1 and he required emergent intubation.   Length of Stay: 4  Current Medications: Scheduled Meds:  . chlorhexidine gluconate (MEDLINE KIT)  15 mL Mouth Rinse BID  . insulin aspart  0-15 Units Subcutaneous Q4H  . insulin glargine  5 Units Subcutaneous  QHS  . mouth rinse  15 mL Mouth Rinse 10 times per day  . pantoprazole (PROTONIX) IV  40 mg Intravenous Q12H  . sodium chloride flush  10-40 mL Intracatheter Q12H    Continuous Infusions: . dextrose 5 % with KCl 20 mEq / L 20 mEq (08/20/17 1000)  . meropenem (MERREM) IV Stopped (08/20/17 0948)    PRN Meds: haloperidol lactate, hydrALAZINE, LORazepam, [DISCONTINUED] ondansetron **OR** ondansetron (ZOFRAN) IV, sodium chloride flush  Physical Exam        Well developed obese male, confused but will answer simple questions CV rrr resp no distress Abdomen obese  Ext swelling decreased but still present.  Lower extremity changes of venous stasis.  Vital Signs: BP 123/81   Pulse 99   Temp 98.4 F (36.9 C) (Oral)   Resp 12   Wt 129.6 kg (285 lb 11.5 oz)   SpO2 96%   BMI 43.44 kg/m  SpO2: SpO2: 96 % O2 Device: O2 Device: Not Delivered O2 Flow Rate: O2 Flow Rate (L/min): 2 L/min  Intake/output summary:  Intake/Output Summary (Last 24 hours) at 08/20/17 1551 Last data filed at 08/20/17 1400  Gross per 24 hour  Intake          3675.14 ml  Output             2650 ml  Net          1025.14 ml   LBM: Last BM Date: 08/18/17 Baseline Weight: Weight: 123.4 kg (272 lb) Most recent weight: Weight: 129.6 kg (285 lb 11.5 oz)       Palliative Assessment/Data:      Patient Active Problem List   Diagnosis Date Noted  . Acute blood loss anemia   . Coagulopathy ([place])   . Acute  encephalopathy   . DNR (do not resuscitate)   . Hemorrhagic shock ([place])   . AKI (acute kidney injury) ([place])   . Hepatic cirrhosis ([place])   . Liver cirrhosis secondary to NASH ([place])   . Hematemesis   . Palliative care encounter   . DNR (do not resuscitate) discussion   . Cardiac arrest ([place]) 08/16/2017  . 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 Sepsis ([place]) 05/28/2017    Palliative Care Plan    Recommendations/Plan:  PMT will continue to support the patient and family and assist in making [place] decisions.  I believe the family is very likely to change to full comfort if/when he declines.  Goals of Care and Additional Recommendations:  Limitations on Scope of Treatment: Full Scope Treatment  Code Status:  DNR  Prognosis:   Unable to determine - at very high risk to decline quickly given recurrent GI bleeding, hepatic encephalopathy, shocked liver, cardiac arrest x 2 with diastolic heart failure in the setting of chronic kidney disease.   Discharge Planning:  To Be Determined  Care plan was discussed with CCM, PMT, bedside RN and family.  Thank you for allowing the Palliative Medicine Team to assist in the care of this patient.  Total time spent:  45 min.     Greater than 50%  of this time was spent counseling and coordinating care related to the above assessment and plan.  Florentina Jenny, PA-C Palliative Medicine  Please contact Palliative MedicineTeam phone at [phone] for questions and concerns between 7 am - 7 pm.   Please see AMION for individual provider pager numbers.

## 2017-08-20 NOTE — Progress Notes (Signed)
Pharmacy Antibiotic Note  Jesus [name] is a 66 y.o. male admitted on 08/16/2017 with sepsis.  Pharmacy has been consulted for meropenem dosing.   PCT increasing, SCr decreasing, and varices cannot be r/o by EGD per GI.   Plan: Continue meropenem to 2 g IV q 12 hours. Stop date 9/24.   Weight: 285 lb 11.5 oz (129.6 kg)  Temp (24hrs), Avg:98.5 F (36.9 C), Min:98 F (36.7 C), Max:98.9 F (37.2 C)   Recent Labs Lab 08/16/17 0051 08/16/17 0422  08/17/17 0011 08/17/17 0555 08/17/17 1629 08/18/17 0405 08/19/17 0322 08/19/17 1257 08/20/17 0438  WBC  --   --   < > 18.0* 10.0 7.9 7.8 4.0 6.8 6.7  CREATININE  --   --   < > 2.33* 2.30*  --  2.01* 1.58*  --  1.33*  LATICACIDVEN 21.9* 18.6*  --   --  4.1*  --   --   --   --   --   < > = values in this interval not displayed.  Estimated Creatinine Clearance: 71.8 mL/min (A) (by C-G formula based on SCr of 1.33 mg/dL (H)).    Allergies  Allergen Reactions  . Penicillins Hives and Other (See Comments)    Has patient had a PCN reaction causing immediate rash, facial/tongue/throat swelling, SOB or lightheadedness with hypotension: Yes Has patient had a PCN reaction causing severe rash involving mucus membranes or skin necrosis: No Has patient had a PCN reaction that required hospitalization: No Has patient had a PCN reaction occurring within the last 10 years: No If all of the above answers are "NO", then may proceed with Cephalosporin use.    ABX: 9/16 Aztreonam x 1 9/16 Levaquin x 1 9/16 Meropenem >>  9/16 Vancomycin x 1  Micro:  9/16 MRSA PCR: negative 9/16 BCx: NGTD UCx: multiple species  Thank you for allowing pharmacy to be a part of this patient's care.  Dwain Sarna, PharmD Student  08/20/2017

## 2017-08-20 NOTE — Progress Notes (Signed)
Patient ID: CARRSON LIGHTCAP, male   DOB: Nov 16, 1951, 66 y.o.   MRN: [id number]   [facility] PROGRESS NOTE  RAJON BISIG RCV:[id number] DOB: [birth year] DOA: 08/16/2017 PCP: System, Pcp Not In  HPI/Subjective: Patient answers no to all questions. He was trying to pull out his IV earlier. Still pretty confused.  Objective: Vitals:   08/20/17 1400 08/20/17 1500  BP: 113/66 123/81  Pulse: 97 99  Resp: 12 12  Temp: 98.4 F (36.9 C)   SpO2: 94% 96%    Filed Weights   08/16/17 0045 08/18/17 0247  Weight: 123.4 kg (272 lb) 129.6 kg (285 lb 11.5 oz)    ROS: Review of Systems  Unable to perform ROS: Acuity of condition  Respiratory: Negative for shortness of breath.   Cardiovascular: Negative for chest pain.  Gastrointestinal: Negative for abdominal pain, nausea and vomiting.   Exam: Physical Exam  Eyes: Pupils are equal, round, and reactive to light. Lids are normal.  icteric  Neck: No JVD present. Carotid bruit is not present. No edema present. No thyroid mass and no thyromegaly present.  Cardiovascular: S1 normal and S2 normal.  Tachycardia present.  Exam reveals no gallop.   Murmur heard.  Systolic murmur is present with a grade of 3/6  Pulses:      Dorsalis pedis pulses are 1+ on the right side, and 1+ on the left side.  Respiratory: No respiratory distress. He has decreased breath sounds in the right lower field and the left lower field. He has no wheezes. He has rhonchi in the right lower field and the left lower field. He has no rales.  Upper airway wheeze  GI: Soft. Bowel sounds are normal. There is no tenderness.  Musculoskeletal:       Right ankle: He exhibits swelling.       Left ankle: He exhibits swelling.  Lymphadenopathy:    He has no cervical adenopathy.  Neurological: He is alert.  Able to lift his right arm up and squeeze bilateral hands. Able to move bilateral legs on his own.  Skin: Skin is warm. Nails show no clubbing.  Chronic lower extremity  discoloration on bilateral shins.  Psychiatric: His speech is delayed.  Patient slow with answers      Data Reviewed: Basic Metabolic Panel:  Recent Labs Lab 08/16/17 0456  08/17/17 0011 08/17/17 0555 08/18/17 0405 08/19/17 0322 08/20/17 0438  NA 143  < > 144 143 146* 150* 153*  K 4.3  < > 3.5 3.9 3.3* 3.3* 4.0  CL 105  < > 110 112* 115* 119* 121*  CO2 11*  < > 25 23 26 26 28   GLUCOSE 649*  < > 178* 228* 264* 201* 136*  BUN 89*  < > 97* 96* 95* 81* 63*  CREATININE 2.29*  < > 2.33* 2.30* 2.01* 1.58* 1.33*  CALCIUM 8.7*  < > 7.8* 7.6* 7.4* 7.0* 7.9*  MG 2.0  --  1.4* 1.3* 2.2  --   --   PHOS 7.8*  --   --  2.6  --   --   --   < > = values in this interval not displayed. Liver Function Tests:  Recent Labs Lab 08/16/17 0049 08/16/17 0456 08/18/17 0405 08/19/17 0322 08/20/17 0438  AST 122* 618* 582* 714* 669*  ALT 77* 392* 596* 674* 756*  ALKPHOS 67 109 68 56 71  BILITOT 1.2 1.3* 3.5* 4.6* 7.8*  PROT 5.0* 4.9* 4.8* 4.5* 5.7*  ALBUMIN 2.3* 2.2* 2.3*  1.9* 2.4*    Recent Labs Lab 08/16/17 0456  LIPASE 33  AMYLASE 145*    Recent Labs Lab 08/16/17 0051 08/16/17 1058 08/17/17 2129  AMMONIA 177* 325* 78*   CBC:  Recent Labs Lab 08/16/17 0049  08/17/17 1629 08/18/17 0405 08/19/17 0322 08/19/17 1257 08/20/17 0438  WBC 19.0*  < > 7.9 7.8 4.0 6.8 6.7  NEUTROABS 15.9*  --   --  5.2 2.5  --   --   HGB 5.8*  < > 7.5* 7.4* 4.5* 9.8* 9.9*  HCT 20.5*  < > 22.6* 22.2* 13.8* 29.2* 29.5*  MCV 99.6  < > 85.9 86.2 88.8 85.8 86.6  PLT 174  < > 33* 43* 30* 46* 42*  < > = values in this interval not displayed. Cardiac Enzymes:  Recent Labs Lab 08/16/17 0049 08/16/17 0456 08/16/17 0950 08/16/17 1745 08/17/17 1449  CKTOTAL 171  --   --   --   --   TROPONINI 0.06* 0.45* 6.53* 16.81* 5.41*    CBG:  Recent Labs Lab 08/20/17 0008 08/20/17 0421 08/20/17 0710 08/20/17 0751 08/20/17 1136  GLUCAP 161* 129* 101* 96 140*    Recent Results (from the past 240  hour(s))  Blood Culture (routine x 2)     Status: None (Preliminary result)   Collection Time: 08/16/17 12:50 AM  Result Value Ref Range Status   Specimen Description BLOOD LEFT ARM  Final   Special Requests   Final    BOTTLES DRAWN AEROBIC AND ANAEROBIC Blood Culture adequate volume   Culture NO GROWTH 4 DAYS  Final   Report Status PENDING  Incomplete  Blood Culture (routine x 2)     Status: None (Preliminary result)   Collection Time: 08/16/17 12:51 AM  Result Value Ref Range Status   Specimen Description BLOOD LEFT ANTECUBITAL  Final   Special Requests   Final    BOTTLES DRAWN AEROBIC AND ANAEROBIC Blood Culture adequate volume   Culture NO GROWTH 4 DAYS  Final   Report Status PENDING  Incomplete  Urine culture     Status: Abnormal   Collection Time: 08/16/17 12:51 AM  Result Value Ref Range Status   Specimen Description URINE, RANDOM  Final   Special Requests NONE  Final   Culture MULTIPLE SPECIES PRESENT, SUGGEST RECOLLECTION (A)  Final   Report Status 08/17/2017 FINAL  Final  MRSA PCR Screening     Status: None   Collection Time: 08/16/17  4:56 AM  Result Value Ref Range Status   MRSA by PCR NEGATIVE NEGATIVE Final    Comment:        The GeneXpert MRSA Assay (FDA approved for NASAL specimens only), is one component of a comprehensive MRSA colonization surveillance program. It is not intended to diagnose MRSA infection nor to guide or monitor treatment for MRSA infections.      Studies: Dg Chest Port 1 View  Result Date: 08/19/2017 CLINICAL DATA:  Respiratory failure EXAM: PORTABLE CHEST 1 VIEW COMPARISON:  08/18/2017 FINDINGS: Cardiomegaly with vascular congestion. Small right pleural effusion and right base atelectasis. No overt edema. No acute bony abnormality. IMPRESSION: Cardiomegaly with vascular congestion.  Improving edema pattern. Small right effusion with right base atelectasis. Electronically Signed   By: Rolm Baptise M.D.   On: 08/19/2017 08:20     Scheduled Meds: . chlorhexidine gluconate (MEDLINE KIT)  15 mL Mouth Rinse BID  . insulin aspart  0-15 Units Subcutaneous Q4H  . insulin glargine  5 Units Subcutaneous QHS  .  mouth rinse  15 mL Mouth Rinse 10 times per day  . pantoprazole (PROTONIX) IV  40 mg Intravenous Q12H  . sodium chloride flush  10-40 mL Intracatheter Q12H   Continuous Infusions: . dextrose 5 % with KCl 20 mEq / L 20 mEq (08/20/17 1000)  . meropenem (MERREM) IV Stopped (08/20/17 8453)    Assessment/Plan:  1. Cardiac arrest. Likely secondary to respiratory arrest and/or GI bleed. 2. Hemorrhagic shock versus septic shock. Patient off all pressors at this point. Empiric antibiotics with meropenem. Cultures are negative. Endoscopy showed angiectasias that were cauterized.  Critical care specialist stopped octreotide drip. Continue Protonix. 3. Acute hypoxic respiratory failure. Extubated 08/18/2017. Continue nasal cannula oxygen. 4. Metabolic acidosis and lactic acidosis secondary to respiratory failure, shock. 5. Severe anemia. Patient received 5 units of pack red blood cells during the hospital course.  Today's hemoglobin 9.9. 6. Acute kidney injury. Creatinine trending better and last creatinine 1.33. 7. Hypernatremia and patient started on D5W. sodium 153 today.  8. Hypokalemia replace potassium in IV fluids 9. Elevated liver function tests likely secondary to cardiac arrest and shock liver. 10. Liver cirrhosis seen on ultrasound 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  Patient very slow with his answers.  Could also be hepatic encephalopathy.  Ammonia level ordered for tomorrow. If still elevated will start back on Xifaxan and lactulose. 12. Empiric Vitamin K given for high INR. This likely won't improve if it secondary to the liver. 13. Elevated troponin of 16.81 likely demand ischemia secondary to shock. Limited with a GI bleed and unable to give any anticoagulation. Also Limited with hypotension.  Code Status:      Code Status Orders        Start     Ordered   08/16/17 0405  Full code  Continuous     08/16/17 0404    Code Status History    Date Active Date Inactive Code Status Order ID Comments User Context   05/28/2017  5:15 AM 06/04/2017  8:10 PM Full Code [id number]  Harrie Foreman, MD Inpatient     Family Communication: as per critical care specialist Disposition Plan: Patient will likely need long-term placement   Consultants:  Critical care specialist  gastroenterology  Procedures:  Upper endoscopy  Antibiotics:  meropenem  Time spent: 24 minutes  [place], [facility]

## 2017-08-20 NOTE — Progress Notes (Signed)
Jesus Bellows MD, MRCP(U.K) [address]  [place]  [place], [place] [address]  Main: [place] is being followed for variceal bleed, acute liver failure   Subjective: No further hematemesis , drowsy    Objective: Vital signs in last 24 hours: Vitals:   08/20/17 0700 08/20/17 0800 08/20/17 0900 08/20/17 1000  BP: 138/89 136/84 138/79   Pulse: (!) 101 (!) 104 100 100  Resp: 16 15 17 13   Temp:  98.3 F (36.8 C)    TempSrc:  Oral    SpO2: 100% 94% 97% 95%  Weight:       Weight change:   Intake/Output Summary (Last 24 hours) at 08/20/17 1129 Last data filed at 08/20/17 3729  Gross per 24 hour  Intake          3375.14 ml  Output             1650 ml  Net          1725.14 ml     Exam: Heart:: Regular rate and rhythm, S1S2 present or without murmur or extra heart sounds Lungs: normal, clear to auscultation and clear to auscultation and percussion Abdomen: soft, nontender, normal bowel sounds   Lab Results: @LABTEST2 @ Micro Results: Recent Results (from the past 240 hour(s))  Blood Culture (routine x 2)     Status: None (Preliminary result)   Collection Time: 08/16/17 12:50 AM  Result Value Ref Range Status   Specimen Description BLOOD LEFT ARM  Final   Special Requests   Final    BOTTLES DRAWN AEROBIC AND ANAEROBIC Blood Culture adequate volume   Culture NO GROWTH 4 DAYS  Final   Report Status PENDING  Incomplete  Blood Culture (routine x 2)     Status: None (Preliminary result)   Collection Time: 08/16/17 12:51 AM  Result Value Ref Range Status   Specimen Description BLOOD LEFT ANTECUBITAL  Final   Special Requests   Final    BOTTLES DRAWN AEROBIC AND ANAEROBIC Blood Culture adequate volume   Culture NO GROWTH 4 DAYS  Final   Report Status PENDING  Incomplete  Urine culture     Status: Abnormal   Collection Time: 08/16/17 12:51 AM  Result Value Ref Range Status   Specimen Description URINE, RANDOM  Final   Special Requests NONE   Final   Culture MULTIPLE SPECIES PRESENT, SUGGEST RECOLLECTION (A)  Final   Report Status 08/17/2017 FINAL  Final  MRSA PCR Screening     Status: None   Collection Time: 08/16/17  4:56 AM  Result Value Ref Range Status   MRSA by PCR NEGATIVE NEGATIVE Final    Comment:        The GeneXpert MRSA Assay (FDA approved for NASAL specimens only), is one component of a comprehensive MRSA colonization surveillance program. It is not intended to diagnose MRSA infection nor to guide or monitor treatment for MRSA infections.    Studies/Results: Dg Chest Port 1 View  Result Date: 08/19/2017 CLINICAL DATA:  Respiratory failure EXAM: PORTABLE CHEST 1 VIEW COMPARISON:  08/18/2017 FINDINGS: Cardiomegaly with vascular congestion. Small right pleural effusion and right base atelectasis. No overt edema. No acute bony abnormality. IMPRESSION: Cardiomegaly with vascular congestion.  Improving edema pattern. Small right effusion with right base atelectasis. Electronically Signed   By: Rolm Baptise M.D.   On: 08/19/2017 08:20   Medications: I have reviewed the patient's current medications. Scheduled Meds: . chlorhexidine gluconate (MEDLINE KIT)  15 mL Mouth Rinse BID  . insulin aspart  0-15 Units Subcutaneous Q4H  . insulin glargine  5 Units Subcutaneous QHS  . mouth rinse  15 mL Mouth Rinse 10 times per day  . pantoprazole (PROTONIX) IV  40 mg Intravenous Q12H  . sodium chloride flush  10-40 mL Intracatheter Q12H   Continuous Infusions: . dextrose 5 % with KCl 20 mEq / L    . meropenem (MERREM) IV Stopped (08/20/17 0948)   PRN Meds:.haloperidol lactate, hydrALAZINE, LORazepam, [DISCONTINUED] ondansetron **OR** ondansetron (ZOFRAN) IV, sodium chloride flush   Assessment: Active Problems:   Cardiac arrest ([place])   Hemorrhagic shock (H[facility])   AKI (acute kidney injury) ([place])   Hepatic cirrhosis ([place])   Liver cirrhosis secondary to NASH ([place])   Hematemesis   Palliative care encounter   DNR (do not  resuscitate) discussion   Acute blood loss anemia   Coagulopathy (HCC)   Acute encephalopathy   DNR (do not resuscitate)  Jesus Sierras Rouse65 y.o.maleadmitted to the ICU with PEA arrest x 2 , acute on chronic anemia with prior evaluation at the [facility] with no source of blood loss seen. H/o liver cirrhosis . On this admission noted to have a possible shocked liver, on pressors , received blood. 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 Variceal bleed- stable, Can feed orally  .  Repeat EGD as outpatient in 2-3 weeks for eradication of all varices. Commence on nadolol 20 mg closer to discharge   2. Acute liver failure- likely from shocked liver ,INR stable . Bilirubin can rise for a few days after the acute event and then start dropiing. Commence on Lactulose and Xifaxan for hepatic encephelopathy and dietician consult for oral low sodium diet     LOS: 4 days   Jesus [name] 08/20/2017, 11:29 AM

## 2017-08-20 NOTE — Care Management (Signed)
Per MD patient stable [facility] if family chouoses. I spoke by phone with daughter Efraim Kaufmann [phone], and she states she is not ready to complete [facility] forms. I explained forms again to her. She and her mother do not want him transferred to [facility]. His PCP is with [facility]. She promised to bring [facility] forms back completed that patient will stay at [facility] for treatment.

## 2017-08-20 NOTE — Progress Notes (Signed)
Nutrition Follow-up  DOCUMENTATION CODES:   Morbid obesity  INTERVENTION:  Provide Magic cup TID with meals, each supplement provides 290 kcal and 9 grams of protein. Patient prefers chocolate flavor.  Will continue to monitor outcome of discussions regarding goals of care.  NUTRITION DIAGNOSIS:   Inadequate oral intake related to lethargy/confusion as evidenced by per patient/family report.  New nutrition diagnosis.  GOAL:   Patient will meet greater than or equal to 90% of their needs  Not met.  MONITOR:   PO intake, Supplement acceptance, Diet advancement, Labs, Weight trends, Skin, I & O's  REASON FOR ASSESSMENT:   Ventilator    ASSESSMENT:   66 year old male with PMHx of DM type 2, cirrhosis who presented with AMS after being found unresponsive, and also had hematemesis. Suffered PEA arrest in ED and underwent 40 minutes ACLS and was emergently intubated 9/16.  -On evening of 9/17 patient underwent ETT exchange due to air leak. -On morning of 9/18 patient was extubated. Weaned off vasopressors. -Following SLP evaluation today patient has been advanced to dysphagia 2 diet with nectar-thick liquids.  Met with patient and his family at bedside. Patient was eating some ice cream with family (likely was [employer] as patient is on nectar-thick liquids). Patient is somewhat confused but able to respond to some questions appropriately. He reports he is hungry. He enjoys ice cream. Family reports he has had no complaints of abdominal pain or nausea.   Medications reviewed and include: Novolog 0-15 units Q4hrs, Lantus 5 units QHS, pantoprazole, D5 with KCl 20 mEq/L @ 100 ml/hr (120 grams dextrose, 408 kcal daily).  Labs reviewed: CBG 96-177 past 24 hrs, Sodium 153, Chloride 121, BUN 63, Creatinine 1.33, Anion gap.   Weight now up to 129.6 kg. Will continue to monitor as it appears first weight in chart might have been stated and not true measured weight.  Discussed with  RN.  Diet Order:  DIET DYS 2 Room service appropriate? Yes with Assist; Fluid consistency: Nectar Thick  Skin:  Wound (see comment) (MSAD to abdomen)  Last BM:  08/20/2017 - small black type 7  Height:   Ht Readings from Last 1 Encounters:  05/28/17 5' 8"  (1.727 m)    Weight:   Wt Readings from Last 1 Encounters:  08/18/17 285 lb 11.5 oz (129.6 kg)    Ideal Body Weight:  70 kg (calculated with ht of 5' 8"  from previous admission)  BMI:  Body mass index is 43.44 kg/m.  Estimated Nutritional Needs:   Kcal:  2260-2465 (MSJ x 1.1-1.2)  Protein:  130-140 grams (0.9-1.1 grams/kg)  Fluid:  2.1 L/day (30 ml/kg IBW)  EDUCATION NEEDS:   No education needs identified at this time  Willey Blade, MS, RD, LDN Pager: [phone] After Hours Pager: [phone]

## 2017-08-20 NOTE — Progress Notes (Signed)
  Speech Language Pathology Treatment: Dysphagia  Patient Details Name: Jesus [name] MRN: [id number] DOB: 1950/12/18 Today's Date: 08/20/2017 Time: 1010-1100 SLP Time Calculation (min) (ACUTE ONLY): 50 min  Assessment / Plan / Recommendation Clinical Impression  Pt seen for trials to assess appropriateness for upgrade to an oral diet. Pt is much more alert and talkative today but continues to be impulsive and fidgity requiring frequent redirection to tasks (w/ oral intake) and need for mitts. Family member present to monitor.  Pt given trials of ice chips, thin liquids, Nectar liquids and purees/softened solid broken down. Immediate and delayed coughing noted w/ trials of thin liquids; min more discoordination w/ trials of thin liquids during the A-P transfer and initiation of swallowing. During trials of Nectar liquids, purees/softened solids as well as ice chips, pt exhibited better oral phase management and control of boluses(Nectar liquids) and adequate bolus mastication and oral clearing w/ trials. Pt appeared to tolerate these trial consistencies w/ no overt s/s of aspiration noted; no decline in respiratory status or O2 sats. Pt required full feeding assistance and cues to attend to task of oral intake d/t declined Cognitive status currently.  Recommend initiation of trial of diet of Dysphagia level 2 w/ Nectar liquids w/ general aspiration precautions; feeding support and cues; Pills in puree w/ NSG. ST services will f/u w/ toleration of diet and trials to upgrade consistency when appropriate - when pt's medical and Cognitive status' have improved for safe trial upgrade. MD/NSG updated and agreed. Thorough education given to family on aspiration precautions and support strategies when feeding pt during meals.    HPI HPI: [name] admitted to the ICU with PEA arrest x 2 , acute on chronic anemia with prior evaluation at the [place] with no source of blood loss seen. H/o liver cirrhosis  secondary to NASH. On this admission noted to have a possible shocked liver, on pressors, vominting blood, received blood. 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 Pt was emergently intubated at admission, extubated 08/18/17. Pt more alert today w/ speech clearer but continues to be fidgity and impulsive requiring redirection to attend.       SLP Plan  Continue with current plan of care       Recommendations  Diet recommendations: Dysphagia 1 (puree);Nectar-thick liquid Liquids provided via: Cup;Straw Medication Administration: Crushed with puree Supervision: Staff to assist with self feeding;Full supervision/cueing for compensatory strategies Compensations: Minimize environmental distractions;Slow rate;Small sips/bites;Lingual sweep for clearance of pocketing;Multiple dry swallows after each bite/sip;Follow solids with liquid Postural Changes and/or Swallow Maneuvers: Seated upright 90 degrees;Upright 30-60 min after meal                General recommendations:  (Dietician f/u) Oral Care Recommendations: Oral care BID;Staff/trained caregiver to provide oral care Follow up Recommendations: Skilled Nursing facility SLP Visit Diagnosis: Dysphagia, oropharyngeal phase (R13.12) Plan: Continue with current plan of care       GO               [name], MS, CCC-SLP Kailena Lubas 08/20/2017, 2:40 PM

## 2017-08-21 DIAGNOSIS — K7581 Nonalcoholic steatohepatitis (NASH): Secondary | ICD-10-CM

## 2017-08-21 LAB — COMPREHENSIVE METABOLIC PANEL
ALBUMIN: 2.3 g/dL — AB (ref 3.5–5.0)
ALT: 524 U/L — ABNORMAL HIGH (ref 17–63)
AST: 286 U/L — AB (ref 15–41)
Alkaline Phosphatase: 80 U/L (ref 38–126)
Anion gap: 9 (ref 5–15)
BILIRUBIN TOTAL: 10.4 mg/dL — AB (ref 0.3–1.2)
BUN: 46 mg/dL — AB (ref 6–20)
CO2: 27 mmol/L (ref 22–32)
Calcium: 7.8 mg/dL — ABNORMAL LOW (ref 8.9–10.3)
Chloride: 115 mmol/L — ABNORMAL HIGH (ref 101–111)
Creatinine, Ser: 1.06 mg/dL (ref 0.61–1.24)
GFR calc Af Amer: >60 mL/min (ref >60)
GFR calc non Af Amer: >60 mL/min (ref >60)
GLUCOSE: 257 mg/dL — AB (ref 65–99)
POTASSIUM: 4.1 mmol/L (ref 3.5–5.1)
Sodium: 151 mmol/L — ABNORMAL HIGH (ref 135–145)
TOTAL PROTEIN: 5.7 g/dL — AB (ref 6.5–8.1)

## 2017-08-21 LAB — GLUCOSE, CAPILLARY
GLUCOSE-CAPILLARY: 233 mg/dL — AB (ref 65–99)
GLUCOSE-CAPILLARY: 191 mg/dL — AB (ref 65–99)
Glucose-Capillary: 315 mg/dL — ABNORMAL HIGH (ref 65–99)
Glucose-Capillary: 255 mg/dL — ABNORMAL HIGH (ref 65–99)

## 2017-08-21 LAB — CULTURE, BLOOD (ROUTINE X 2)
CULTURE: NO GROWTH
CULTURE: NO GROWTH
Special Requests: ADEQUATE
Special Requests: ADEQUATE

## 2017-08-21 LAB — CBC
HEMATOCRIT: 32.7 % — AB (ref 40.0–52.0)
HEMOGLOBIN: 10.6 g/dL — AB (ref 13.0–18.0)
MCH: 29.2 pg (ref 26.0–34.0)
MCHC: 32.5 g/dL (ref 32.0–36.0)
MCV: 89.7 fL (ref 80.0–100.0)
Platelets: 46 10*3/uL — ABNORMAL LOW (ref 150–440)
RBC: 3.64 MIL/uL — ABNORMAL LOW (ref 4.40–5.90)
RDW: 20.5 % — AB (ref 11.5–14.5)
WBC: 5.5 10*3/uL (ref 3.8–10.6)

## 2017-08-21 LAB — AMMONIA: Ammonia: 25 umol/L (ref 9–35)

## 2017-08-21 LAB — PROTIME-INR
INR: 1.5
Prothrombin Time: 18 seconds — ABNORMAL HIGH (ref 11.4–15.2)

## 2017-08-21 MED ORDER — HALOPERIDOL LACTATE 5 MG/ML IJ SOLN
5.0000 mg | Freq: Once | INTRAMUSCULAR | Status: AC
  Administered 2017-08-21: 5 mg via INTRAMUSCULAR
  Filled 2017-08-21: qty 1

## 2017-08-21 MED ORDER — RIFAXIMIN 550 MG PO TABS
550.0000 mg | ORAL_TABLET | Freq: Two times a day (BID) | ORAL | Status: DC
  Administered 2017-08-21 – 2017-09-02 (×24): 550 mg via ORAL
  Filled 2017-08-21 (×24): qty 1

## 2017-08-21 MED ORDER — CIPROFLOXACIN HCL 500 MG PO TABS
500.0000 mg | ORAL_TABLET | Freq: Two times a day (BID) | ORAL | Status: AC
  Administered 2017-08-21 – 2017-08-23 (×4): 500 mg via ORAL
  Filled 2017-08-21 (×4): qty 1

## 2017-08-21 MED ORDER — METRONIDAZOLE 500 MG PO TABS
500.0000 mg | ORAL_TABLET | Freq: Three times a day (TID) | ORAL | Status: AC
  Administered 2017-08-21 – 2017-08-23 (×6): 500 mg via ORAL
  Filled 2017-08-21 (×6): qty 1

## 2017-08-21 MED ORDER — MEROPENEM 1 G IV SOLR
1.0000 g | Freq: Three times a day (TID) | INTRAVENOUS | Status: DC
  Filled 2017-08-21: qty 1

## 2017-08-21 MED ORDER — DEXTROSE 5 % IV SOLN
INTRAVENOUS | Status: DC
  Administered 2017-08-21 – 2017-08-22 (×2): via INTRAVENOUS

## 2017-08-21 MED ORDER — NADOLOL 20 MG PO TABS
10.0000 mg | ORAL_TABLET | Freq: Every day | ORAL | Status: DC
  Administered 2017-08-22 (×2): 10 mg via ORAL
  Filled 2017-08-21 (×2): qty 1

## 2017-08-21 MED ORDER — INSULIN GLARGINE 100 UNIT/ML ~~LOC~~ SOLN
8.0000 [IU] | Freq: Every day | SUBCUTANEOUS | Status: DC
  Administered 2017-08-22 – 2017-08-23 (×3): 8 [IU] via SUBCUTANEOUS
  Filled 2017-08-21 (×5): qty 0.08

## 2017-08-21 NOTE — Progress Notes (Signed)
  Speech Language Pathology Treatment: Dysphagia  Patient Details Name: Jesus [name] MRN: [id number] DOB: [birth year] Today's Date: 08/21/2017 Time: 1125-1200 SLP Time Calculation (min) (ACUTE ONLY): 35 min  Assessment / Plan / Recommendation Clinical Impression  Pt seen today as f/u for toleration of diet - recently initiated an oral diet of Dysphagia level 2 w/ Nectar liquids yesterday. Family present in room; Sitter. Per report, pt has consumed po's at meals w/ no overt s/s of aspiration noted; chart review of labs, systems. Currently, pt is drowsy post NSG care. Family present w/ questions.  Gave education on dysphagia in light of declined mental status; risk for aspiration and negative sequelae including respiratory decline; education given and answered questions re: diet consistency and liquid consistencies - Nectar is recommended for pt currently. Discussed food options, preparation and use of condiments to flavor and moisten. Discussed support strategies during oral intake including assessing for full alertness; reducing distractions in room to better engage pt's attention.  Recommend continue w/ current diet consistency as ordered at this time w/ consideration of trials to upgrade and/or objective assessment when pt's mental/cognitive status improves for safe assessment and intake; recommend continue pills in Puree and assistance at all meals. ST services will f/u w/ pt's status next 1-3 days. Family agreed. NSG and MD updated.   HPI HPI: [name] admitted to the ICU with PEA arrest x 2 , acute on chronic anemia with prior evaluation at the [place] with no source of blood loss seen. H/o liver cirrhosis secondary to NASH. On this admission noted to have a possible shocked liver, on pressors, vominting blood, received blood. 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 Pt was emergently intubated at admission, extubated 08/18/17. Pt more alert today w/  speech clearer but continues to be fidgity and impulsive requiring redirection to attend. Pt is tolerating the recommended dysphagia diet post eval yesterday per NSG and family report.      SLP Plan  Continue with current plan of care       Recommendations  Diet recommendations: Dysphagia 2 (fine chop);Nectar-thick liquid Liquids provided via: Cup;Straw (monitor) Medication Administration: Crushed with puree Supervision: Staff to assist with self feeding;Full supervision/cueing for compensatory strategies Compensations: Minimize environmental distractions;Slow rate;Small sips/bites;Lingual sweep for clearance of pocketing;Multiple dry swallows after each bite/sip;Follow solids with liquid Postural Changes and/or Swallow Maneuvers: Seated upright 90 degrees;Upright 30-60 min after meal                General recommendations:  (Dietician f/u) Oral Care Recommendations: Oral care BID;Staff/trained caregiver to provide oral care Follow up Recommendations: Skilled Nursing facility SLP Visit Diagnosis: Dysphagia, oropharyngeal phase (R13.12) Plan: Continue with current plan of care       GO               [name], MS, CCC-SLP [name] 08/21/2017, 12:56 PM

## 2017-08-21 NOTE — Progress Notes (Signed)
[name] MD, MRCP(U.K) [address]  [address]  [place], [place] [address]  Main: [phone]    Jesus [name] is being followed for variceal bleed, acute liver failure   Subjective: Wide awake, alert and orientated- first day when he has really woken up    Objective: Vital signs in last 24 hours: Vitals:   08/20/17 1600 08/20/17 1709 08/20/17 1955 08/21/17 0426  BP: (!) 129/101 (!) 144/88 136/82 131/68  Pulse: 100 95 95 91  Resp: Temp:   (!) 97.3 F (36.3 C) 98.6 F (37 C)  TempSrc:   Oral   SpO2: 94% 100% 99% 100%  Weight:       Weight change:   Intake/Output Summary (Last 24 hours) at 08/21/17 1042 Last data filed at 08/21/17 0930  Gross per 24 hour  Intake             3010 ml  Output             1000 ml  Net             2010 ml     Exam: Heart:: Regular rate and rhythm, S1S2 present or without murmur or extra heart sounds Lungs: normal, clear to auscultation and clear to auscultation and percussion Abdomen: soft, nontender, normal bowel sounds   Lab Results: @ Micro Results: Recent Results (from the past 240 hour(s))  Blood Culture (routine x 2)     Status: None   Collection Time: 08/16/17 12:50 AM  Result Value Ref Range Status   Specimen Description BLOOD LEFT ARM  Final   Special Requests   Final    BOTTLES DRAWN AEROBIC AND ANAEROBIC Blood Culture adequate volume   Culture NO GROWTH 5 DAYS  Final   Report Status 08/21/2017 FINAL  Final  Blood Culture (routine x 2)     Status: None   Collection Time: 08/16/17 12:51 AM  Result Value Ref Range Status   Specimen Description BLOOD LEFT ANTECUBITAL  Final   Special Requests   Final    BOTTLES DRAWN AEROBIC AND ANAEROBIC Blood Culture adequate volume   Culture NO GROWTH 5 DAYS  Final   Report Status 08/21/2017 FINAL  Final  Urine culture     Status: Abnormal   Collection Time: 08/16/17 12:51 AM  Result Value Ref Range Status   Specimen Description URINE, RANDOM   Final   Special Requests NONE  Final   Culture MULTIPLE SPECIES PRESENT, SUGGEST RECOLLECTION (A)  Final   Report Status 08/17/2017 FINAL  Final  MRSA PCR Screening     Status: None   Collection Time: 08/16/17  4:56 AM  Result Value Ref Range Status   MRSA by PCR NEGATIVE NEGATIVE Final    Comment:        The GeneXpert MRSA Assay (FDA approved for NASAL specimens only), is one component of a comprehensive MRSA colonization surveillance program. It is not intended to diagnose MRSA infection nor to guide or monitor treatment for MRSA infections.    Studies/Results: No results found. Medications: I have reviewed the patient's current medications. Scheduled Meds: . insulin aspart  0-15 Units Subcutaneous Q4H  . insulin glargine  5 Units Subcutaneous QHS  . pantoprazole (PROTONIX) IV  40 mg Intravenous Q12H  . sodium chloride flush  10-40 mL Intracatheter Q12H   Continuous Infusions: . dextrose 5 % with KCl 20 mEq / L 20 mEq (08/21/17 0600)  . meropenem (MERREM) IV Stopped (  08/21/17 0340)   PRN Meds:.haloperidol lactate, hydrALAZINE, LORazepam, [DISCONTINUED] ondansetron **OR** ondansetron (ZOFRAN) IV, sodium chloride flush   Assessment: Active Problems:   Cardiac arrest (HCC)   Hemorrhagic shock (HCC)   AKI (acute kidney injury) (HCC)   Hepatic cirrhosis (HCC)   Liver cirrhosis secondary to NASH [facility])   Hematemesis   Palliative care encounter   DNR (do not resuscitate) discussion   Acute blood loss anemia   Coagulopathy (HCC)   Acute encephalopathy   DNR (do not resuscitate)   CBC Latest Ref Rng & Units 08/21/2017 08/20/2017 08/19/2017  WBC 3.8 - 10.6 K/uL 5.5 6.7 6.8  Hemoglobin 13.0 - 18.0 g/dL 10.6(L) 9.9(L) 9.8(L)  Hematocrit 40.0 - 52.0 % 32.7(L) 29.5(L) 29.2(L)  Platelets 150 - 440 K/uL 46(L) 42(L) 46(L)    Jesus [name] [phone] y.o.maleadmitted to the ICU with PEA arrest x 2 , acute on chronic anemia with prior evaluation at the [facility] with no source of blood loss  seen. H/o liver cirrhosis . On this admission noted to have a possible shocked liver, on pressors , received blood. 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 Variceal bleed- stable, Can feed orally  . Repeat EGD as outpatient in 2-3 weeks for eradication of all varices. Change to  nadolol 20 mg closer to discharge . Black stool in rectal tube likely old stool. I expect it to start clearing up in a day or two . Hb has been stable   2. Acute liver failure- likely from shocked liver ,INR stable . Bilirubin can rise for a few days after the acute event and then start dropiing. Commence on Lactulose and Xifaxan for hepatic encephelopathy low sodium diet . Titrate lactulose to two soft bowel movements a day .       LOS: 5 days   [name] 08/21/2017, 10:42 AM

## 2017-08-21 NOTE — Progress Notes (Signed)
Inpatient Diabetes Program Recommendations  AACE/ADA: New Consensus Statement on Inpatient Glycemic Control (2015)  Target Ranges:  Prepandial:   less than 140 mg/dL      Peak postprandial:   less than 180 mg/dL (1-2 hours)      Critically ill patients:  140 - 180 mg/dL   Lab Results  Component Value Date   GLUCAP 191 (H) 08/21/2017   HGBA1C 9.9 (H) 05/28/2017    Review of Glycemic Control  Results for Jesus [name], Jesus [name] (MRN [id number]) as of 08/21/2017 10:43  Ref. Range 08/20/2017 16:25 08/20/2017 20:27 08/20/2017 23:14 08/21/2017 04:20 08/21/2017 07:43  Glucose-Capillary Latest Ref Range: 65 - 99 mg/dL 865 (H) 784 (H) 696 (H) 233 (H) 191 (H)    Diabetes history:DM2 Outpatient Diabetes medications: Lantus 34 units daily, Novolog 7 units TID with meals, Metformin 1000 mg BID, Glipizide 5 mg daily Current orders for Inpatient glycemic control: Lantus 5 units qhs, Novolog 0-15 units q4h  Inpatient Diabetes Program Recommendations:Consider increasing Lantus to 7 units qhs - CBG beginning to rise.  Susette Racer, RN, BA, MHA, CDE Diabetes Coordinator Inpatient Diabetes Program  [phone] (Team Pager) [phone] [facility] Office) 08/21/2017 10:48 AM

## 2017-08-21 NOTE — Care Management (Signed)
Moved out of icu within last 24 hours.  Patient has a Recruitment consultant. If discharges to a skilled nursing facility, must be without a sitter for 24 hours.  He is requiring mitts to prevent pulling out lines.  Patient is not able to tell CM that he is in the hospital. At present, it has been recommended to transition to skilled nursing when medically stable.  This recommendation could change as patient's condition improves.  Obtained order for occupational therapy which will be needed for insurance auth for skilled nursing

## 2017-08-21 NOTE — Progress Notes (Signed)
Pharmacy Antibiotic Note  Jesus [name] is a 66 y.o. male admitted on 08/16/2017 with sepsis.  Pharmacy has been consulted for meropenem dosing.   Plan: Renal function has improved. Will adjust dose to meropenem 1g IV every 8 hours. Total days of meropenem therapy should be 7 days per Dr. Sung Amabile.    Weight: 285 lb 11.5 oz (129.6 kg)  Temp (24hrs), Avg:98.1 F (36.7 C), Min:97.3 F (36.3 C), Max:98.6 F (37 C)   Recent Labs Lab 08/16/17 0051 08/16/17 0422  08/17/17 0555  08/18/17 0405 08/19/17 0322 08/19/17 1257 08/20/17 0438 08/21/17 0337  WBC  --   --   < > 10.0  < > 7.8 4.0 6.8 6.7 5.5  CREATININE  --   --   < > 2.30*  --  2.01* 1.58*  --  1.33* 1.06  LATICACIDVEN 21.9* 18.6*  --  4.1*  --   --   --   --   --   --   < > = values in this interval not displayed.  Estimated Creatinine Clearance: 90.1 mL/min (by C-G formula based on SCr of 1.06 mg/dL).    Allergies  Allergen Reactions  . Penicillins Hives and Other (See Comments)    Has patient had a PCN reaction causing immediate rash, facial/tongue/throat swelling, SOB or lightheadedness with hypotension: Yes Has patient had a PCN reaction causing severe rash involving mucus membranes or skin necrosis: No Has patient had a PCN reaction that required hospitalization: No Has patient had a PCN reaction occurring within the last 10 years: No If all of the above answers are "NO", then may proceed with Cephalosporin use.    ABX: 9/16 aztreonam x 1 9/16 Levaquin x 1 9/16 meropenem >>  9/16 vancomycin x 1  Micro:  9/16 MRSA PCR: negative 9/16 BCx: NGTD UCx: multiple species  Thank you for allowing pharmacy to be a part of this patient's care.  Gardner Candle, PharmD, BCPS Clinical Pharmacist 08/21/2017 11:23 AM

## 2017-08-21 NOTE — Progress Notes (Addendum)
Patient ID: Jesus [name], male   DOB: [birth year], 66 y.o.   MRN: [id number]   [employer] PROGRESS NOTE  SHREYAN HINZ WUJ:[id number] DOB: 1951/04/18 DOA: 08/16/2017 PCP: System, Pcp Not In  HPI/Subjective: Patient is a little more talkative and able to answer questions will bit better today than yesterday. Stated he had eggs and bacon and sausage for breakfast. As per the sitter the patient did not have bacon and sausage for breakfast. No complaints of any pain.  Objective: Vitals:   08/21/17 0426 08/21/17 1424  BP: 131/68 135/72  Pulse: 91 (!) 102  Resp: 20   Temp: 98.6 F (37 C) 98.4 F (36.9 C)  SpO2: 100% 96%    Filed Weights   08/16/17 0045 08/18/17 0247  Weight: 123.4 kg (272 lb) 129.6 kg (285 lb 11.5 oz)    ROS: Review of Systems  Unable to perform ROS: Acuity of condition  Respiratory: Negative for shortness of breath.   Cardiovascular: Negative for chest pain.  Gastrointestinal: Positive for diarrhea. Negative for abdominal pain, nausea and vomiting.  Musculoskeletal: Negative for joint pain.   Exam: Physical Exam  Eyes: Pupils are equal, round, and reactive to light. Lids are normal.  icteric  Neck: No JVD present. Carotid bruit is not present. No edema present. No thyroid mass and no thyromegaly present.  Cardiovascular: S1 normal and S2 normal.  Tachycardia present.  Exam reveals no gallop.   Murmur heard.  Systolic murmur is present with a grade of 3/6  Pulses:      Dorsalis pedis pulses are 1+ on the right side, and 1+ on the left side.  Respiratory: No respiratory distress. He has decreased breath sounds in the right lower field and the left lower field. He has no wheezes. He has rhonchi in the right lower field and the left lower field. He has no rales.  GI: Soft. Bowel sounds are normal. There is no tenderness.  Musculoskeletal:       Right ankle: He exhibits swelling.       Left ankle: He exhibits swelling.  Lymphadenopathy:    He has no  cervical adenopathy.  Neurological: He is alert.  Skin: Skin is warm. Nails show no clubbing.  Chronic lower extremity discoloration on bilateral shins.  Psychiatric: His speech is delayed.  Patient able to answer questions better today      Data Reviewed: Basic Metabolic Panel:  Recent Labs Lab 08/16/17 0456  08/17/17 0011 08/17/17 0555 08/18/17 0405 08/19/17 0322 08/20/17 0438 08/21/17 0337  NA 143  < > 144 143 146* 150* 153* 151*  K 4.3  < > 3.5 3.9 3.3* 3.3* 4.0 4.1  CL 105  < > 110 112* 115* 119* 121* 115*  CO2 11*  < > GLUCOSE 649*  < > 178* 228* 264* 201* 136* 257*  BUN 89*  < > 97* 96* 95* 81* 63* 46*  CREATININE 2.29*  < > 2.33* 2.30* 2.01* 1.58* 1.33* 1.06  CALCIUM 8.7*  < > 7.8* 7.6* 7.4* 7.0* 7.9* 7.8*  MG 2.0  --  1.4* 1.3* 2.2  --   --   --   PHOS 7.8*  --   --  2.6  --   --   --   --   < > = values in this interval not displayed. Liver Function Tests:  Recent Labs Lab 08/16/17 0456 08/18/17 0405 08/19/17 0322 08/20/17 0438 08/21/17 0337  AST 618* 582*  714* 669* 286*  ALT 392* 596* 674* 756* 524*  ALKPHOS 109 68 56 71 80  BILITOT 1.3* 3.5* 4.6* 7.8* 10.4*  PROT 4.9* 4.8* 4.5* 5.7* 5.7*  ALBUMIN 2.2* 2.3* 1.9* 2.4* 2.3*    Recent Labs Lab 08/16/17 0456  LIPASE 33  AMYLASE 145*    Recent Labs Lab 08/16/17 0051 08/16/17 1058 08/17/17 2129 08/21/17 0337  AMMONIA 177* 325* 78* 25   CBC:  Recent Labs Lab 08/16/17 0049  08/18/17 0405 08/19/17 0322 08/19/17 1257 08/20/17 0438 08/21/17 0337  WBC 19.0*  < > 7.8 4.0 6.8 6.7 5.5  NEUTROABS 15.9*  --  5.2 2.5  --   --   --   HGB 5.8*  < > 7.4* 4.5* 9.8* 9.9* 10.6*  HCT 20.5*  < > 22.2* 13.8* 29.2* 29.5* 32.7*  MCV 99.6  < > 86.2 88.8 85.8 86.6 89.7  PLT 174  < > 43* 30* 46* 42* 46*  < > = values in this interval not displayed. Cardiac Enzymes:  Recent Labs Lab 08/16/17 0049 08/16/17 0456 08/16/17 0950 08/16/17 1745 08/17/17 1449  CKTOTAL 171  --   --   --    --   TROPONINI 0.06* 0.45* 6.53* 16.81* 5.41*    CBG:  Recent Labs Lab 08/20/17 2027 08/20/17 2314 08/21/17 0420 08/21/17 0743 08/21/17 1148  GLUCAP 157* 164* 233* 191* 315*    Recent Results (from the past 240 hour(s))  Blood Culture (routine x 2)     Status: None   Collection Time: 08/16/17 12:50 AM  Result Value Ref Range Status   Specimen Description BLOOD LEFT ARM  Final   Special Requests   Final    BOTTLES DRAWN AEROBIC AND ANAEROBIC Blood Culture adequate volume   Culture NO GROWTH 5 DAYS  Final   Report Status 08/21/2017 FINAL  Final  Blood Culture (routine x 2)     Status: None   Collection Time: 08/16/17 12:51 AM  Result Value Ref Range Status   Specimen Description BLOOD LEFT ANTECUBITAL  Final   Special Requests   Final    BOTTLES DRAWN AEROBIC AND ANAEROBIC Blood Culture adequate volume   Culture NO GROWTH 5 DAYS  Final   Report Status 08/21/2017 FINAL  Final  Urine culture     Status: Abnormal   Collection Time: 08/16/17 12:51 AM  Result Value Ref Range Status   Specimen Description URINE, RANDOM  Final   Special Requests NONE  Final   Culture MULTIPLE SPECIES PRESENT, SUGGEST RECOLLECTION (A)  Final   Report Status 08/17/2017 FINAL  Final  MRSA PCR Screening     Status: None   Collection Time: 08/16/17  4:56 AM  Result Value Ref Range Status   MRSA by PCR NEGATIVE NEGATIVE Final    Comment:        The GeneXpert MRSA Assay (FDA approved for NASAL specimens only), is one component of a comprehensive MRSA colonization surveillance program. It is not intended to diagnose MRSA infection nor to guide or monitor treatment for MRSA infections.      Scheduled Meds: . ciprofloxacin  500 mg Oral BID  . insulin aspart  0-15 Units Subcutaneous Q4H  . insulin glargine  8 Units Subcutaneous QHS  . metroNIDAZOLE  500 mg Oral Q8H  . pantoprazole (PROTONIX) IV  40 mg Intravenous Q12H  . rifaximin  550 mg Oral BID  . sodium chloride flush  10-40 mL  Intracatheter Q12H   Continuous Infusions: . dextrose 50  mL/hr at 08/21/17 1422    Assessment/Plan:  1. Cardiac arrest. Likely secondary to respiratory arrest and/or GI bleed. 2. Hemorrhagic shock versus septic shock. Patient off all pressors at this point. Empiric antibiotics with meropenem, switched oral Cipro and Flagyl. Cultures are negative. Endoscopy showed angiectasias that were cauterized.  Critical care specialist stopped octreotide drip. Continue Protonix.  Patient transfused 5 units of pack red blood cells during hospital course.  Trying to start low dose nadolol.  As per GI the black stool can be residual blood that's in his system from what he saw in his stomach when he did the endoscopy. 3. Acute hypoxic respiratory failure. Extubated 08/18/2017.  Now on room air. 4. Metabolic acidosis and lactic acidosis secondary to respiratory failure, shock. 5. Severe anemia. Patient received 5 units of pack red blood cells during the hospital course.  Hemoglobin today is 10.6. 6. Acute kidney injury.  Improved and last creatinine 1.06 7. Hypernatremia and patient started on D5W. sodium 151 today. Decrease rate of D5W down to 50 mL per hour. Patient eating better. 8. Hypokalemia replaced and IV fluids. Now since patient is eating stopped the potassium in the IV fluids. 9. Elevated liver function tests likely secondary to cardiac arrest and shock liver. 10. Liver cirrhosis seen on ultrasound.  As per family this is known from the past. 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  Patient very slow with his answers.  Advised the family that this could take a while to improve. Seems to be improving daily. 12. Hepatic encephalopathy as per GI can restart Xifaxan. Hold off on lactulose since the patient does have a rectal tube in and black stool. 13. Empiric Vitamin K given for high INR.  14. Elevated troponin of 16.81 likely demand ischemia secondary to shock. Limited with a GI bleed and unable to give any  anticoagulation. Low dose nadolol. 15. Not a candidate for LTAC at this point. 16. Type 2 diabetes mellitus increase Lantus dosing. Hopefully can get off D5W drip soon.  Code Status:     Code Status Orders        Start     Ordered   08/16/17 0405  Full code  Continuous     08/16/17 0404    Code Status History    Date Active Date Inactive Code Status Order ID Comments User Context   05/28/2017  5:15 AM 06/04/2017  8:10 PM Full Code [id number]  Arnaldo Natal, MD Inpatient     Family Communication: Spoke with 2 daughters and wife at the bedside Disposition Plan: Patient will likely need long-term placement   Consultants:  Critical care specialist  gastroenterology  Procedures:  Upper endoscopy  Antibiotics:  meropenem  Time spent: 30 minutes  Alford Highland  [employer]

## 2017-08-21 NOTE — Progress Notes (Signed)
Physical Therapy Treatment Patient Details Name: Jesus [name] MRN: [id number] DOB: 1951/04/03 Today's Date: 08/21/2017    History of Present Illness Pt admitted for cardiac arrest with complaints of AMS and hematemesis. PMH of cirrhosis, chronic iron deficiency, DM, hyperglycemia, cardiac arrest. Pt had two episodes of cardiac arrest in ED, went through 2 rounds of CPR, intubated from 9/16-9/18. Had severe GI bleed post EGD on 9/17, required multiple transfusions. Has been confused and agitated, has mitts on hands and has been on Haldol.      PT Comments    Pt is making progress towards goals. Pt able to follow commands with cues to perform supine there-ex with mod assist and bed mobility with max assist. Pt able to initiate rolling to both sides but requires cues for proper sequencing and is unable to shift weight and completely roll onto side. Pt demonstrates deficits with UE/LE strength and endurance which limits pt's ability to perform bed mobility/transfers/amb, and requires continuous cues for all movements. Pt appeared sleepy but able to respond to simple questions and was cooperative to perform PT activities.    Follow Up Recommendations  SNF     Equipment Recommendations  Rolling walker with 5" wheels    Recommendations for Other Services       Precautions / Restrictions Precautions Precautions: Fall Restrictions Weight Bearing Restrictions: No    Mobility  Bed Mobility Overal bed mobility: Needs Assistance Bed Mobility: Rolling Rolling: Max assist         General bed mobility comments: Pt able to initiate rolling on both sides by pushing heels into bed, requires cues to reach arm over and roll. Pt unable to utilize bed rails due to mitts on hands. Pt able to lift shoulder off of bed but unable to completely transfer weight to the side. Pt able to roll greater on R side than L. Pt performed rolling two times on each side.   Transfers                 General  transfer comment: Deferred  Ambulation/Gait             General Gait Details: Deferred   Stairs            Wheelchair Mobility    Modified Rankin (Stroke Patients Only)       Balance                                            Cognition Arousal/Alertness: Awake/alert;Lethargic Behavior During Therapy: WFL for tasks assessed/performed Overall Cognitive Status: Within Functional Limits for tasks assessed                                        Exercises Other Exercises Other Exercises: Supine ther-ex performed both sides, 5x shoulder raises, 5x ankle pumps, 10x SLRs with mod assist, 10x heel slides, 10x hip abd/add. Pt able to initiate movement, however performs within a small ROM, pt fatigues towards last few reps, requires tactile and verbal cues.  Other Exercises: LE MMT grossly 3/5    General Comments        Pertinent Vitals/Pain Pain Assessment: No/denies pain Faces Pain Scale: No hurt    Home Living  Prior Function            PT Goals (current goals can now be found in the care plan section) Acute Rehab PT Goals Patient Stated Goal: to get better PT Goal Formulation: With patient Progress towards PT goals: Progressing toward goals    Frequency    Min 2X/week      PT Plan Current plan remains appropriate    Co-evaluation              AM-PAC PT "6 Clicks" Daily Activity  Outcome Measure  Difficulty turning over in bed (including adjusting bedclothes, sheets and blankets)?: Unable Difficulty moving from lying on back to sitting on the side of the bed? : Unable Difficulty sitting down on and standing up from a chair with arms (e.g., wheelchair, bedside commode, etc,.)?: Unable Help needed moving to and from a bed to chair (including a wheelchair)?: Total Help needed walking in hospital room?: Total Help needed climbing 3-5 steps with a railing? : Total 6 Click Score:  6    End of Session   Activity Tolerance: Patient tolerated treatment well Patient left: in bed;with call bell/phone within reach;with bed alarm set;with nursing/sitter in room;with family/visitor present   PT Visit Diagnosis: Other abnormalities of gait and mobility (R26.89);Muscle weakness (generalized) (M62.81)     Time: 1125-1140 PT Time Calculation (min) (ACUTE ONLY): 15 min  Charges:                       G Codes:  Functional Assessment Tool Used: AM-PAC 6 Clicks Basic Mobility Functional Limitation: Mobility: Walking and moving around Mobility: Walking and Moving Around Current Status (Z6109): 100 percent impaired, limited or restricted Mobility: Walking and Moving Around Goal Status (U0454): At least 80 percent but less than 100 percent impaired, limited or restricted    Renford Dills, SPT   Erle Crocker Nyella Eckels 08/21/2017, 1:16 PM

## 2017-08-21 NOTE — Plan of Care (Signed)
Problem: SLP Dysphagia Goals Goal: Misc Dysphagia Goal Pt will safely tolerate po diet of least restrictive consistency w/ no overt s/s of aspiration noted by Staff/pt/family x3 sessions.    

## 2017-08-21 NOTE — Care Management Important Message (Signed)
Important Message  Patient Details  Name: Jesus [name] MRN: [id number] Date of Birth: 13-Aug-1951   Medicare Important Message Given:  Yes Signed IM notice given   Eber Hong, RN 08/21/2017, 8:43 AM

## 2017-08-21 NOTE — Plan of Care (Signed)
Problem: Safety: Goal: Ability to remain free from injury will improve Outcome: Not Progressing Pt is very agitated, getting OOB, MD notified and safety sitter implemented

## 2017-08-22 LAB — CBC
HCT: 32 % — ABNORMAL LOW (ref 40.0–52.0)
Hemoglobin: 10.5 g/dL — ABNORMAL LOW (ref 13.0–18.0)
MCH: 29.1 pg (ref 26.0–34.0)
MCHC: 32.7 g/dL (ref 32.0–36.0)
MCV: 88.9 fL (ref 80.0–100.0)
PLATELETS: 65 10*3/uL — AB (ref 150–440)
RBC: 3.6 MIL/uL — AB (ref 4.40–5.90)
RDW: 21.6 % — ABNORMAL HIGH (ref 11.5–14.5)
WBC: 8.6 10*3/uL (ref 3.8–10.6)

## 2017-08-22 LAB — BLOOD GAS, ARTERIAL
Acid-Base Excess: 0.7 mmol/L (ref 0.0–2.0)
Bicarbonate: 25.4 mmol/L (ref 20.0–28.0)
FIO2: 0.4
MECHVT: 500 mL
O2 Saturation: 97.4 %
PEEP: 5 cmH2O
PH ART: 7.41 (ref 7.350–7.450)
Patient temperature: 37
RATE: 24 resp/min
pCO2 arterial: 40 mmHg (ref 32.0–48.0)
pO2, Arterial: 94 mmHg (ref 83.0–108.0)

## 2017-08-22 LAB — BASIC METABOLIC PANEL
Anion gap: 4 — ABNORMAL LOW (ref 5–15)
BUN: 33 mg/dL — ABNORMAL HIGH (ref 6–20)
CALCIUM: 7.9 mg/dL — AB (ref 8.9–10.3)
CHLORIDE: 118 mmol/L — AB (ref 101–111)
CO2: 27 mmol/L (ref 22–32)
CREATININE: 0.98 mg/dL (ref 0.61–1.24)
GFR calc non Af Amer: >60 mL/min (ref >60)
Glucose, Bld: 195 mg/dL — ABNORMAL HIGH (ref 65–99)
Potassium: 4.1 mmol/L (ref 3.5–5.1)
Sodium: 149 mmol/L — ABNORMAL HIGH (ref 135–145)

## 2017-08-22 LAB — GLUCOSE, CAPILLARY
GLUCOSE-CAPILLARY: 211 mg/dL — AB (ref 65–99)
GLUCOSE-CAPILLARY: 285 mg/dL — AB (ref 65–99)
Glucose-Capillary: 196 mg/dL — ABNORMAL HIGH (ref 65–99)
Glucose-Capillary: 239 mg/dL — ABNORMAL HIGH (ref 65–99)
Glucose-Capillary: 329 mg/dL — ABNORMAL HIGH (ref 65–99)
Glucose-Capillary: 177 mg/dL — ABNORMAL HIGH (ref 65–99)
Glucose-Capillary: 208 mg/dL — ABNORMAL HIGH (ref 65–99)

## 2017-08-22 MED ORDER — INSULIN ASPART 100 UNIT/ML ~~LOC~~ SOLN
0.0000 [IU] | Freq: Every day | SUBCUTANEOUS | Status: DC
  Administered 2017-08-22: 23:00:00 3 [IU] via SUBCUTANEOUS
  Administered 2017-08-23: 2 [IU] via SUBCUTANEOUS
  Administered 2017-08-24: 20:00:00 4 [IU] via SUBCUTANEOUS
  Administered 2017-08-25: 2 [IU] via SUBCUTANEOUS
  Administered 2017-08-26: 3 [IU] via SUBCUTANEOUS
  Administered 2017-08-27: 23:00:00 2 [IU] via SUBCUTANEOUS
  Administered 2017-08-28: 3 [IU] via SUBCUTANEOUS
  Administered 2017-08-29: 22:00:00 2 [IU] via SUBCUTANEOUS
  Administered 2017-08-30 – 2017-09-01 (×3): 3 [IU] via SUBCUTANEOUS
  Filled 2017-08-22 (×12): qty 1

## 2017-08-22 MED ORDER — INSULIN ASPART 100 UNIT/ML ~~LOC~~ SOLN
0.0000 [IU] | Freq: Three times a day (TID) | SUBCUTANEOUS | Status: DC
  Administered 2017-08-23: 5 [IU] via SUBCUTANEOUS
  Administered 2017-08-23 (×2): 3 [IU] via SUBCUTANEOUS
  Administered 2017-08-24: 08:00:00 2 [IU] via SUBCUTANEOUS
  Administered 2017-08-24 (×2): 5 [IU] via SUBCUTANEOUS
  Administered 2017-08-25: 2 [IU] via SUBCUTANEOUS
  Administered 2017-08-25 (×2): 3 [IU] via SUBCUTANEOUS
  Administered 2017-08-26: 12:00:00 2 [IU] via SUBCUTANEOUS
  Administered 2017-08-26: 17:00:00 3 [IU] via SUBCUTANEOUS
  Administered 2017-08-26: 08:00:00 1 [IU] via SUBCUTANEOUS
  Administered 2017-08-27: 3 [IU] via SUBCUTANEOUS
  Administered 2017-08-27: 5 [IU] via SUBCUTANEOUS
  Administered 2017-08-27: 3 [IU] via SUBCUTANEOUS
  Administered 2017-08-28 (×2): 5 [IU] via SUBCUTANEOUS
  Administered 2017-08-28: 08:00:00 2 [IU] via SUBCUTANEOUS
  Administered 2017-08-29: 5 [IU] via SUBCUTANEOUS
  Administered 2017-08-29: 3 [IU] via SUBCUTANEOUS
  Administered 2017-08-29: 1 [IU] via SUBCUTANEOUS
  Administered 2017-08-30: 17:00:00 5 [IU] via SUBCUTANEOUS
  Administered 2017-08-30: 7 [IU] via SUBCUTANEOUS
  Administered 2017-08-30: 5 [IU] via SUBCUTANEOUS
  Administered 2017-08-31: 2 [IU] via SUBCUTANEOUS
  Administered 2017-08-31 – 2017-09-01 (×5): 5 [IU] via SUBCUTANEOUS
  Administered 2017-09-02 (×2): 3 [IU] via SUBCUTANEOUS
  Filled 2017-08-22 (×31): qty 1

## 2017-08-22 MED ORDER — SPIRONOLACTONE 25 MG PO TABS
25.0000 mg | ORAL_TABLET | Freq: Every day | ORAL | Status: DC
  Administered 2017-08-22 – 2017-08-30 (×9): 25 mg via ORAL
  Filled 2017-08-22 (×9): qty 1

## 2017-08-22 MED ORDER — FUROSEMIDE 40 MG PO TABS
40.0000 mg | ORAL_TABLET | Freq: Every day | ORAL | Status: DC
  Administered 2017-08-22 – 2017-08-23 (×2): 40 mg via ORAL
  Filled 2017-08-22 (×2): qty 1

## 2017-08-22 MED ORDER — SODIUM CHLORIDE 0.9% FLUSH
3.0000 mL | Freq: Two times a day (BID) | INTRAVENOUS | Status: DC
  Administered 2017-08-22 – 2017-09-02 (×24): 3 mL via INTRAVENOUS

## 2017-08-22 NOTE — Progress Notes (Signed)
More alert, oriented to current; limited memory recall, impulsive. Continues to pick at lines, skin. Requires 1:1 sitter for safety. Continues to void incontinent with incontinent stooling even with flexseal rectal fecal collection system. Eating well. IVF"s discontinued with restart of diuretics. 3-4+ generalized edema with some weeping. Methiculous skin care performed in abdominal folds, groin, and perineal area. Mouthcare performed frequently with family involved. Pt dangled for approximately 5 mintues, but had to return to sitting due to impulsiveness. No signs bleeding; continued jaundices. External male urine system also used on pt.

## 2017-08-22 NOTE — Progress Notes (Signed)
  Speech Language Pathology Treatment:    Patient Details Name: Jesus [name] MRN: [id number] DOB: 1951-01-07 Today's Date: 08/22/2017    Assessment / Plan / Recommendation Clinical Impression  SLP consulted with NSG who states pt is stable at this time. No new ssx aspiration or difficulties noted this date. NSG will contact SLP today if changes occur. Continue with current recommended diet.   HPI        SLP Plan   Continue with current diet and plan       Recommendations   Diet and status to remain at this time.               Meredith Pel Sauber 08/22/2017, 10:24 AM

## 2017-08-22 NOTE — Progress Notes (Signed)
Pt has had no further bleeding since this a.m. Dgt and Dennie Bible, other family member updated.

## 2017-08-22 NOTE — [place] FL2 (Signed)
[place] MEDICAID FL2 LEVEL OF CARE SCREENING TOOL     IDENTIFICATION  Patient Name: Jesus [name] Birthdate: 03-11-1951 Sex: male Admission Date (Current Location): 08/16/2017  [place] and [place] Number:  Chiropodist and Address:  [facility], [address], [place], [place] [address]      Provider Number: [id number]  Attending Physician Name and Address:  Enid Baas, MD  Relative Name and Phone Number:  Lemar Lofty (daughter) [phone]    Current Level of Care: Hospital Recommended Level of Care: Skilled Nursing Facility Prior Approval Number:    Date Approved/Denied:   PASRR Number: [id number]  Discharge Plan: SNF    Current Diagnoses: Patient Active Problem List   Diagnosis Date Noted  . Acute blood loss anemia   . Coagulopathy (HCC)   . Acute encephalopathy   . DNR (do not resuscitate)   . Hemorrhagic shock (HCC)   . AKI (acute kidney injury) (HCC)   . Hepatic cirrhosis (HCC)   . Liver cirrhosis secondary to NASH (HCC)   . Hematemesis   . Palliative care encounter   . DNR (do not resuscitate) discussion   . Cardiac arrest (HCC) 08/16/2017  . 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 Sepsis (HCC) 05/28/2017    Orientation RESPIRATION BLADDER Height & Weight     Self  Normal Incontinent, External catheter Weight: (!) 302 lb 0.5 oz (137 kg) Height:   (165.1 cm)  BEHAVIORAL SYMPTOMS/MOOD NEUROLOGICAL BOWEL NUTRITION STATUS      Incontinent Diet (Heart healthy; carb modified)  AMBULATORY STATUS COMMUNICATION OF NEEDS Skin   Extensive Assist Verbally Normal                       Personal Care Assistance Level of Assistance  Bathing, Feeding, Dressing Bathing Assistance: Maximum assistance Feeding assistance: Independent Dressing Assistance: Maximum assistance     Functional Limitations Info             SPECIAL CARE FACTORS FREQUENCY  PT (By licensed PT), OT (By  licensed OT)     PT Frequency: Up to 5X per day, 5 days per week OT Frequency: Up to 5X per day, 5 days per week            Contractures Contractures Info: Not present    Additional Factors Info  Allergies, Code Status, Psychotropic Code Status Info: Penicillins Allergies Info: DNR Psychotropic Info: Vistaril, Lexapro         Current Medications (08/22/2017):  This is the current hospital active medication list Current Facility-Administered Medications  Medication Dose Route Frequency Provider Last Rate Last Dose  . ciprofloxacin (CIPRO) tablet 500 mg  500 mg Oral BID Alford Highland, MD   500 mg at 08/22/17 0758  . furosemide (LASIX) tablet 40 mg  40 mg Oral Daily Enid Baas, MD   40 mg at 08/22/17 1315  . haloperidol lactate (HALDOL) injection 1-2 mg  1-2 mg Intravenous Q3H PRN Merwyn Katos, MD   2 mg at 08/20/17 1136  . hydrALAZINE (APRESOLINE) injection 10-20 mg  10-20 mg Intravenous Q4H PRN Eugenie Norrie, NP   10 mg at 08/19/17 1323  . insulin glargine (LANTUS) injection 8 Units  8 Units Subcutaneous QHS Alford Highland, MD   8 Units at 08/22/17 0038  . LORazepam (ATIVAN) injection 0.5-1 mg  0.5-1 mg Intravenous Q4H PRN Eugenie Norrie, NP   1 mg at 08/20/17 0124  . metroNIDAZOLE (FLAGYL) tablet  500 mg  500 mg Oral Q8H Wieting, Richard, MD   500 mg at 08/22/17 1430  . nadolol (CORGARD) tablet 10 mg  10 mg Oral QHS Alford Highland, MD   10 mg at 08/22/17 0038  . ondansetron (ZOFRAN) injection 4 mg  4 mg Intravenous Q6H PRN Ihor Austin, MD   4 mg at 08/16/17 1937  . pantoprazole (PROTONIX) injection 40 mg  40 mg Intravenous Q12H Merwyn Katos, MD   40 mg at 08/22/17 0800  . rifaximin (XIFAXAN) tablet 550 mg  550 mg Oral BID Alford Highland, MD   550 mg at 08/22/17 0758  . sodium chloride flush (NS) 0.9 % injection 10-40 mL  10-40 mL Intracatheter Q12H Tukov, Magadalene S, NP   3 mL at 08/22/17 1315  . sodium chloride flush (NS) 0.9 % injection 10-40 mL   10-40 mL Intracatheter PRN Marylou Flesher S, NP      . spironolactone (ALDACTONE) tablet 25 mg  25 mg Oral Daily Enid Baas, MD   25 mg at 08/22/17 1315     Discharge Medications: Please see discharge summary for a list of discharge medications.  Relevant Imaging Results:  Relevant Lab Results:   Additional Information SS# [id number]  Judi Cong, LCSW

## 2017-08-22 NOTE — Progress Notes (Signed)
[employer] [facility] [employer] at [facility]   PATIENT NAME: Jesus [name]    MR#:  [id number]  DATE OF BIRTH:  [birth year]  SUBJECTIVE:  CHIEF COMPLAINT:   Chief Complaint  Patient presents with  . Altered Mental Status  . Hyperglycemia   - more alert and communicating  This morning. -Anasarca noted. -Complains of being extremely weak  REVIEW OF SYSTEMS:  Review of Systems  Constitutional: Positive for malaise/fatigue. Negative for chills and fever.  HENT: Negative for congestion, ear discharge, hearing loss and nosebleeds.   Respiratory: Negative for cough, shortness of breath and wheezing.   Cardiovascular: Positive for leg swelling. Negative for chest pain and palpitations.  Gastrointestinal: Negative for abdominal pain, constipation, diarrhea, nausea and vomiting.  Genitourinary: Negative for dysuria.  Musculoskeletal: Negative for myalgias.  Neurological: Negative for dizziness, speech change, focal weakness, seizures and headaches.  Psychiatric/Behavioral: Negative for depression.    DRUG ALLERGIES:   Allergies  Allergen Reactions  . Penicillins Hives and Other (See Comments)    Has patient had a PCN reaction causing immediate rash, facial/tongue/throat swelling, SOB or lightheadedness with hypotension: Yes Has patient had a PCN reaction causing severe rash involving mucus membranes or skin necrosis: No Has patient had a PCN reaction that required hospitalization: No Has patient had a PCN reaction occurring within the last 10 years: No If all of the above answers are "NO", then may proceed with Cephalosporin use.     VITALS:  Blood pressure 138/77, pulse 88, temperature 98.6 F (37 C), temperature source Oral, resp. rate 20, height  (1.651 m), weight (!) 137 kg (302 lb 0.5 oz), SpO2 96 %.  PHYSICAL EXAMINATION:  Physical Exam  GENERAL:  66 y.o.-year-old obese patient lying in the bed with no acute distress.  EYES: Pupils equal, round, reactive  to light and accommodation. No scleral icterus. Extraocular muscles intact.  HEENT: Head atraumatic, normocephalic. Oropharynx and nasopharynx clear.  NECK:  Supple, no jugular venous distention. No thyroid enlargement, no tenderness.  LUNGS: Normal breath sounds bilaterally, no wheezing, rales,rhonchi or crepitation. No use of accessory muscles of respiration.  CARDIOVASCULAR: S1, S2 normal. No murmurs, rubs, or gallops.  ABDOMEN: Soft, nontender, nondistended. Bowel sounds present. No organomegaly or mass.  EXTREMITIES: 3+ pedal edema, all the way up to the abdominal wall noted. No  cyanosis, or clubbing.  NEUROLOGIC: Cranial nerves II through XII are intact. Muscle strength 5/5 in all extremities. Sensation intact. Gait not checked. Global weakness noted. PSYCHIATRIC: The patient is alert and oriented x 3.  SKIN: No obvious rash, lesion, or ulcer.    LABORATORY PANEL:   CBC  Recent Labs Lab 08/22/17 0436  WBC 8.6  HGB 10.5*  HCT 32.0*  PLT 65*   ------------------------------------------------------------------------------------------------------------------  Chemistries   Recent Labs Lab 08/18/17 0405  08/21/17 0337 08/22/17 0436  NA 146*  < > 151* 149*  K 3.3*  < > 4.1 4.1  CL 115*  < > 115* 118*  CO2 26  < > 27 27  GLUCOSE 264*  < > 257* 195*  BUN 95*  < > 46* 33*  CREATININE 2.01*  < > 1.06 0.98  CALCIUM 7.4*  < > 7.8* 7.9*  MG 2.2  --   --   --   AST 582*  < > 286*  --   ALT 596*  < > 524*  --   ALKPHOS 68  < > 80  --   BILITOT 3.5*  < >  10.4*  --   < > = values in this interval not displayed. ------------------------------------------------------------------------------------------------------------------  Cardiac Enzymes  Recent Labs Lab 08/17/17 1449  TROPONINI 5.41*   ------------------------------------------------------------------------------------------------------------------  RADIOLOGY:  No results found.  EKG:   Orders placed or  performed during the hospital encounter of 08/16/17  . EKG 12-Lead  . EKG 12-Lead  . ED EKG 12-Lead  . ED EKG 12-Lead  . EKG 12-Lead  . EKG 12-Lead    ASSESSMENT AND PLAN:   66 year old male with past medical his presents to hospital secondary to altered mental status. Also had a cardiac arrest and was in ICU. Currently extubated and transferred to the floor  #1 Hemorrhagic shock- was in ICU, received transfusion - appreciate GI consult, angiectasis were cauterized. Hb stable - octreotide drip. On Protonix twice a day -stable hemoglobin.  #2 anasarca-recheck albumin. IV albumin infusion if needed. - started on Lasix and Aldactone.  #3 acute encephalopathy-anoxic from cardiac arrest, and hepatic -Check ammonia level.Much improved mental status.  #4 liver cirrhosis-on rifaximin On nadolol  #5 DM- lantus, ssi  #6 DVT Prophylaxis- TEDS and SCDS   PT recommended SNF  All the records are reviewed and case discussed with Care Management/Social Workerr. Management plans discussed with the patient, family and they are in agreement.  CODE STATUS: DNR  TOTAL TIME TAKING CARE OF THIS PATIENT: 37 minutes.   POSSIBLE D/C IN 2-3 DAYS, DEPENDING ON CLINICAL CONDITION.   Enid Baas M.D on 08/22/2017 at 1:02 PM  Between 7am to 6pm - Pager - [phone]  After 6pm go to [email or web address] - password [email or web address]  [facility]  Office  [phone]  CC: Primary care physician; System, Pcp Not In

## 2017-08-22 NOTE — Evaluation (Signed)
Occupational Therapy Evaluation Patient Details Name: Jesus [name] MRN: [id number] DOB: Jun 17, 1951 Today's Date: 08/22/2017    History of Present Illness Pt admitted for cardiac arrest with complaints of AMS and hematemesis. PMH of cirrhosis, chronic iron deficiency, DM, hyperglycemia, cardiac arrest. Pt had two episodes of cardiac arrest in ED, went through 2 rounds of CPR, intubated from 9/16-9/18. Had severe GI bleed post EGD on 9/17, required multiple transfusions. Has been confused and agitated, has mitts on hands and has been on Haldol.     Clinical Impression   Patient seen for OT evaluation this date.  Patient with extensive medical history as above.  Patient lives with wife at home and required some assistance prior to admission, unsure of accuracy of what patient reports since information from his wife in the chart is different.  He now has a rectal tube, catheter, muscle weakness, decreased ability to perform transfers and functional mobility.  Patient required min guard for supine to sit transfer this date and has shown progress daily with most tasks.  Patient was fed by staff this date and was dependent with am care however when given the opportunity, patient was able to participate in this skills with less assistance (see eval for specifics).  He would benefit from skilled OT to maximize his safety and independence in daily tasks. Recommend STR upon discharge to improve independence prior to returning home.     Follow Up Recommendations  SNF    Equipment Recommendations       Recommendations for Other Services       Precautions / Restrictions Precautions Precautions: Fall Restrictions Weight Bearing Restrictions: No      Mobility Bed Mobility Overal bed mobility: Needs Assistance Bed Mobility: Rolling Rolling: Supervision         General bed mobility comments: Supine to sit EOB with supervision, cues and use of bed rails. Able to sit EOB for up to 20 mins for tasks  with min guard.  Sit to supine with mod assist, max assist to push up in bed.   Transfers Overall transfer level: Needs assistance                    Balance                                           ADL either performed or assessed with clinical judgement   ADL Overall ADL's : Needs assistance/impaired Eating/Feeding: Set up Eating/Feeding Details (indicate cue type and reason): Patient was fed this am but is able to demo hand to mouth patterns Grooming: Min guard;Set up;Sitting   Upper Body Bathing: Minimal assistance   Lower Body Bathing: Maximal assistance   Upper Body Dressing : Minimal assistance   Lower Body Dressing: Maximal assistance                 General ADL Comments: Aide reports she provided max assist to dependent care to patient however when therapist directed patient in activities he required less assistance. Patient has a rectal tube and catheter.     Vision Baseline Vision/History: Wears glasses       Perception     Praxis      Pertinent Vitals/Pain Pain Assessment: No/denies pain Faces Pain Scale: No hurt     Hand Dominance Right   Extremity/Trunk Assessment Upper Extremity Assessment Upper Extremity Assessment: Generalized weakness  Lower Extremity Assessment Lower Extremity Assessment: Generalized weakness       Communication Communication Communication: No difficulties   Cognition Arousal/Alertness: Awake/alert;Lethargic Behavior During Therapy: WFL for tasks assessed/performed Overall Cognitive Status: Within Functional Limits for tasks assessed  Patient able to follow 1-2 step commands.  Has a Recruitment consultant in the room at all times.                                General Comments: Patient alert and participatory but unsure of reliablility of his answers to questions.   General Comments  Patient sat EOB with min guard to supervision for 20 mins during session    Exercises      Shoulder Instructions      Home Living Family/patient expects to be discharged to:: Private residence Living Arrangements: Spouse/significant other Available Help at Discharge: Family Type of Home: House Home Access: Stairs to enter;Ramped entrance Entrance Stairs-Number of Steps: 4 Entrance Stairs-Rails: Left;Right Home Layout: One level     Bathroom Shower/Tub: Producer, television/film/video: Standard     Home Equipment: Environmental consultant - 4 wheels;Bedside commode   Additional Comments: has a reacher, sockaid and shoehorn      Prior Functioning/Environment Level of Independence: Needs assistance  Gait / Transfers Assistance Needed: Did not use assistive device, chart indicates wife reports patient with short shuffling gait. ADL's / Homemaking Assistance Needed: Chart indicates wife assisted patient at home with self care tasks however patient claims he was independent.              OT Problem List: Decreased strength;Decreased knowledge of use of DME or AE;Decreased range of motion;Increased edema;Decreased activity tolerance;Decreased knowledge of precautions;Decreased safety awareness;Decreased cognition      OT Treatment/Interventions: Self-care/ADL training;Therapeutic exercise;Patient/family education;Energy conservation;Therapeutic activities;DME and/or AE instruction;Cognitive remediation/compensation    OT Goals(Current goals can be found in the care plan section) Acute Rehab OT Goals Patient Stated Goal: "to be able to take care of self and live my life." OT Goal Formulation: With patient Time For Goal Achievement: 09/05/17 Potential to Achieve Goals: Good  OT Frequency: Min 1X/week   Barriers to D/C:            Co-evaluation              AM-PAC PT "6 Clicks" Daily Activity     Outcome Measure Help from another person eating meals?: A Little Help from another person taking care of personal grooming?: A Little Help from another person toileting, which  includes using toliet, bedpan, or urinal?: A Lot Help from another person bathing (including washing, rinsing, drying)?: A Lot Help from another person to put on and taking off regular upper body clothing?: A Little Help from another person to put on and taking off regular lower body clothing?: Total 6 Click Score: 14   End of Session    Activity Tolerance: Patient tolerated treatment well Patient left: in bed;with call bell/phone within reach;with nursing/sitter in room  OT Visit Diagnosis: Muscle weakness (generalized) (M62.81)                Time: 0930-1001 OT Time Calculation (min): 31 min Charges:  OT General Charges $OT Visit: 1 Visit OT Evaluation $OT Eval Moderate Complexity: 1 Mod OT Treatments $Self Care/Home Management : 8-22 mins G-Codes:     Amy T Lovett, OTR/L, CLT   Lovett,Amy 08/22/2017, 10:42 AM

## 2017-08-23 LAB — GLUCOSE, CAPILLARY
GLUCOSE-CAPILLARY: 275 mg/dL — AB (ref 65–99)
GLUCOSE-CAPILLARY: 206 mg/dL — AB (ref 65–99)
Glucose-Capillary: 202 mg/dL — ABNORMAL HIGH (ref 65–99)
Glucose-Capillary: 211 mg/dL — ABNORMAL HIGH (ref 65–99)

## 2017-08-23 LAB — COMPREHENSIVE METABOLIC PANEL
ALT: 248 U/L — ABNORMAL HIGH (ref 17–63)
AST: 80 U/L — ABNORMAL HIGH (ref 15–41)
Albumin: 2.2 g/dL — ABNORMAL LOW (ref 3.5–5.0)
Alkaline Phosphatase: 97 U/L (ref 38–126)
Anion gap: 8 (ref 5–15)
BUN: 34 mg/dL — AB (ref 6–20)
CO2: 25 mmol/L (ref 22–32)
Calcium: 8.1 mg/dL — ABNORMAL LOW (ref 8.9–10.3)
Chloride: 113 mmol/L — ABNORMAL HIGH (ref 101–111)
Creatinine, Ser: 0.97 mg/dL (ref 0.61–1.24)
GFR calc Af Amer: >60 mL/min (ref >60)
Glucose, Bld: 238 mg/dL — ABNORMAL HIGH (ref 65–99)
POTASSIUM: 3.5 mmol/L (ref 3.5–5.1)
Sodium: 146 mmol/L — ABNORMAL HIGH (ref 135–145)
Total Bilirubin: 10.4 mg/dL — ABNORMAL HIGH (ref 0.3–1.2)
Total Protein: 5.8 g/dL — ABNORMAL LOW (ref 6.5–8.1)

## 2017-08-23 LAB — AMMONIA: Ammonia: 36 umol/L — ABNORMAL HIGH (ref 9–35)

## 2017-08-23 LAB — PROTIME-INR
INR: 1.63
Prothrombin Time: 19.2 seconds — ABNORMAL HIGH (ref 11.4–15.2)

## 2017-08-23 MED ORDER — NADOLOL 20 MG PO TABS
20.0000 mg | ORAL_TABLET | Freq: Every day | ORAL | Status: DC
  Administered 2017-08-24 – 2017-09-01 (×9): 20 mg via ORAL
  Filled 2017-08-23 (×9): qty 1

## 2017-08-23 MED ORDER — LACTULOSE 10 GM/15ML PO SOLN
20.0000 g | Freq: Two times a day (BID) | ORAL | Status: DC
  Administered 2017-08-23 – 2017-08-28 (×10): 20 g via ORAL
  Filled 2017-08-23 (×10): qty 30

## 2017-08-23 MED ORDER — ALBUMIN HUMAN 25 % IV SOLN
12.5000 g | Freq: Two times a day (BID) | INTRAVENOUS | Status: AC
  Administered 2017-08-23 – 2017-08-25 (×5): 12.5 g via INTRAVENOUS
  Filled 2017-08-23 (×6): qty 50

## 2017-08-23 NOTE — Progress Notes (Signed)
[name] MD, MRCP(U.K) [address]  [address]  [place], [place] [address]  Main: [phone]    Jesus [name] is being followed for variceal bleed and acute liver failure S  subjective: No  complaints,   Objective: Vital signs in last 24 hours: Vitals:   08/22/17 0406 08/22/17 1500 08/22/17 1937 08/23/17 0531  BP: 138/77 (!) 141/78 126/76 133/75  Pulse: 88 90 94 79  Resp:   18 18  Temp: 98.6 F (37 C) 98.8 F (37.1 C) 98.1 F (36.7 C) 98.1 F (36.7 C)  TempSrc: Oral Oral Oral Oral  SpO2: 96% 97% 96% 98%  Weight: (!) 302 lb 0.5 oz (137 kg)     Height:  (1.651 m)      Weight change:   Intake/Output Summary (Last 24 hours) at 08/23/17 0857 Last data filed at 08/23/17 0600  Gross per 24 hour  Intake             1142 ml  Output             1900 ml  Net             -758 ml     Exam: Heart:: Regular rate and rhythm, S1S2 present or without murmur or extra heart sounds Lungs: normal, clear to auscultation and clear to auscultation and percussion Abdomen: soft, nontender, normal bowel sounds   Lab Results: @ Micro Results: Recent Results (from the past 240 hour(s))  Blood Culture (routine x 2)     Status: None   Collection Time: 08/16/17 12:50 AM  Result Value Ref Range Status   Specimen Description BLOOD LEFT ARM  Final   Special Requests   Final    BOTTLES DRAWN AEROBIC AND ANAEROBIC Blood Culture adequate volume   Culture NO GROWTH 5 DAYS  Final   Report Status 08/21/2017 FINAL  Final  Blood Culture (routine x 2)     Status: None   Collection Time: 08/16/17 12:51 AM  Result Value Ref Range Status   Specimen Description BLOOD LEFT ANTECUBITAL  Final   Special Requests   Final    BOTTLES DRAWN AEROBIC AND ANAEROBIC Blood Culture adequate volume   Culture NO GROWTH 5 DAYS  Final   Report Status 08/21/2017 FINAL  Final  Urine culture     Status: Abnormal   Collection Time: 08/16/17 12:51 AM  Result Value Ref Range Status   Specimen Description URINE, RANDOM  Final   Special Requests NONE  Final   Culture MULTIPLE SPECIES PRESENT, SUGGEST RECOLLECTION (A)  Final   Report Status 08/17/2017 FINAL  Final  MRSA PCR Screening     Status: None   Collection Time: 08/16/17  4:56 AM  Result Value Ref Range Status   MRSA by PCR NEGATIVE NEGATIVE Final    Comment:        The GeneXpert MRSA Assay (FDA approved for NASAL specimens only), is one component of a comprehensive MRSA colonization surveillance program. It is not intended to diagnose MRSA infection nor to guide or monitor treatment for MRSA infections.    Studies/Results: No results found. Medications: I have reviewed the patient's current medications. Scheduled Meds: . furosemide  40 mg Oral Daily  . insulin aspart  0-5 Units Subcutaneous QHS  . insulin aspart  0-9 Units Subcutaneous TID WC  . insulin glargine  8 Units Subcutaneous QHS  . lactulose  20 g Oral BID  . nadolol  10 mg Oral QHS  .  pantoprazole (PROTONIX) IV  40 mg Intravenous Q12H  . rifaximin  550 mg Oral BID  . sodium chloride flush  3 mL Intravenous Q12H  . spironolactone  25 mg Oral Daily   Continuous Infusions: PRN Meds:.haloperidol lactate, hydrALAZINE, LORazepam, [DISCONTINUED] ondansetron **OR** ondansetron (ZOFRAN) IV   Assessment: Active Problems:   Cardiac arrest (HCC)   Hemorrhagic shock (HCC)   AKI (acute kidney injury) (HCC)   Hepatic cirrhosis (HCC)   Liver cirrhosis secondary to NASH (HCC)   Hematemesis   Palliative care encounter   DNR (do not resuscitate) discussion   Acute blood loss anemia   Coagulopathy (HCC)   Acute encephalopathy   DNR (do not resuscitate) BP 133/75 (BP Location: Left Arm)   Pulse 79   Temp 98.1 F (36.7 C) (Oral)   Resp 18   Ht  (1.651 m)   Wt (!) 302 lb 0.5 oz (137 kg)   SpO2 98%   BMI 50.26 kg/m   Jesus Fortis Rouse65 y.o.maleadmitted to the ICU with PEA arrest x 2 , acute on chronic anemia with prior evaluation at the  [facility] with no source of blood loss seen. H/o liver cirrhosis . On this admission noted to have a possible shocked liver, on pressors , received blood. 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 Variceal bleed- stable,  Repeat EGD as outpatient in 2-3 weeks for eradication of all varices. Can increase dose of nadolol. Target is heart rate < 70/min . Brown stool in rectal tube  2. Acute liver failure- likely from shocked liver ,T bilirubin is still rising. Please monitor INR . As long as the INR is stable we can watch and wait. If INR is rising would suggest worsening of liver function irrespective of transaminases and T bilirubin     LOS: 7 days   [name] 08/23/2017, 8:57 AM

## 2017-08-23 NOTE — Progress Notes (Addendum)
[facility]   PATIENT NAME: Jesus [name]    MR#:  [id number]  DATE OF BIRTH:  [birth year]  SUBJECTIVE:  CHIEF COMPLAINT:   Chief Complaint  Patient presents with  . Altered Mental Status  . Hyperglycemia   - abdomen is distended, and complains of occasional cramps. Lower extremity edema is present as well. -Mental status is improving  REVIEW OF SYSTEMS:  Review of Systems  Constitutional: Positive for malaise/fatigue. Negative for chills and fever.  HENT: Negative for congestion, ear discharge, hearing loss and nosebleeds.   Respiratory: Negative for cough, shortness of breath and wheezing.   Cardiovascular: Positive for leg swelling. Negative for chest pain and palpitations.  Gastrointestinal: Positive for abdominal pain. Negative for constipation, diarrhea, nausea and vomiting.  Genitourinary: Negative for dysuria.  Musculoskeletal: Negative for myalgias.  Neurological: Negative for dizziness, speech change, focal weakness, seizures and headaches.  Psychiatric/Behavioral: Negative for depression.    DRUG ALLERGIES:   Allergies  Allergen Reactions  . Penicillins Hives and Other (See Comments)    Has patient had a PCN reaction causing immediate rash, facial/tongue/throat swelling, SOB or lightheadedness with hypotension: Yes Has patient had a PCN reaction causing severe rash involving mucus membranes or skin necrosis: No Has patient had a PCN reaction that required hospitalization: No Has patient had a PCN reaction occurring within the last 10 years: No If all of the above answers are "NO", then may proceed with Cephalosporin use.     VITALS:  Blood pressure 133/75, pulse 79, temperature 98.1 F (36.7 C), temperature source Oral, resp. rate 18, height  (1.651 m), weight (!) 137 kg (302 lb 0.5 oz), SpO2 98 %.  PHYSICAL EXAMINATION:  Physical Exam  GENERAL:  66 y.o.-year-old obese patient lying in the bed with no acute  distress.  EYES: Pupils equal, round, reactive to light and accommodation. No scleral icterus. Extraocular muscles intact.  HEENT: Head atraumatic, normocephalic. Oropharynx and nasopharynx clear.  NECK:  Supple, no jugular venous distention. No thyroid enlargement, no tenderness.  LUNGS: Normal breath sounds bilaterally, no wheezing, rales,rhonchi or crepitation. No use of accessory muscles of respiration.  CARDIOVASCULAR: S1, S2 normal. No murmurs, rubs, or gallops.  ABDOMEN: Soft, nontender, very distended. Bowel sounds present. No organomegaly or mass.  EXTREMITIES: 3+ pedal edema, all the way up to the abdominal wall noted. No  cyanosis, or clubbing.  NEUROLOGIC: Cranial nerves II through XII are intact. Muscle strength 5/5 in all extremities. Sensation intact. Gait not checked. Global weakness noted. PSYCHIATRIC: The patient is alert and oriented x 3.  SKIN: No obvious rash, lesion, or ulcer.    LABORATORY PANEL:   CBC  Recent Labs Lab 08/22/17 0436  WBC 8.6  HGB 10.5*  HCT 32.0*  PLT 65*   ------------------------------------------------------------------------------------------------------------------  Chemistries   Recent Labs Lab 08/18/17 0405  08/23/17 0602  NA 146*  < > 146*  K 3.3*  < > 3.5  CL 115*  < > 113*  CO2 26  < > 25  GLUCOSE 264*  < > 238*  BUN 95*  < > 34*  CREATININE 2.01*  < > 0.97  CALCIUM 7.4*  < > 8.1*  MG 2.2  --   --   AST 582*  < > 80*  ALT 596*  < > 248*  ALKPHOS 68  < > 97  BILITOT 3.5*  < > 10.4*  < > = values in this interval not displayed. ------------------------------------------------------------------------------------------------------------------  Cardiac  Enzymes  Recent Labs Lab 08/17/17 1449  TROPONINI 5.41*   ------------------------------------------------------------------------------------------------------------------  RADIOLOGY:  No results found.  EKG:   Orders placed or performed during the hospital  encounter of 08/16/17  . EKG 12-Lead  . EKG 12-Lead  . ED EKG 12-Lead  . ED EKG 12-Lead  . EKG 12-Lead  . EKG 12-Lead    ASSESSMENT AND PLAN:   66 year old male with past medical his presents to hospital secondary to altered mental status. Also had a cardiac arrest and was in ICU. 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 Hb stable - off octreotide drip. On Protonix twice a day -stable hemoglobin. -outpatient EGD in 2-3 weeks  #2 anasarca- secondary to liver failure. Albumin is low at 2.2. Will order IV albumin for 3 days - started on Lasix and Aldactone. -ultrasound abdomen to look for ascites and if it needs to be drained - on empiric cipro and flagyl- stop after 10 days  #3 acute encephalopathy-anoxic from cardiac arrest, and also hepatic -improving mental status. Ammonia is slightly elevated. -Started on lactulose today.  #4 acute liver failure with history of liver cirrhosis-transaminases are improving, continue to monitor INR per GI recommendations. Bilirubin is still elevated. on rifaximin On nadolol-dose increased  #5 DM- lantus, ssi  #6 DVT Prophylaxis- TEDS and SCDS   PT recommended SNF  All the records are reviewed and case discussed with Care Management/Social Workerr. Management plans discussed with the patient, family and they are in agreement.  CODE STATUS: DNR  TOTAL TIME TAKING CARE OF THIS PATIENT: 37 minutes.   POSSIBLE D/C IN 2-3 DAYS, DEPENDING ON CLINICAL CONDITION.   Jeric Slagel M.D on 08/23/2017 at 1:00 PM  Between 7am to 6pm - Pager - [phone]  After 6pm go to [email or web address] - password [email or web address]  [facility]  Office  [phone]  CC: Primary care physician; System, Pcp Not In

## 2017-08-23 NOTE — Clinical Social Work Note (Signed)
Clinical Social Work Assessment  Patient Details  Name: Jesus [name] MRN: [id number] Date of Birth: 08-Jul-1951  Date of referral:  08/23/17               Reason for consult:  Facility Placement                Permission sought to share information with:  Chartered certified accountant granted to share information::  Yes, Verbal Permission Granted  Name::        Agency::     Relationship::     Contact Information:     Housing/Transportation Living arrangements for the past 2 months:  Single Family Home Source of Information:  Patient, Adult Children, Medical Team Patient Interpreter Needed:  None Criminal Activity/Legal Involvement Pertinent to Current Situation/Hospitalization:  No - Comment as needed Significant Relationships:  Adult Children Lives with:  Self Do you feel safe going back to the place where you live?  Yes Need for family participation in patient care:  No (Coment)  Care giving concerns:  PT recommendation for STR   Social Worker assessment / plan:  CSW met with the patient and his daughter Jesus [name] at bedside to discuss discharge planning. The patient was mildly confused and not fully able to participate. The patient's daughter Jesus [name] reported that the patient does not wish to use VA benefits due to dissatisfaction with options for contract SNFs ([facility] or the [facility] would be the only options). The CSW explained the referral process and provided current bed offers from [facility] and [facility]. The family declined both and indicated that the preferences are for [facility] or [facility] in [place] due to location nearer to family.  The CSW has sent referral to [facility] as they are in the [place]. The referral for [facility] will occur during regular business hours for coordination. CSW will continue to follow to facilitate discharge.   Employment status:  Retired Nurse, adult PT  Recommendations:  [place] / Referral to community resources:  [place]  Patient/Family's Response to care:  The patient and his family were pleasant and involved in discussion and proactive in their concerns.  Patient/Family's Understanding of and Emotional Response to Diagnosis, Current Treatment, and Prognosis:  The patient and his family understand the need for STR and are in agreement with the discharge plan.  Emotional Assessment Appearance:  Appears older than stated age Attitude/Demeanor/Rapport:  Lethargic Affect (typically observed):  Calm Orientation:  Oriented to Self, Oriented to Place, Oriented to  Time, Oriented to Situation Alcohol / Substance use:  Never Used Psych involvement (Current and /or in the community):  No (Comment)  Discharge Needs  Concerns to be addressed:  Care Coordination, Discharge Planning Concerns Readmission within the last 30 days:  No Current discharge risk:  Chronically ill Barriers to Discharge:  Continued Medical Work up   [employer], LCSW 08/23/2017, 12:03 PM

## 2017-08-23 NOTE — Progress Notes (Signed)
More alert, appropriate today with improved cognition; still needs reinforcement at intervals. Feeding self with tray set up. Reports generalized intermittent abdominal cramping. Fecal collection system still in place for diarrhea management. Family in at long intervals. Continued 3-4+ generalized edema with penis/scrotum grossly swollen. Advised pt/wife of scheduled [place] tomorrow with stated understanding.

## 2017-08-24 ENCOUNTER — Inpatient Hospital Stay: Payer: Medicare PPO

## 2017-08-24 LAB — BASIC METABOLIC PANEL
ANION GAP: 6 (ref 5–15)
BUN: 34 mg/dL — AB (ref 6–20)
CHLORIDE: 118 mmol/L — AB (ref 101–111)
CO2: 27 mmol/L (ref 22–32)
Calcium: 8.2 mg/dL — ABNORMAL LOW (ref 8.9–10.3)
Creatinine, Ser: 1.18 mg/dL (ref 0.61–1.24)
GFR calc Af Amer: >60 mL/min (ref >60)
GLUCOSE: 220 mg/dL — AB (ref 65–99)
POTASSIUM: 3.2 mmol/L — AB (ref 3.5–5.1)
Sodium: 151 mmol/L — ABNORMAL HIGH (ref 135–145)

## 2017-08-24 LAB — CBC
HEMATOCRIT: 30.9 % — AB (ref 40.0–52.0)
HEMOGLOBIN: 10.1 g/dL — AB (ref 13.0–18.0)
MCH: 29.2 pg (ref 26.0–34.0)
MCHC: 32.6 g/dL (ref 32.0–36.0)
MCV: 89.4 fL (ref 80.0–100.0)
Platelets: 56 10*3/uL — ABNORMAL LOW (ref 150–440)
RBC: 3.46 MIL/uL — ABNORMAL LOW (ref 4.40–5.90)
RDW: 22 % — ABNORMAL HIGH (ref 11.5–14.5)
WBC: 7.7 10*3/uL (ref 3.8–10.6)

## 2017-08-24 LAB — GLUCOSE, CAPILLARY
GLUCOSE-CAPILLARY: 179 mg/dL — AB (ref 65–99)
GLUCOSE-CAPILLARY: 276 mg/dL — AB (ref 65–99)
GLUCOSE-CAPILLARY: 311 mg/dL — AB (ref 65–99)
Glucose-Capillary: 254 mg/dL — ABNORMAL HIGH (ref 65–99)

## 2017-08-24 LAB — PROTIME-INR
INR: 1.69
Prothrombin Time: 19.7 seconds — ABNORMAL HIGH (ref 11.4–15.2)

## 2017-08-24 MED ORDER — DIPHENHYDRAMINE HCL 25 MG PO CAPS
25.0000 mg | ORAL_CAPSULE | Freq: Once | ORAL | Status: AC
  Administered 2017-08-24: 05:00:00 25 mg via ORAL

## 2017-08-24 MED ORDER — INSULIN GLARGINE 100 UNIT/ML ~~LOC~~ SOLN
12.0000 [IU] | Freq: Every day | SUBCUTANEOUS | Status: DC
  Administered 2017-08-24 – 2017-08-27 (×4): 12 [IU] via SUBCUTANEOUS
  Filled 2017-08-24 (×5): qty 0.12

## 2017-08-24 MED ORDER — DIPHENHYDRAMINE HCL 25 MG PO CAPS
ORAL_CAPSULE | ORAL | Status: AC
  Filled 2017-08-24: qty 1

## 2017-08-24 MED ORDER — POLYVINYL ALCOHOL 1.4 % OP SOLN
1.0000 [drp] | Freq: Three times a day (TID) | OPHTHALMIC | Status: DC
  Administered 2017-08-24 – 2017-08-26 (×6): 1 [drp] via OPHTHALMIC
  Filled 2017-08-24: qty 15

## 2017-08-24 NOTE — Progress Notes (Signed)
[facility] [place] [facility]   PATIENT NAME: Jesus [name]    MR#:  [id number]  DATE OF BIRTH:  July 17, 1951  SUBJECTIVE:  CHIEF COMPLAINT:   Chief Complaint  Patient presents with  . Altered Mental Status  . Hyperglycemia   -abdomen remains distended. Mental status is much improved. -eager to sit in the chair today  REVIEW OF SYSTEMS:  Review of Systems  Constitutional: Positive for malaise/fatigue. Negative for chills and fever.  HENT: Negative for congestion, ear discharge, hearing loss and nosebleeds.   Respiratory: Negative for cough, shortness of breath and wheezing.   Cardiovascular: Positive for leg swelling. Negative for chest pain and palpitations.  Gastrointestinal: Positive for abdominal pain. Negative for constipation, diarrhea, nausea and vomiting.  Genitourinary: Negative for dysuria.  Musculoskeletal: Negative for myalgias.  Neurological: Negative for dizziness, speech change, focal weakness, seizures and headaches.  Psychiatric/Behavioral: Negative for depression.    DRUG ALLERGIES:   Allergies  Allergen Reactions  . Penicillins Hives and Other (See Comments)    Has patient had a PCN reaction causing immediate rash, facial/tongue/throat swelling, SOB or lightheadedness with hypotension: Yes Has patient had a PCN reaction causing severe rash involving mucus membranes or skin necrosis: No Has patient had a PCN reaction that required hospitalization: No Has patient had a PCN reaction occurring within the last 10 years: No If all of the above answers are "NO", then may proceed with Cephalosporin use.     VITALS:  Blood pressure 133/60, pulse 81, temperature 98.1 F (36.7 C), temperature source Oral, resp. rate 20, height  (1.651 m), weight (!) 137 kg (302 lb 0.5 oz), SpO2 98 %.  PHYSICAL EXAMINATION:  Physical Exam  GENERAL:  66 y.o.-year-old obese patient lying in the bed with no acute distress.  EYES: Pupils equal, round,  reactive to light and accommodation. No scleral icterus. Extraocular muscles intact.  HEENT: Head atraumatic, normocephalic. Oropharynx and nasopharynx clear.  NECK:  Supple, no jugular venous distention. No thyroid enlargement, no tenderness.  LUNGS: Normal breath sounds bilaterally, no wheezing, rales,rhonchi or crepitation. No use of accessory muscles of respiration.  CARDIOVASCULAR: S1, S2 normal. No murmurs, rubs, or gallops.  ABDOMEN: Soft, nontender, very distended. Bowel sounds present. No organomegaly or mass.  EXTREMITIES: 3+ pedal edema, all the way up to the abdominal wall noted. No  cyanosis, or clubbing.  NEUROLOGIC: Cranial nerves II through XII are intact. Muscle strength 5/5 in all extremities. Sensation intact. Gait not checked. Global weakness noted. PSYCHIATRIC: The patient is alert and oriented x 3.  SKIN: No obvious rash, lesion, or ulcer.    LABORATORY PANEL:   CBC  Recent Labs Lab 08/24/17 0323  WBC 7.7  HGB 10.1*  HCT 30.9*  PLT 56*   ------------------------------------------------------------------------------------------------------------------  Chemistries   Recent Labs Lab 08/18/17 0405  08/23/17 0602 08/24/17 0323  NA 146*  < > 146* 151*  K 3.3*  < > 3.5 3.2*  CL 115*  < > 113* 118*  CO2 26  < > 25 27  GLUCOSE 264*  < > 238* 220*  BUN 95*  < > 34* 34*  CREATININE 2.01*  < > 0.97 1.18  CALCIUM 7.4*  < > 8.1* 8.2*  MG 2.2  --   --   --   AST 582*  < > 80*  --   ALT 596*  < > 248*  --[facility] < > 97  --   BILITOT 3.5*  < >  10.4*  --   < > = values in this interval not displayed. ------------------------------------------------------------------------------------------------------------------  Cardiac Enzymes  Recent Labs Lab 08/17/17 1449  TROPONINI 5.41*   ------------------------------------------------------------------------------------------------------------------  RADIOLOGY:  US Abdomen Complete  Result Date:  08/24/2017 CLINICAL DATA:  Abdominal distension EXAM: ABDOMEN ULTRASOUND COMPLETE COMPARISON:  Abdominal ultrasound of August 17, 2017 and abdominal and pelvic CT scan of August 12, 2010. FINDINGS: Gallbladder: The gallbladder is adequately distended. There is echogenic sludge present as well as stones with the largest stone measuring 1.3 cm. The gallbladder wall is thickened measuring between 3.8 and 7.4 mm. Common bile duct: Diameter: 3.6 mm Liver: The hepatic echotexture is increased. The surface contour is nodular. There is no discrete mass or ductal dilation. No flow was visualized within the portal vein. IVC: No abnormality visualized. Pancreas: Bowel gas largely obscures the pancreatic head and tail. The visualized portions of the pancreatic body appear normal. Spleen: There is splenomegaly with splenic length of 19.5 cm and calculated volume of 1240 cc. Right Kidney: Length: 8.6 cm. Echogenicity within normal limits. No mass or hydronephrosis visualized. Left Kidney: Length: 12.4 cm. Echogenicity within normal limits. No mass or hydronephrosis visualized. Abdominal aorta: The abdominal aorta is largely obscured by bowel gas. Other findings: There is ascites. There are bilateral pleural effusions. IMPRESSION: Findings compatible with hepatic cirrhosis. No flow within the portal vein is observed. There is ascites as well as bilateral pleural effusions. There is marked splenomegaly. Gallstones and sludge. Gallbladder wall thickening to as much is 7.4 mm likely related to the ascites. No positive sonographic Murphy sign. Limited visualization of the pancreas. Chronically atrophic right kidney. Electronically Signed   By: David  [place] M.D.   On: 08/24/2017 09:48    EKG:   Orders placed or performed during the hospital encounter of 08/16/17  . EKG 12-Lead  . EKG 12-Lead  . ED EKG 12-Lead  . ED EKG 12-Lead  . EKG 12-Lead  . EKG 12-Lead    ASSESSMENT AND PLAN:   66 year old male with past  medical his presents to hospital secondary to altered mental status. Also had a cardiac arrest and was in ICU. 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 Hb stable - off octreotide drip. On Protonix twice a day -stable hemoglobin. -outpatient EGD in 2-3 weeks  #2 anasarca- secondary to liver failure. Albumin is low at 2.2.  - on IV albumin for 3 days - on Lasix and Aldactone. With Lasix, patient getting hypernatremic, also could be because he is on  nectar thick liquids. -ultrasound abdomen with liver cirrhosis, ? Portal vein thrombosis, spleenomegaly and ascites - US guided ascites tap requested - on empiric cipro and flagyl- stop after 10 days  #3 acute encephalopathy-anoxic from cardiac arrest, and also hepatic -improving mental status. Ammonia is slightly elevated. -Started on lactulose. - speech therapy to re-evaluate today as mentally much improved  #4 acute liver failure with history of liver cirrhosis-transaminases are improving, continue to monitor INR per GI recommendations. Bilirubin is still elevated. on rifaximin On nadolol-dose increased  #5 DM- lantus, ssi Appreciate diabetes coordinator input  #6 DVT Prophylaxis- TEDS and SCDS   PT recommended SNF- likely needs rehab at  Discharge. Discussed with daughters- agree with long term hospice services   All the records are reviewed and case discussed with Care Management/Social Workerr. Management plans discussed  with the patient, family and they are in agreement.  CODE STATUS: DNR  TOTAL TIME TAKING CARE OF THIS PATIENT: 37 minutes.   POSSIBLE D/C IN 2-3 DAYS, DEPENDING ON CLINICAL CONDITION.   Enid Baas M.D on 08/24/2017 at 12:40 PM  Between 7am to 6pm - Pager - [phone]  After 6pm go to [email or web address] - password [email or web address]  [facility] [place] [facility]  Office  [phone]  CC: Primary care physician; System, Pcp Not In

## 2017-08-24 NOTE — Progress Notes (Signed)
Inpatient Diabetes Program Recommendations  AACE/ADA: New Consensus Statement on Inpatient Glycemic Control (2015)  Target Ranges:  Prepandial:   less than 140 mg/dL      [facility] postprandial:   less than 180 mg/dL (1-2 hours)      Critically ill patients:  140 - 180 mg/dL   Results for Jesus [name], Jesus [name] (MRN [id number]) as of 08/24/2017 12:20  Ref. Range 08/23/2017 07:59 08/23/2017 12:08 08/23/2017 16:49 08/23/2017 21:03  Glucose-Capillary Latest Ref Range: 65 - 99 mg/dL 409 (H)  3 units Novolog 275 (H)  5 units Novolog 211 (H)  3 units Novolog 206 (H)  2 units Novolog +   8 units Lantus   Results for Jesus [name], Jesus [name] (MRN [id number]) as of 08/24/2017 12:20  Ref. Range 08/24/2017 08:01 08/24/2017 11:47  Glucose-Capillary Latest Ref Range: 65 - 99 mg/dL 956 (H)  2 units Novolog 254 (H)    Home DM Meds: Lantus 34 units daily       Novolog 7 units TID       Glyburide 5 mg daily       Metformin 1000 mg BID  Current Insulin Orders: Lantus 8 units QHS       Novolog Sensitive Correction Scale/ SSI (0-9 units) TID AC + HS       MD- Please consider the following in-hospital insulin adjustments:  1. Increase Lantus to 10 units QHS (20% increase)  2. Increase Novolog SSI to Moderate scale (0-15 units) TID AC + HS      --Will follow patient during hospitalization--  Ambrose Finland RN, MSN, CDE Diabetes Coordinator Inpatient Glycemic Control Team Team Pager: [phone] (8a-5p)

## 2017-08-25 ENCOUNTER — Inpatient Hospital Stay: Payer: Medicare PPO

## 2017-08-25 LAB — BODY FLUID CELL COUNT WITH DIFFERENTIAL
EOS FL: 0 %
LYMPHS FL: 39 %
MONOCYTE-MACROPHAGE-SEROUS FLUID: 54 %
Neutrophil Count, Fluid: 7 %
Other Cells, Fluid: 0 %
Total Nucleated Cell Count, Fluid: 258 cu mm

## 2017-08-25 LAB — GLUCOSE, CAPILLARY
GLUCOSE-CAPILLARY: 162 mg/dL — AB (ref 65–99)
GLUCOSE-CAPILLARY: 237 mg/dL — AB (ref 65–99)
Glucose-Capillary: 233 mg/dL — ABNORMAL HIGH (ref 65–99)
Glucose-Capillary: 209 mg/dL — ABNORMAL HIGH (ref 65–99)

## 2017-08-25 LAB — COMPREHENSIVE METABOLIC PANEL
ALBUMIN: 2.4 g/dL — AB (ref 3.5–5.0)
ALT: 131 U/L — ABNORMAL HIGH (ref 17–63)
ANION GAP: 8 (ref 5–15)
AST: 73 U/L — AB (ref 15–41)
Alkaline Phosphatase: 97 U/L (ref 38–126)
BILIRUBIN TOTAL: 11.5 mg/dL — AB (ref 0.3–1.2)
BUN: 31 mg/dL — AB (ref 6–20)
CHLORIDE: 120 mmol/L — AB (ref 101–111)
CO2: 25 mmol/L (ref 22–32)
Calcium: 8.5 mg/dL — ABNORMAL LOW (ref 8.9–10.3)
Creatinine, Ser: 1.06 mg/dL (ref 0.61–1.24)
GFR calc Af Amer: >60 mL/min (ref >60)
Glucose, Bld: 195 mg/dL — ABNORMAL HIGH (ref 65–99)
POTASSIUM: 2.9 mmol/L — AB (ref 3.5–5.1)
Sodium: 153 mmol/L — ABNORMAL HIGH (ref 135–145)
Total Protein: 5.7 g/dL — ABNORMAL LOW (ref 6.5–8.1)

## 2017-08-25 LAB — BASIC METABOLIC PANEL
ANION GAP: 7 (ref 5–15)
Anion gap: 10 (ref 5–15)
BUN: 27 mg/dL — AB (ref 6–20)
BUN: 24 mg/dL — ABNORMAL HIGH (ref 6–20)
CALCIUM: 8.6 mg/dL — AB (ref 8.9–10.3)
CHLORIDE: 118 mmol/L — AB (ref 101–111)
CO2: 24 mmol/L (ref 22–32)
CO2: 22 mmol/L (ref 22–32)
Calcium: 8.3 mg/dL — ABNORMAL LOW (ref 8.9–10.3)
Chloride: 118 mmol/L — ABNORMAL HIGH (ref 101–111)
Creatinine, Ser: 1.14 mg/dL (ref 0.61–1.24)
Creatinine, Ser: 0.98 mg/dL (ref 0.61–1.24)
GFR calc Af Amer: >60 mL/min (ref >60)
GFR calc non Af Amer: >60 mL/min (ref >60)
GLUCOSE: 249 mg/dL — AB (ref 65–99)
Glucose, Bld: 254 mg/dL — ABNORMAL HIGH (ref 65–99)
Potassium: 2.9 mmol/L — ABNORMAL LOW (ref 3.5–5.1)
Potassium: 4.8 mmol/L (ref 3.5–5.1)
SODIUM: 152 mmol/L — AB (ref 135–145)
Sodium: 147 mmol/L — ABNORMAL HIGH (ref 135–145)

## 2017-08-25 LAB — LACTATE DEHYDROGENASE, PLEURAL OR PERITONEAL FLUID: LD FL: 56 U/L — AB (ref 3–23)

## 2017-08-25 LAB — AMYLASE, PLEURAL OR PERITONEAL FLUID: AMYLASE FL: 19 U/L

## 2017-08-25 LAB — ALBUMIN, PLEURAL OR PERITONEAL FLUID

## 2017-08-25 LAB — PROTEIN, PLEURAL OR PERITONEAL FLUID

## 2017-08-25 LAB — PROTIME-INR
INR: 1.75
Prothrombin Time: 20.3 seconds — ABNORMAL HIGH (ref 11.4–15.2)

## 2017-08-25 LAB — MAGNESIUM: MAGNESIUM: 1.7 mg/dL (ref 1.7–2.4)

## 2017-08-25 LAB — GLUCOSE, PLEURAL OR PERITONEAL FLUID: GLUCOSE FL: 200 mg/dL

## 2017-08-25 MED ORDER — KCL IN DEXTROSE-NACL 20-5-0.45 MEQ/L-%-% IV SOLN
INTRAVENOUS | Status: AC
  Administered 2017-08-25: 12:00:00 via INTRAVENOUS
  Filled 2017-08-25: qty 1000

## 2017-08-25 MED ORDER — POTASSIUM CHLORIDE 10 MEQ/100ML IV SOLN
INTRAVENOUS | Status: AC
  Administered 2017-08-25: 10 meq
  Filled 2017-08-25: qty 100

## 2017-08-25 MED ORDER — POTASSIUM CHLORIDE 10 MEQ/100ML IV SOLN
10.0000 meq | INTRAVENOUS | Status: AC
  Administered 2017-08-25 (×3): 10 meq via INTRAVENOUS
  Filled 2017-08-25 (×2): qty 100

## 2017-08-25 MED ORDER — POTASSIUM CHLORIDE CRYS ER 20 MEQ PO TBCR
40.0000 meq | EXTENDED_RELEASE_TABLET | ORAL | Status: AC
  Administered 2017-08-25 (×2): 40 meq via ORAL
  Filled 2017-08-25 (×3): qty 2

## 2017-08-25 MED ORDER — ENSURE ENLIVE PO LIQD
237.0000 mL | Freq: Three times a day (TID) | ORAL | Status: DC
  Administered 2017-08-26 – 2017-08-31 (×14): 237 mL via ORAL

## 2017-08-25 NOTE — Progress Notes (Signed)
MEDICATION RELATED CONSULT NOTE - follow  Pharmacy Consult for electrolyte management Indication: hypokalemia  Allergies  Allergen Reactions  . Penicillins Hives and Other (See Comments)    Has patient had a PCN reaction causing immediate rash, facial/tongue/throat swelling, SOB or lightheadedness with hypotension: Yes Has patient had a PCN reaction causing severe rash involving mucus membranes or skin necrosis: No Has patient had a PCN reaction that required hospitalization: No Has patient had a PCN reaction occurring within the last 10 years: No If all of the above answers are "NO", then may proceed with Cephalosporin use.     Patient Measurements: Height:  (165.1 cm) Weight: (!) 302 lb 0.5 oz (137 kg) IBW/kg (Calculated) : 61.5  Vital Signs: Temp: 98.8 F (37.1 C) (09/25 1100) Temp Source: Oral (09/25 1100) BP: 136/62 (09/25 1100) Pulse Rate: 98 (09/25 1100) Intake/Output from previous day: 09/24 0701 - 09/25 0700 In: 635 [P.O.:485; IV Piggyback:150] Out: 1200 [Urine:1000; Stool:200] Intake/Output from this shift: Total I/O In: 240 [P.O.:240] Out: -   Labs:  Recent Labs  08/23/17 0602 08/24/17 0323 08/25/17 0518 08/25/17 1230 08/25/17 1950  WBC  --  7.7  --   --   --   HGB  --  10.1*  --   --   --   HCT  --  30.9*  --   --   --   PLT  --  56*  --   --   --   CREATININE 0.97 1.18 1.06 1.14 0.98  MG  --   --  1.7  --   --   ALBUMIN 2.2*  --  2.4*  --   --   PROT 5.8*  --  5.7*  --   --   AST 80*  --  73*  --   --   ALT 248*  --  131*  --   --   ALKPHOS 97  --  97  --   --   BILITOT 10.4*  --  11.5*  --   --    Estimated Creatinine Clearance: 96.2 mL/min (by C-G formula based on SCr of 0.98 mg/dL).   Microbiology: Recent Results (from the past 720 hour(s))  Blood Culture (routine x 2)     Status: None   Collection Time: 08/16/17 12:50 AM  Result Value Ref Range Status   Specimen Description BLOOD LEFT ARM  Final   Special Requests   Final   BOTTLES DRAWN AEROBIC AND ANAEROBIC Blood Culture adequate volume   Culture NO GROWTH 5 DAYS  Final   Report Status 08/21/2017 FINAL  Final  Blood Culture (routine x 2)     Status: None   Collection Time: 08/16/17 12:51 AM  Result Value Ref Range Status   Specimen Description BLOOD LEFT ANTECUBITAL  Final   Special Requests   Final    BOTTLES DRAWN AEROBIC AND ANAEROBIC Blood Culture adequate volume   Culture NO GROWTH 5 DAYS  Final   Report Status 08/21/2017 FINAL  Final  Urine culture     Status: Abnormal   Collection Time: 08/16/17 12:51 AM  Result Value Ref Range Status   Specimen Description URINE, RANDOM  Final   Special Requests NONE  Final   Culture MULTIPLE SPECIES PRESENT, SUGGEST RECOLLECTION (A)  Final   Report Status 08/17/2017 FINAL  Final  MRSA PCR Screening     Status: None   Collection Time: 08/16/17  4:56 AM  Result Value Ref Range Status  MRSA by PCR NEGATIVE NEGATIVE Final    Comment:        The GeneXpert MRSA Assay (FDA approved for NASAL specimens only), is one component of a comprehensive MRSA colonization surveillance program. It is not intended to diagnose MRSA infection nor to guide or monitor treatment for MRSA infections.   Body fluid culture     Status: None (Preliminary result)   Collection Time: 08/25/17  9:04 AM  Result Value Ref Range Status   Specimen Description PERITONEAL  Final   Special Requests NONE  Final   Gram Stain   Final    RARE WBC PRESENT, PREDOMINANTLY MONONUCLEAR NO ORGANISMS SEEN Performed at [facility], [address] [place], [place] [address]    Culture PENDING  Incomplete   Report Status PENDING  Incomplete    Medical History: Past Medical History:  Diagnosis Date  . Cirrhosis of liver (HCC)   . Diabetes (HCC)     Medications:  Prescriptions Prior to Admission  Medication Sig Dispense Refill Last Dose  . aspirin 81 MG EC tablet Take 81 mg by mouth daily. Swallow whole.     . baclofen (LIORESAL)  10 MG tablet Take 10 mg by mouth 2 (two) times daily as needed for muscle spasms.     Marland Kitchen escitalopram (LEXAPRO) 10 MG tablet Take 20 mg by mouth daily.     Marland Kitchen glipiZIDE (GLUCOTROL) 5 MG tablet Take by mouth daily before breakfast.     . HYDROcodone-acetaminophen (NORCO/VICODIN) 5-325 MG tablet Take 1 tablet by mouth 2 (two) times daily as needed for moderate pain.     . hydrOXYzine (ATARAX/VISTARIL) 25 MG tablet Take 25 mg by mouth at bedtime as needed.     Marland Kitchen oxybutynin (DITROPAN) 5 MG tablet Take 5 mg by mouth 2 (two) times daily as needed for bladder spasms.     Marland Kitchen spironolactone (ALDACTONE) 25 MG tablet Take 25 mg by mouth daily.     . tamsulosin (FLOMAX) 0.4 MG CAPS capsule Take 0.4 mg by mouth daily.     . traZODone (DESYREL) 100 MG tablet Take 150 mg by mouth at bedtime.     . furosemide (LASIX) 40 MG tablet Take 40 mg by mouth 2 (two) times daily.    Past Week at Unknown time  . metFORMIN (GLUCOPHAGE) 1000 MG tablet Take 1,000 mg by mouth 2 (two) times daily with a meal.   Past Week at unknown  . pantoprazole (PROTONIX) 40 MG tablet Take 1 tablet (40 mg total) by mouth daily. 30 tablet 0   . primidone (MYSOLINE) 50 MG tablet Take 100 mg by mouth at bedtime.     . [DISCONTINUED] glyBURIDE (DIABETA) 5 MG tablet Take 5 mg by mouth daily.   Past Week at Unknown time  . [DISCONTINUED] insulin aspart (NOVOLOG) 100 UNIT/ML injection Inject 7 Units into the skin 3 (three) times daily with meals. 10 mL 11   . [DISCONTINUED] insulin glargine (LANTUS) 100 UNIT/ML injection Inject 0.34 mLs (34 Units total) into the skin daily. 10 mL 11   . [DISCONTINUED] lactulose (CHRONULAC) 10 GM/15ML solution Take 45 mLs (30 g total) by mouth daily. 240 mL 0   . [DISCONTINUED] levofloxacin (LEVAQUIN) 750 MG tablet Take 1 tablet (750 mg total) by mouth daily. 3 tablet 0   . [DISCONTINUED] traZODone (DESYREL) 50 MG tablet Take 75 mg by mouth at bedtime.    Past Week at Unknown time   Scheduled:  . feeding supplement  (ENSURE  ENLIVE)  237 mL Oral TID BM  . insulin aspart  0-5 Units Subcutaneous QHS  . insulin aspart  0-9 Units Subcutaneous TID WC  . insulin glargine  12 Units Subcutaneous QHS  . lactulose  20 g Oral BID  . nadolol  20 mg Oral Daily  . pantoprazole (PROTONIX) IV  40 mg Intravenous Q12H  . polyvinyl alcohol  1 drop Both Eyes TID  . rifaximin  550 mg Oral BID  . sodium chloride flush  3 mL Intravenous Q12H  . spironolactone  25 mg Oral Daily   Infusions:   PRN: haloperidol lactate, hydrALAZINE, LORazepam, [DISCONTINUED] ondansetron **OR** ondansetron (ZOFRAN) IV Anti-infectives    Start     Dose/Rate Route Frequency Ordered Stop   08/21/17 2200  meropenem (MERREM) 1 g in sodium chloride 0.9 % 100 mL IVPB  Status:  Discontinued     1 g 200 mL/hr over 30 Minutes Intravenous Every 8 hours 08/21/17 1121 08/21/17 1427   08/21/17 2000  ciprofloxacin (CIPRO) tablet 500 mg     500 mg Oral 2 times daily 08/21/17 1427 08/23/17 0814   08/21/17 1500  rifaximin (XIFAXAN) tablet 550 mg     550 mg Oral 2 times daily 08/21/17 1347     08/21/17 1430  metroNIDAZOLE (FLAGYL) tablet 500 mg     500 mg Oral Every 8 hours 08/21/17 1427 08/23/17 0547   08/18/17 2200  levofloxacin (LEVAQUIN) IVPB 750 mg  Status:  Discontinued     750 mg 100 mL/hr over 90 Minutes Intravenous Every 48 hours 08/16/17 0243 08/16/17 0537   08/18/17 1000  meropenem (MERREM) 2 g in sodium chloride 0.9 % 100 mL IVPB  Status:  Discontinued     2 g 200 mL/hr over 30 Minutes Intravenous Every 12 hours 08/18/17 0837 08/21/17 1121   08/16/17 1500  aztreonam (AZACTAM) 1 g in dextrose 5 % 50 mL IVPB  Status:  Discontinued     1 g 100 mL/hr over 30 Minutes Intravenous Every 12 hours 08/16/17 0248 08/16/17 0537   08/16/17 1145  rifaximin (XIFAXAN) tablet 550 mg  Status:  Discontinued     550 mg Oral 2 times daily 08/16/17 1141 08/17/17 0844   08/16/17 0900  vancomycin (VANCOCIN) 1,500 mg in sodium chloride 0.9 % 500 mL IVPB  Status:   Discontinued     1,500 mg 250 mL/hr over 120 Minutes Intravenous Every 24 hours 08/16/17 0243 08/17/17 0844   08/16/17 0545  meropenem (MERREM) 1 g in sodium chloride 0.9 % 100 mL IVPB  Status:  Discontinued     1 g 200 mL/hr over 30 Minutes Intravenous Every 12 hours 08/16/17 0539 08/18/17 0837   08/16/17 0545  metroNIDAZOLE (FLAGYL) IVPB 500 mg  Status:  Discontinued     500 mg 100 mL/hr over 60 Minutes Intravenous Every 8 hours 08/16/17 0539 08/17/17 0844   08/16/17 0415  rifaximin (XIFAXAN) tablet 200 mg     200 mg Per Tube 2 times daily 08/16/17 0410 08/16/17 0506   08/16/17 0230  levofloxacin (LEVAQUIN) IVPB 750 mg     750 mg 100 mL/hr over 90 Minutes Intravenous  Once 08/16/17 0228 08/16/17 0405   08/16/17 0230  aztreonam (AZACTAM) 2 g in dextrose 5 % 50 mL IVPB     2 g 100 mL/hr over 30 Minutes Intravenous  Once 08/16/17 0228 08/16/17 0318   08/16/17 0230  vancomycin (VANCOCIN) IVPB 1000 mg/200 mL premix     1,000 mg 200 mL/hr  over 60 Minutes Intravenous  Once 08/16/17 0228 08/16/17 0407      Assessment: Potassium with am labs was 2.9. Replacing with KCl via iv. Will recheck 1 hour after last infusion. Also checking Mg+ level this afternoon, will discuss with Dr Amado Coe during rounds. Patient has hypernatremia, which I paged Dr Amado Coe about, let her know there was no electrolyte protocol for pharmacists to address hypernatremia. Recommended she manage or consult nephrology potentially for their input.  Goal of Therapy:  Electrolytes WNL through gentle adjustment   Plan:  KCL iv q1h x 4 doses, recheck K+ and Mg+ this afternoon. Discuss hypernatremia with Dr Amado Coe this afternoon.   9/25@1420 . Spoke with RN Baltazar Najjar because only 2 of 4 bags KCl have been administered, she states the rate has had to be slowed by significantly due to pt complaining of burning. Will reschedule K+ to 1800. Pharmacist to follow and replace if necessary.   9/25 1950 K 4.8. No further  supplement at this time. Recheck electrolytes with AM labs.   Lukus Binion A. [place], [place].D., BCPS Clinical Pharmacist [facility]   08/25/2017,8:37 PM

## 2017-08-25 NOTE — Progress Notes (Signed)
  Speech Language Pathology Treatment: Dysphagia  Patient Details Name: Jesus [name] MRN: [id number] DOB: [birth year] Today's Date: 08/25/2017 Time: 1100-1145 SLP Time Calculation (min) (ACUTE ONLY): 45 min  Assessment / Plan / Recommendation Clinical Impression  Pt was seen for ongoing assessment of po trials in order to upgrade liquid consistency to thin liquids; Pt has been tolerating Dysphagia 2 w/ Nectar thick consistency w/ no immediate overt s/s of aspiration per NSG and family.  Pt was given 3 trials of ice chips and 10 trials of thin liquid via cup (no straw). Pt exhibited mild coughing 1x, however, coughing did not increase in frequency or intensity throughout the remainder of the trials (unsure if related to po trials). Pt continued sip trials w/ OT after ST session and no further coughing has been noted. No immediate overt s/s of aspiration occurred other than mild cough 1x. Pt appeared to have a timely swallow and adequate oral clearing w/ lingual sweeping. No decline in vocal quality or respiratory status was noted during the session. Pt and wife were educated on general aspiration precautions - small sips/bites, eating slowly, no straws, and minimizing distractions/talking during meals.  Recommend upgrade to Dysphagia 2 w/ thin liquids via cup (NO STRAWS); strict aspiration precautions.  ST services will f/u w/ diet toleration, trials upgrade po's as able, and pt and family education.    HPI HPI: [name] admitted to the ICU with PEA arrest x 2 , acute on chronic anemia with prior evaluation at the [place] with no source of blood loss seen. H/o liver cirrhosis secondary to NASH. On this admission noted to have a possible shocked liver, on pressors, vominting blood, received blood. 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 Pt was emergently intubated at admission, extubated 08/18/17. Pt more alert today w/ speech clearer but continues to be  fidgity and impulsive requiring redirection to attend. Pt is tolerating the recommended dysphagia diet post eval yesterday per NSG and family report.      SLP Plan  Continue with current plan of care       Recommendations  Diet recommendations: Dysphagia 2 (fine chop);Thin liquid Liquids provided via: Cup;No straw Medication Administration: Whole meds with puree (as able) Supervision: Patient able to self feed;Staff to assist with self feeding;Full supervision/cueing for compensatory strategies Compensations: Minimize environmental distractions;Slow rate;Small sips/bites;Lingual sweep for clearance of pocketing;Follow solids with liquid Postural Changes and/or Swallow Maneuvers: Seated upright 90 degrees;Upright 30-60 min after meal                General recommendations:  (dietician f/u) Oral Care Recommendations: Oral care BID;Staff/trained caregiver to provide oral care Follow up Recommendations: Skilled Nursing facility SLP Visit Diagnosis: Dysphagia, oropharyngeal phase (R13.12) Plan: Continue with current plan of care       GO               Jesus [name], SLP-Graduate Student Jesus [name] 08/25/2017, 11:50 AM   This information has been reviewed and agreed upon by this supervising clinician.  This patient note, response to treatment and overall treatment plan has been reviewed and this clinician agrees with the information provided.  08/25/17, 2:45 PM [phone] Jesus Som, MS, CCC-SLP

## 2017-08-25 NOTE — Clinical Social Work Note (Addendum)
CSW contacted [facility] who said patient is not in network with insurance company.  CSW contacted [facility], and they said they are not contracted with [facility] either.  CSW updated patient's daughter Efraim Kaufmann, she is going to make some phone calls and contact CSW back.  Patient's daughter does not want patient to go to the facilities that did offer.  CSW to continue to follow patient's progress throughout discharge planning.  Ervin Knack. Duran Ohern, MSW, Theresia Majors [phone]  08/25/2017 9:40 AM

## 2017-08-25 NOTE — Progress Notes (Signed)
MEDICATION RELATED CONSULT NOTE - follow  Pharmacy Consult for electrolyte management Indication: hypokalemia  Allergies  Allergen Reactions  . Penicillins Hives and Other (See Comments)    Has patient had a PCN reaction causing immediate rash, facial/tongue/throat swelling, SOB or lightheadedness with hypotension: Yes Has patient had a PCN reaction causing severe rash involving mucus membranes or skin necrosis: No Has patient had a PCN reaction that required hospitalization: No Has patient had a PCN reaction occurring within the last 10 years: No If all of the above answers are "NO", then may proceed with Cephalosporin use.     Patient Measurements: Height:  (165.1 cm) Weight: (!) 302 lb 0.5 oz (137 kg) IBW/kg (Calculated) : 61.5  Vital Signs: Temp: 98.8 F (37.1 C) (09/25 1100) Temp Source: Oral (09/25 1100) BP: 136/62 (09/25 1100) Pulse Rate: 98 (09/25 1100) Intake/Output from previous day: 09/24 0701 - 09/25 0700 In: 635 [P.O.:485; IV Piggyback:150] Out: 1200 [Urine:1000; Stool:200] Intake/Output from this shift: No intake/output data recorded.  Labs:  Recent Labs  08/23/17 0602 08/24/17 0323 08/25/17 0518 08/25/17 1230  WBC  --  7.7  --   --   HGB  --  10.1*  --   --   HCT  --  30.9*  --   --   PLT  --  56*  --   --   CREATININE 0.97 1.18 1.06 1.14  MG  --   --  1.7  --   ALBUMIN 2.2*  --  2.4*  --   PROT 5.8*  --  5.7*  --   AST 80*  --  73*  --   ALT 248*  --  131*  --   ALKPHOS 97  --  97  --   BILITOT 10.4*  --  11.5*  --    Estimated Creatinine Clearance: 82.7 mL/min (by C-G formula based on SCr of 1.14 mg/dL).   Microbiology: Recent Results (from the past 720 hour(s))  Blood Culture (routine x 2)     Status: None   Collection Time: 08/16/17 12:50 AM  Result Value Ref Range Status   Specimen Description BLOOD LEFT ARM  Final   Special Requests   Final    BOTTLES DRAWN AEROBIC AND ANAEROBIC Blood Culture adequate volume   Culture NO  GROWTH 5 DAYS  Final   Report Status 08/21/2017 FINAL  Final  Blood Culture (routine x 2)     Status: None   Collection Time: 08/16/17 12:51 AM  Result Value Ref Range Status   Specimen Description BLOOD LEFT ANTECUBITAL  Final   Special Requests   Final    BOTTLES DRAWN AEROBIC AND ANAEROBIC Blood Culture adequate volume   Culture NO GROWTH 5 DAYS  Final   Report Status 08/21/2017 FINAL  Final  Urine culture     Status: Abnormal   Collection Time: 08/16/17 12:51 AM  Result Value Ref Range Status   Specimen Description URINE, RANDOM  Final   Special Requests NONE  Final   Culture MULTIPLE SPECIES PRESENT, SUGGEST RECOLLECTION (A)  Final   Report Status 08/17/2017 FINAL  Final  MRSA PCR Screening     Status: None   Collection Time: 08/16/17  4:56 AM  Result Value Ref Range Status   MRSA by PCR NEGATIVE NEGATIVE Final    Comment:        The GeneXpert MRSA Assay (FDA approved for NASAL specimens only), is one component of a comprehensive MRSA colonization surveillance program. It  is not intended to diagnose MRSA infection nor to guide or monitor treatment for MRSA infections.   Body fluid culture     Status: None (Preliminary result)   Collection Time: 08/25/17  9:04 AM  Result Value Ref Range Status   Specimen Description PERITONEAL  Final   Special Requests NONE  Final   Gram Stain   Final    RARE WBC PRESENT, PREDOMINANTLY MONONUCLEAR NO ORGANISMS SEEN Performed at [facility], [address] [place], [place] [address]    Culture PENDING  Incomplete   Report Status PENDING  Incomplete    Medical History: Past Medical History:  Diagnosis Date  . Cirrhosis of liver (HCC)   . Diabetes (HCC)     Medications:  Prescriptions Prior to Admission  Medication Sig Dispense Refill Last Dose  . aspirin 81 MG EC tablet Take 81 mg by mouth daily. Swallow whole.     . baclofen (LIORESAL) 10 MG tablet Take 10 mg by mouth 2 (two) times daily as needed for muscle spasms.      Marland Kitchen escitalopram (LEXAPRO) 10 MG tablet Take 20 mg by mouth daily.     Marland Kitchen glipiZIDE (GLUCOTROL) 5 MG tablet Take by mouth daily before breakfast.     . HYDROcodone-acetaminophen (NORCO/VICODIN) 5-325 MG tablet Take 1 tablet by mouth 2 (two) times daily as needed for moderate pain.     . hydrOXYzine (ATARAX/VISTARIL) 25 MG tablet Take 25 mg by mouth at bedtime as needed.     Marland Kitchen oxybutynin (DITROPAN) 5 MG tablet Take 5 mg by mouth 2 (two) times daily as needed for bladder spasms.     Marland Kitchen spironolactone (ALDACTONE) 25 MG tablet Take 25 mg by mouth daily.     . tamsulosin (FLOMAX) 0.4 MG CAPS capsule Take 0.4 mg by mouth daily.     . traZODone (DESYREL) 100 MG tablet Take 150 mg by mouth at bedtime.     . furosemide (LASIX) 40 MG tablet Take 40 mg by mouth 2 (two) times daily.    Past Week at Unknown time  . metFORMIN (GLUCOPHAGE) 1000 MG tablet Take 1,000 mg by mouth 2 (two) times daily with a meal.   Past Week at unknown  . pantoprazole (PROTONIX) 40 MG tablet Take 1 tablet (40 mg total) by mouth daily. 30 tablet 0   . primidone (MYSOLINE) 50 MG tablet Take 100 mg by mouth at bedtime.     . [DISCONTINUED] glyBURIDE (DIABETA) 5 MG tablet Take 5 mg by mouth daily.   Past Week at Unknown time  . [DISCONTINUED] insulin aspart (NOVOLOG) 100 UNIT/ML injection Inject 7 Units into the skin 3 (three) times daily with meals. 10 mL 11   . [DISCONTINUED] insulin glargine (LANTUS) 100 UNIT/ML injection Inject 0.34 mLs (34 Units total) into the skin daily. 10 mL 11   . [DISCONTINUED] lactulose (CHRONULAC) 10 GM/15ML solution Take 45 mLs (30 g total) by mouth daily. 240 mL 0   . [DISCONTINUED] levofloxacin (LEVAQUIN) 750 MG tablet Take 1 tablet (750 mg total) by mouth daily. 3 tablet 0   . [DISCONTINUED] traZODone (DESYREL) 50 MG tablet Take 75 mg by mouth at bedtime.    Past Week at Unknown time   Scheduled:  . insulin aspart  0-5 Units Subcutaneous QHS  . insulin aspart  0-9 Units Subcutaneous TID WC  .  insulin glargine  12 Units Subcutaneous QHS  . lactulose  20 g Oral BID  . nadolol  20 mg Oral  Daily  . pantoprazole (PROTONIX) IV  40 mg Intravenous Q12H  . polyvinyl alcohol  1 drop Both Eyes TID  . rifaximin  550 mg Oral BID  . sodium chloride flush  3 mL Intravenous Q12H  . spironolactone  25 mg Oral Daily   Infusions:  . dextrose 5 % and 0.45 % NaCl with KCl 20 mEq/L 50 mL/hr at 08/25/17 1200   PRN: haloperidol lactate, hydrALAZINE, LORazepam, [DISCONTINUED] ondansetron **OR** ondansetron (ZOFRAN) IV Anti-infectives    Start     Dose/Rate Route Frequency Ordered Stop   08/21/17 2200  meropenem (MERREM) 1 g in sodium chloride 0.9 % 100 mL IVPB  Status:  Discontinued     1 g 200 mL/hr over 30 Minutes Intravenous Every 8 hours 08/21/17 1121 08/21/17 1427   08/21/17 2000  ciprofloxacin (CIPRO) tablet 500 mg     500 mg Oral 2 times daily 08/21/17 1427 08/23/17 0814   08/21/17 1500  rifaximin (XIFAXAN) tablet 550 mg     550 mg Oral 2 times daily 08/21/17 1347     08/21/17 1430  metroNIDAZOLE (FLAGYL) tablet 500 mg     500 mg Oral Every 8 hours 08/21/17 1427 08/23/17 0547   08/18/17 2200  levofloxacin (LEVAQUIN) IVPB 750 mg  Status:  Discontinued     750 mg 100 mL/hr over 90 Minutes Intravenous Every 48 hours 08/16/17 0243 08/16/17 0537   08/18/17 1000  meropenem (MERREM) 2 g in sodium chloride 0.9 % 100 mL IVPB  Status:  Discontinued     2 g 200 mL/hr over 30 Minutes Intravenous Every 12 hours 08/18/17 0837 08/21/17 1121   08/16/17 1500  aztreonam (AZACTAM) 1 g in dextrose 5 % 50 mL IVPB  Status:  Discontinued     1 g 100 mL/hr over 30 Minutes Intravenous Every 12 hours 08/16/17 0248 08/16/17 0537   08/16/17 1145  rifaximin (XIFAXAN) tablet 550 mg  Status:  Discontinued     550 mg Oral 2 times daily 08/16/17 1141 08/17/17 0844   08/16/17 0900  vancomycin (VANCOCIN) 1,500 mg in sodium chloride 0.9 % 500 mL IVPB  Status:  Discontinued     1,500 mg 250 mL/hr over 120 Minutes  Intravenous Every 24 hours 08/16/17 0243 08/17/17 0844   08/16/17 0545  meropenem (MERREM) 1 g in sodium chloride 0.9 % 100 mL IVPB  Status:  Discontinued     1 g 200 mL/hr over 30 Minutes Intravenous Every 12 hours 08/16/17 0539 08/18/17 0837   08/16/17 0545  metroNIDAZOLE (FLAGYL) IVPB 500 mg  Status:  Discontinued     500 mg 100 mL/hr over 60 Minutes Intravenous Every 8 hours 08/16/17 0539 08/17/17 0844   08/16/17 0415  rifaximin (XIFAXAN) tablet 200 mg     200 mg Per Tube 2 times daily 08/16/17 0410 08/16/17 0506   08/16/17 0230  levofloxacin (LEVAQUIN) IVPB 750 mg     750 mg 100 mL/hr over 90 Minutes Intravenous  Once 08/16/17 0228 08/16/17 0405   08/16/17 0230  aztreonam (AZACTAM) 2 g in dextrose 5 % 50 mL IVPB     2 g 100 mL/hr over 30 Minutes Intravenous  Once 08/16/17 0228 08/16/17 0318   08/16/17 0230  vancomycin (VANCOCIN) IVPB 1000 mg/200 mL premix     1,000 mg 200 mL/hr over 60 Minutes Intravenous  Once 08/16/17 0228 08/16/17 0407      Assessment: Potassium with am labs was 2.9. Replacing with KCl via iv. Will recheck 1 hour  after last infusion. Also checking Mg+ level this afternoon, will discuss with Dr Amado Coe during rounds. Patient has hypernatremia, which I paged Dr Amado Coe about, let her know there was no electrolyte protocol for pharmacists to address hypernatremia. Recommended she manage or consult nephrology potentially for their input.  Goal of Therapy:  Electrolytes WNL through gentle adjustment   Plan:  KCL iv q1h x 4 doses, recheck K+ and Mg+ this afternoon. Discuss hypernatremia with Dr Amado Coe this afternoon.   9/25@1420 . Spoke with RN Baltazar Najjar because only 2 of 4 bags KCl have been administered, she states the rate has had to be slowed by significantly due to pt complaining of burning. Will reschedule K+ to 1800. Pharmacist to follow and replace if necessary.   Luan Pulling, PharmD, MBA, [employer] Clinical Pharmacist [facility]   08/25/2017,2:17 PM

## 2017-08-25 NOTE — Progress Notes (Signed)
[employer] - [place] at [facility]   PATIENT NAME: Jesus [name]    MR#:  [id number]  DATE OF BIRTH:  05-Jun-1951  SUBJECTIVE:  CHIEF COMPLAINT:   Chief Complaint  Patient presents with  . Altered Mental Status  . Hyperglycemia   -patient had paracentesis today, 4 L of fluid was taken out. Reassessed by speech therapy  Mental status is much improved. -Wants to take shower today  REVIEW OF SYSTEMS:  Review of Systems  Constitutional: Positive for malaise/fatigue. Negative for chills and fever.  HENT: Negative for congestion, ear discharge, hearing loss and nosebleeds.   Respiratory: Negative for cough, shortness of breath and wheezing.   Cardiovascular: Positive for leg swelling. Negative for chest pain and palpitations.  Gastrointestinal: Negative for abdominal pain, constipation, diarrhea, nausea and vomiting.  Genitourinary: Negative for dysuria.  Musculoskeletal: Negative for myalgias.  Neurological: Negative for dizziness, speech change, focal weakness, seizures and headaches.  Psychiatric/Behavioral: Negative for depression.    DRUG ALLERGIES:   Allergies  Allergen Reactions  . Penicillins Hives and Other (See Comments)    Has patient had a PCN reaction causing immediate rash, facial/tongue/throat swelling, SOB or lightheadedness with hypotension: Yes Has patient had a PCN reaction causing severe rash involving mucus membranes or skin necrosis: No Has patient had a PCN reaction that required hospitalization: No Has patient had a PCN reaction occurring within the last 10 years: No If all of the above answers are "NO", then may proceed with Cephalosporin use.     VITALS:  Blood pressure 136/62, pulse 98, temperature 98.8 F (37.1 C), temperature source Oral, resp. rate 20, height  (1.651 m), weight (!) 137 kg (302 lb 0.5 oz), SpO2 97 %.  PHYSICAL EXAMINATION:  Physical Exam  GENERAL:  66 y.o.-year-old obese patient lying in the bed with no  acute distress.  EYES: Pupils equal, round, reactive to light and accommodation. No scleral icterus. Extraocular muscles intact.  HEENT: Head atraumatic, normocephalic. Oropharynx and nasopharynx clear.  NECK:  Supple, no jugular venous distention. No thyroid enlargement, no tenderness.  LUNGS: Normal breath sounds bilaterally, no wheezing, rales,rhonchi or crepitation. No use of accessory muscles of respiration.  CARDIOVASCULAR: S1, S2 normal. No murmurs, rubs, or gallops.  ABDOMEN: Soft, nontender, very distended. Bowel sounds present. Rectal tube is present EXTREMITIES: 3+ pedal edema, all the way up to the abdominal wall noted. Scrotal edema No  cyanosis, or clubbing.  NEUROLOGIC: Cranial nerves II through XII are intact. Muscle strength 5/5 in all extremities. Sensation intact. Gait not checked. Global weakness noted. PSYCHIATRIC: The patient is alert and oriented x 3.  SKIN: No obvious rash, lesion, or ulcer.    LABORATORY PANEL:   CBC  Recent Labs Lab 08/24/17 0323  WBC 7.7  HGB 10.1*  HCT 30.9*  PLT 56*   ------------------------------------------------------------------------------------------------------------------  Chemistries   Recent Labs Lab 08/25/17 0518  NA 153*  K 2.9*  CL 120*  CO2 25  GLUCOSE 195*  BUN 31*  CREATININE 1.06  CALCIUM 8.5*  MG 1.7  AST 73*  ALT 131*  ALKPHOS 97  BILITOT 11.5*   ------------------------------------------------------------------------------------------------------------------  Cardiac Enzymes No results for input(s): TROPONINI in the last 168 hours. ------------------------------------------------------------------------------------------------------------------  RADIOLOGY:  US Abdomen Complete  Result Date: 08/24/2017 CLINICAL DATA:  Abdominal distension EXAM: ABDOMEN ULTRASOUND COMPLETE COMPARISON:  Abdominal ultrasound of August 17, 2017 and abdominal and pelvic CT scan of August 12, 2010. FINDINGS:  Gallbladder: The gallbladder is adequately distended. There is echogenic  sludge present as well as stones with the largest stone measuring 1.3 cm. The gallbladder wall is thickened measuring between 3.8 and 7.4 mm. Common bile duct: Diameter: 3.6 mm Liver: The hepatic echotexture is increased. The surface contour is nodular. There is no discrete mass or ductal dilation. No flow was visualized within the portal vein. IVC: No abnormality visualized. Pancreas: Bowel gas largely obscures the pancreatic head and tail. The visualized portions of the pancreatic body appear normal. Spleen: There is splenomegaly with splenic length of 19.5 cm and calculated volume of 1240 cc. Right Kidney: Length: 8.6 cm. Echogenicity within normal limits. No mass or hydronephrosis visualized. Left Kidney: Length: 12.4 cm. Echogenicity within normal limits. No mass or hydronephrosis visualized. Abdominal aorta: The abdominal aorta is largely obscured by bowel gas. Other findings: There is ascites. There are bilateral pleural effusions. IMPRESSION: Findings compatible with hepatic cirrhosis. No flow within the portal vein is observed. There is ascites as well as bilateral pleural effusions. There is marked splenomegaly. Gallstones and sludge. Gallbladder wall thickening to as much is 7.4 mm likely related to the ascites. No positive sonographic Murphy sign. Limited visualization of the pancreas. Chronically atrophic right kidney. Electronically Signed   By: David  [place] M.D.   On: 08/24/2017 09:48   US Paracentesis  Result Date: 08/25/2017 INDICATION: Patient with history of cirrhosis, ascites. Request is made for diagnostic and therapeutic paracentesis EXAM: ULTRASOUND GUIDED DIAGNOSTIC AND THERAPEUTIC PARACENTESIS MEDICATIONS: 10 mL 1% lidocaine COMPLICATIONS: None immediate. PROCEDURE: Informed written consent was obtained from the patient after a discussion of the risks, benefits and alternatives to treatment. A timeout was performed  prior to the initiation of the procedure. Initial ultrasound scanning demonstrates a moderate amount of ascites within the right lateral abdomen. The right lateral abdomen was prepped and draped in the usual sterile fashion. 1% lidocaine was used for local anesthesia. Following this, a 6 Fr Safe-T-Centesis catheter was introduced. An ultrasound image was saved for documentation purposes. The paracentesis was performed. The catheter was removed and a dressing was applied. The patient tolerated the procedure well without immediate post procedural complication. FINDINGS: A total of approximately 4.0 liters of yellow fluid was removed. Samples were sent to the laboratory as requested by the clinical team. IMPRESSION: Successful ultrasound-guided diagnostic and therapeutic paracentesis yielding 4.0 liters of peritoneal fluid. Read by:  Loyce Dys PA-C Electronically Signed   By: Alcide Clever M.D.   On: 08/25/2017 10:02    EKG:   Orders placed or performed during the hospital encounter of 08/16/17  . EKG 12-Lead  . EKG 12-Lead  . ED EKG 12-Lead  . ED EKG 12-Lead  . EKG 12-Lead  . EKG 12-Lead    ASSESSMENT AND PLAN:   66 year old male with past medical his presents to hospital secondary to altered mental status. Also had a cardiac arrest and was in ICU. 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 Hb stable - off octreotide drip. On Protonix twice a day -stable hemoglobin. -outpatient EGD in 2-3 weeks  # anasarca- secondary to liver failure. Albumin is low at 2.2.  - received IV albumin for 3 days - on Lasix and Aldactone. With Lasix, patient getting hypernatremic, also could be because he is on  nectar thick liquids. -ultrasound abdomen with liver cirrhosis, ? Portal vein thrombosis, spleenomegaly and ascites - US guided  paracentesis done today and 4 L of fluid removed - on empiric cipro and flagyl- stop after 10 days  # severe hyponatremia and hypokalemia Patient is started on D5 half-normal saline with 20 K Diet changed to dysphagia type II with thin liquids Oral potassium supplements Repeat electrolytes  # acute encephalopathy-anoxic from cardiac arrest, and also hepatic -improving mental status. Ammonia is slightly elevated. Rpt ammonia level  -Started on lactulose. - speech therapy to re-evaluate today as mentally much improved  #4 acute liver failure with history of liver cirrhosis-transaminases are improving, continue to monitor INR per GI recommendations. Bilirubin is still elevated. on rifaximin On nadolol-dose increased S/p paracentesis- 4 lit fluid removed 08/25/17  #5 DM- lantus, ssi Appreciate diabetes coordinator input  #6 DVT Prophylaxis- TEDS and SCDS   PT recommended SNF- patient prefers going home with home health, wife is agreeable Discussed with daughters- agree with long term hospice services   All the records are reviewed and case discussed with Care Management/Social Workerr. Management plans discussed with the patient, family and they are in agreement.  CODE STATUS: DNR  TOTAL TIME TAKING CARE OF THIS PATIENT: 35  minutes.   POSSIBLE D/C IN 2-3 DAYS, DEPENDING ON CLINICAL CONDITION.   Ramonita Lab M.D on 08/25/2017 at 1:11 PM  Between 7am to 6pm - Pager - [phone]  After 6pm go to [email or web address] - password [email or web address]  [facility] [place] [facility]  Office  [phone]  CC: Primary care physician; System, Pcp Not In

## 2017-08-25 NOTE — Progress Notes (Signed)
MEDICATION RELATED CONSULT NOTE - INITIAL   Pharmacy Consult for electrolyte management Indication: hypokalemia  Allergies  Allergen Reactions  . Penicillins Hives and Other (See Comments)    Has patient had a PCN reaction causing immediate rash, facial/tongue/throat swelling, SOB or lightheadedness with hypotension: Yes Has patient had a PCN reaction causing severe rash involving mucus membranes or skin necrosis: No Has patient had a PCN reaction that required hospitalization: No Has patient had a PCN reaction occurring within the last 10 years: No If all of the above answers are "NO", then may proceed with Cephalosporin use.     Patient Measurements: Height:  (165.1 cm) Weight: (!) 302 lb 0.5 oz (137 kg) IBW/kg (Calculated) : 61.5  Vital Signs: Temp: 98.1 F (36.7 C) (09/25 0405) Temp Source: Oral (09/25 0405) BP: 134/64 (09/25 0900) Pulse Rate: 98 (09/25 0900) Intake/Output from previous day: 09/24 0701 - 09/25 0700 In: 635 [P.O.:485; IV Piggyback:150] Out: 1200 [Urine:1000; Stool:200] Intake/Output from this shift: No intake/output data recorded.  Labs:  Recent Labs  08/23/17 0602 08/24/17 0323 08/25/17 0518  WBC  --  7.7  --   HGB  --  10.1*  --   HCT  --  30.9*  --   PLT  --  56*  --   CREATININE 0.97 1.18 1.06  MG  --   --  1.7  ALBUMIN 2.2*  --  2.4*  PROT 5.8*  --  5.7*  AST 80*  --  73*  ALT 248*  --  131*  ALKPHOS 97  --  97  BILITOT 10.4*  --  11.5*   Estimated Creatinine Clearance: 88.9 mL/min (by C-G formula based on SCr of 1.06 mg/dL).   Microbiology: Recent Results (from the past 720 hour(s))  Blood Culture (routine x 2)     Status: None   Collection Time: 08/16/17 12:50 AM  Result Value Ref Range Status   Specimen Description BLOOD LEFT ARM  Final   Special Requests   Final    BOTTLES DRAWN AEROBIC AND ANAEROBIC Blood Culture adequate volume   Culture NO GROWTH 5 DAYS  Final   Report Status 08/21/2017 FINAL  Final  Blood Culture  (routine x 2)     Status: None   Collection Time: 08/16/17 12:51 AM  Result Value Ref Range Status   Specimen Description BLOOD LEFT ANTECUBITAL  Final   Special Requests   Final    BOTTLES DRAWN AEROBIC AND ANAEROBIC Blood Culture adequate volume   Culture NO GROWTH 5 DAYS  Final   Report Status 08/21/2017 FINAL  Final  Urine culture     Status: Abnormal   Collection Time: 08/16/17 12:51 AM  Result Value Ref Range Status   Specimen Description URINE, RANDOM  Final   Special Requests NONE  Final   Culture MULTIPLE SPECIES PRESENT, SUGGEST RECOLLECTION (A)  Final   Report Status 08/17/2017 FINAL  Final  MRSA PCR Screening     Status: None   Collection Time: 08/16/17  4:56 AM  Result Value Ref Range Status   MRSA by PCR NEGATIVE NEGATIVE Final    Comment:        The GeneXpert MRSA Assay (FDA approved for NASAL specimens only), is one component of a comprehensive MRSA colonization surveillance program. It is not intended to diagnose MRSA infection nor to guide or monitor treatment for MRSA infections.     Medical History: Past Medical History:  Diagnosis Date  . Cirrhosis of liver (HCC)   .  Diabetes (HCC)     Medications:  Prescriptions Prior to Admission  Medication Sig Dispense Refill Last Dose  . aspirin 81 MG EC tablet Take 81 mg by mouth daily. Swallow whole.     . baclofen (LIORESAL) 10 MG tablet Take 10 mg by mouth 2 (two) times daily as needed for muscle spasms.     Marland Kitchen escitalopram (LEXAPRO) 10 MG tablet Take 20 mg by mouth daily.     Marland Kitchen glipiZIDE (GLUCOTROL) 5 MG tablet Take by mouth daily before breakfast.     . HYDROcodone-acetaminophen (NORCO/VICODIN) 5-325 MG tablet Take 1 tablet by mouth 2 (two) times daily as needed for moderate pain.     . hydrOXYzine (ATARAX/VISTARIL) 25 MG tablet Take 25 mg by mouth at bedtime as needed.     Marland Kitchen oxybutynin (DITROPAN) 5 MG tablet Take 5 mg by mouth 2 (two) times daily as needed for bladder spasms.     Marland Kitchen spironolactone  (ALDACTONE) 25 MG tablet Take 25 mg by mouth daily.     . tamsulosin (FLOMAX) 0.4 MG CAPS capsule Take 0.4 mg by mouth daily.     . traZODone (DESYREL) 100 MG tablet Take 150 mg by mouth at bedtime.     . furosemide (LASIX) 40 MG tablet Take 40 mg by mouth 2 (two) times daily.    Past Week at Unknown time  . metFORMIN (GLUCOPHAGE) 1000 MG tablet Take 1,000 mg by mouth 2 (two) times daily with a meal.   Past Week at unknown  . pantoprazole (PROTONIX) 40 MG tablet Take 1 tablet (40 mg total) by mouth daily. 30 tablet 0   . primidone (MYSOLINE) 50 MG tablet Take 100 mg by mouth at bedtime.     . [DISCONTINUED] glyBURIDE (DIABETA) 5 MG tablet Take 5 mg by mouth daily.   Past Week at Unknown time  . [DISCONTINUED] insulin aspart (NOVOLOG) 100 UNIT/ML injection Inject 7 Units into the skin 3 (three) times daily with meals. 10 mL 11   . [DISCONTINUED] insulin glargine (LANTUS) 100 UNIT/ML injection Inject 0.34 mLs (34 Units total) into the skin daily. 10 mL 11   . [DISCONTINUED] lactulose (CHRONULAC) 10 GM/15ML solution Take 45 mLs (30 g total) by mouth daily. 240 mL 0   . [DISCONTINUED] levofloxacin (LEVAQUIN) 750 MG tablet Take 1 tablet (750 mg total) by mouth daily. 3 tablet 0   . [DISCONTINUED] traZODone (DESYREL) 50 MG tablet Take 75 mg by mouth at bedtime.    Past Week at Unknown time   Scheduled:  . insulin aspart  0-5 Units Subcutaneous QHS  . insulin aspart  0-9 Units Subcutaneous TID WC  . insulin glargine  12 Units Subcutaneous QHS  . lactulose  20 g Oral BID  . nadolol  20 mg Oral Daily  . pantoprazole (PROTONIX) IV  40 mg Intravenous Q12H  . polyvinyl alcohol  1 drop Both Eyes TID  . potassium chloride  40 mEq Oral Q4H  . rifaximin  550 mg Oral BID  . sodium chloride flush  3 mL Intravenous Q12H  . spironolactone  25 mg Oral Daily   Infusions:  . albumin human 0 g (08/23/17 1558)  . dextrose 5 % and 0.45 % NaCl with KCl 20 mEq/L    . potassium chloride     PRN: haloperidol  lactate, hydrALAZINE, LORazepam, [DISCONTINUED] ondansetron **OR** ondansetron (ZOFRAN) IV Anti-infectives    Start     Dose/Rate Route Frequency Ordered Stop   08/21/17 2200  meropenem (MERREM)  1 g in sodium chloride 0.9 % 100 mL IVPB  Status:  Discontinued     1 g 200 mL/hr over 30 Minutes Intravenous Every 8 hours 08/21/17 1121 08/21/17 1427   08/21/17 2000  ciprofloxacin (CIPRO) tablet 500 mg     500 mg Oral 2 times daily 08/21/17 1427 08/23/17 0814   08/21/17 1500  rifaximin (XIFAXAN) tablet 550 mg     550 mg Oral 2 times daily 08/21/17 1347     08/21/17 1430  metroNIDAZOLE (FLAGYL) tablet 500 mg     500 mg Oral Every 8 hours 08/21/17 1427 08/23/17 0547   08/18/17 2200  levofloxacin (LEVAQUIN) IVPB 750 mg  Status:  Discontinued     750 mg 100 mL/hr over 90 Minutes Intravenous Every 48 hours 08/16/17 0243 08/16/17 0537   08/18/17 1000  meropenem (MERREM) 2 g in sodium chloride 0.9 % 100 mL IVPB  Status:  Discontinued     2 g 200 mL/hr over 30 Minutes Intravenous Every 12 hours 08/18/17 0837 08/21/17 1121   08/16/17 1500  aztreonam (AZACTAM) 1 g in dextrose 5 % 50 mL IVPB  Status:  Discontinued     1 g 100 mL/hr over 30 Minutes Intravenous Every 12 hours 08/16/17 0248 08/16/17 0537   08/16/17 1145  rifaximin (XIFAXAN) tablet 550 mg  Status:  Discontinued     550 mg Oral 2 times daily 08/16/17 1141 08/17/17 0844   08/16/17 0900  vancomycin (VANCOCIN) 1,500 mg in sodium chloride 0.9 % 500 mL IVPB  Status:  Discontinued     1,500 mg 250 mL/hr over 120 Minutes Intravenous Every 24 hours 08/16/17 0243 08/17/17 0844   08/16/17 0545  meropenem (MERREM) 1 g in sodium chloride 0.9 % 100 mL IVPB  Status:  Discontinued     1 g 200 mL/hr over 30 Minutes Intravenous Every 12 hours 08/16/17 0539 08/18/17 0837   08/16/17 0545  metroNIDAZOLE (FLAGYL) IVPB 500 mg  Status:  Discontinued     500 mg 100 mL/hr over 60 Minutes Intravenous Every 8 hours 08/16/17 0539 08/17/17 0844   08/16/17 0415   rifaximin (XIFAXAN) tablet 200 mg     200 mg Per Tube 2 times daily 08/16/17 0410 08/16/17 0506   08/16/17 0230  levofloxacin (LEVAQUIN) IVPB 750 mg     750 mg 100 mL/hr over 90 Minutes Intravenous  Once 08/16/17 0228 08/16/17 0405   08/16/17 0230  aztreonam (AZACTAM) 2 g in dextrose 5 % 50 mL IVPB     2 g 100 mL/hr over 30 Minutes Intravenous  Once 08/16/17 0228 08/16/17 0318   08/16/17 0230  vancomycin (VANCOCIN) IVPB 1000 mg/200 mL premix     1,000 mg 200 mL/hr over 60 Minutes Intravenous  Once 08/16/17 0228 08/16/17 0407      Assessment: Potassium with am labs was 2.9. Replacing with KCl via iv. Will recheck 1 hour after last infusion. Also checking Mg+ level this afternoon, will discuss with Dr Amado Coe during rounds. Patient has hypernatremia, which I paged Dr Amado Coe about, let her know there was no electrolyte protocol for pharmacists to address hypernatremia. Recommended she manage or consult nephrology potentially for their input.  Goal of Therapy:  Electrolytes WNL through gentle adjustment   Plan:  KCL iv q1h x 4 doses, recheck K+ and Mg+ this afternoon. Discuss hypernatremia with Dr Amado Coe this afternoon.   Luan Pulling, PharmD, MBA, [employer] Clinical Pharmacist [facility]   08/25/2017,9:37 AM

## 2017-08-25 NOTE — Plan of Care (Signed)
Problem: Safety: Goal: Ability to remain free from injury will improve Outcome: Progressing Assisted to shower this noon time.  tol well up to chair  For  Good amt of time this pm . tol well.  Problem: Skin Integrity: Goal: Risk for impaired skin integrity will decrease Outcome: Progressing Scrotal sac  Cont swollen.  Cleansed several times  And  Powder  Applied.  Elevated on support to help relieve pressure.  Bottom reddened from stools. Cleansed gently and powder applied.   Problem: Fluid Volume: Goal: Ability to maintain a balanced intake and output will improve Outcome: Progressing k iv and po for low k.  ivfs started.  Problem: Nutrition: Goal: Adequate nutrition will be maintained Outcome: Progressing Eating better dietician in to see today pt  Likes magic cups  Problem: Bowel/Gastric: Goal: Will not experience complications related to bowel motility Outcome: Progressing Rectal tube leaks at times.  Loose stools  Pt tol  Lactulose. [place] improved

## 2017-08-25 NOTE — Plan of Care (Signed)
Problem: Tissue Perfusion: Goal: Risk factors for ineffective tissue perfusion will decrease Outcome: Progressing Albumin cont k low  Supplemented with po and iv k  Problem: Activity: Goal: Risk for activity intolerance will decrease Outcome: Progressing oob per p.t. Into chair tol well  Problem: Fluid Volume: Goal: Ability to maintain a balanced intake and output will improve Outcome: Progressing US guided paracentesis performed 4 liters drained

## 2017-08-25 NOTE — Procedures (Signed)
PROCEDURE SUMMARY:  Successful US guided paracentesis from right lateral abdomen.  Yielded 4.0 liters of yellow fluid.  No immediate complications.  Pt tolerated well.   Specimen was sent for labs.  Hoyt Koch PA-C 08/25/2017 9:57 AM

## 2017-08-25 NOTE — Progress Notes (Signed)
Occupational Therapy Treatment Patient Details Name: Jesus [name] MRN: [id number] DOB: 1951-01-06 Today's Date: 08/25/2017    History of present illness (P) Pt admitted for cardiac arrest with complaints of AMS and hematemesis. PMH of cirrhosis, chronic iron deficiency, DM, hyperglycemia, cardiac arrest. Pt had two episodes of cardiac arrest in ED, went through 2 rounds of CPR, intubated from 9/16-9/18. Had severe GI bleed post EGD on 9/17, required multiple transfusions.   OT comments  Pt seen for OT treatment session this date. Pt up in recliner, much more alert and oriented this date. Wife noting pt is much more "like himself" the past 2 days. Pt demonstrated ability to self feed and drink from straw without assistance and no spillage. Pt able to independently manage recliner leg rest lever and use phone to call nursing and dining services. Pt provided with options for thin liquids for lunch, able to verbalize his needs/wants clearly with multiple choices. Pt eager to get back to a normal diet, as SLP just upgraded liquids to thin. Pt/family educated in use of phone/call bell. Mobility deferred due to pt's rectal tube with leak onto floor next to recliner. Nurse tech notified and in at end of session to assess and clean up. Continue to progress towards pt's OT goals. STR still appropriate. Will continue to assess for HHOT needs.   Follow Up Recommendations  SNF    Equipment Recommendations       Recommendations for Other Services      Precautions / Restrictions Precautions Precautions: Fall Restrictions Weight Bearing Restrictions: No       Mobility Bed Mobility           General bed mobility comments: deferred, pt up in recliner for start of session, previously assisted to recliner from bed by PT, per pt report  Transfers                 General transfer comment: deferred, pt with leaking rectal tube, nurse tech notified; up in chair prior to lunch    Balance                                            ADL either performed or assessed with clinical judgement   ADL Overall ADL's : Needs assistance/impaired Eating/Feeding: Set up Eating/Feeding Details (indicate cue type and reason): Pt now able to self feed without assistance both foods (minced foods; thin liquids) and drinks without difficulty or spillage and no adaptive utensils needed. Grooming: Set up;Sitting   Upper Body Bathing: Minimal assistance   Lower Body Bathing: Maximal assistance   Upper Body Dressing : Minimal assistance   Lower Body Dressing: Maximal assistance     Toilet Transfer Details (indicate cue type and reason): unable to attempt, pt with rectal tube, currently leaking during session, nurse tech notified.                 Vision Baseline Vision/History: Wears glasses     Perception     Praxis      Cognition Arousal/Alertness: Awake/alert Behavior During Therapy: WFL for tasks assessed/performed Overall Cognitive Status: Within Functional Limits for tasks assessed                                 General Comments: pt much more alert, oriented, and able to express  needs and follow commands consistently.        Exercises     Shoulder Instructions       General Comments      Pertinent Vitals/ Pain       Pain Assessment: No/denies pain  Home Living                                          Prior Functioning/Environment              Frequency  Min 1X/week        Progress Toward Goals  OT Goals(current goals can now be found in the care plan section)  Progress towards OT goals: Progressing toward goals  Acute Rehab OT Goals Patient Stated Goal: "to be able to take care of self and live my life." OT Goal Formulation: With patient Time For Goal Achievement: 09/05/17 Potential to Achieve Goals: Good  Plan Discharge plan remains appropriate;Frequency remains appropriate     Co-evaluation                 AM-PAC PT "6 Clicks" Daily Activity     Outcome Measure   Help from another person eating meals?: None Help from another person taking care of personal grooming?: None Help from another person toileting, which includes using toliet, bedpan, or urinal?: A Lot Help from another person bathing (including washing, rinsing, drying)?: A Lot Help from another person to put on and taking off regular upper body clothing?: A Little Help from another person to put on and taking off regular lower body clothing?: A Lot 6 Click Score: 17    End of Session    OT Visit Diagnosis: Muscle weakness (generalized) (M62.81)   Activity Tolerance Patient tolerated treatment well   Patient Left in chair;with call bell/phone within reach;with chair alarm set   Nurse Communication Other (comment) (notified nurse tech of leaking rectal tube)        Time: 7829-5621 OT Time Calculation (min): 14 min  Charges: OT General Charges $OT Visit: 1 Visit OT Treatments $Self Care/Home Management : 8-22 mins  Richrd Prime, MPH, MS, OTR/L ascom [phone] 08/25/17, 1:59 PM

## 2017-08-25 NOTE — Care Management (Addendum)
Spoke with patient's wife about discharge dispositions.  Physical therapy recommended skilled nursing facility  at the time of their evaluation on 9/21.  Anticipate reassessment today.  Patient had paracentesis today with removal of 4 liters.  Discussed the physical demands of care and the lack of available in home caregivers.  Wife acknowledges that she would not be able to prevent patient from falling or get him up out of the floor. Patient with frequent falls prior to this admission.  Discussed benefits of skilled nursing and when attending deems patient medially stable and declines skilled nursing bed offers, must plan to discharge home with home health and assume responsibility for care.  Would need to make decisions on home DME. Provided a list of home health agencies and denoted those who are in network with patient's [employer]

## 2017-08-25 NOTE — Progress Notes (Signed)
Nutrition Follow-up  DOCUMENTATION CODES:   Morbid obesity  INTERVENTION:  Continue Magic cup TID with meals, each supplement provides 290 kcal and 9 grams of protein.  Provide Ensure Enlive po TID, each supplement provides 350 kcal and 20 grams of protein.  Encouraged patient to eat a good source of protein at each meal. Discussed importance of meeting calorie and protein needs to prevent loss of lean body mass.  NUTRITION DIAGNOSIS:   Inadequate oral intake related to lethargy/confusion as evidenced by per patient/family report.  Ongoing - addressing with [employer] and Ensure Enlive.  GOAL:   Patient will meet greater than or equal to 90% of their needs  Progressing.  MONITOR:   PO intake, Supplement acceptance, Diet advancement, Labs, Weight trends, Skin, I & O's  REASON FOR ASSESSMENT:   Ventilator    ASSESSMENT:   66 year old male with PMHx of DM type 2, cirrhosis who presented with AMS after being found unresponsive, and also had hematemesis. Suffered PEA arrest in ED and underwent 40 minutes ACLS and was emergently intubated 9/16.  -Pt underwent US guided paracentesis today (4 liters yellow fluid removed). -Following SLP evaluation patient was advanced to thin liquids. Remains on dysphagia 2 diet.  Met with patient and wife in room. Patient sitting up in chair. Appears jaundiced. He reports his appetite is starting to slowly improve. He is excited he is able to drink thin liquids now as he did not like thickened liquids. He reports he enjoys the [employer] coming on tray and usually eats that along with banana pudding, which he also enjoys. He has not been eating much protein or main item on tray. Patient is amenable to drink Ensure to help meet his calorie and protein needs as his appetite is improving.  Meal Completion: bites to 25%  Medications reviewed and include: Novolog 0-9 units TID, Novolog 0-5 units QHS, Lantus 12 units QHS, Lactulose, pantoprazole,  D5-1/2NS with KCl 20 mEq/L @ 50 ml/hr (60 grams dextrose, 204 kcal daily).  Labs reviewed: CBG 233, Sodium 152, Potassium 2.9, BUN 27.  Weight trend: 137 kg on 9/22 (+7.4 kg from 9/18; likely has decreased now following paracentesis).  Discussed with RN.  Diet Order:  DIET DYS 2 Room service appropriate? Yes with Assist; Fluid consistency: Thin  Skin:  Wound (see comment) (MSAD to abdomen and groin)  Last BM:  08/25/2017 - large type 7 per rectal tube  Height:   Ht Readings from Last 1 Encounters:  08/22/17 5' 5"  (1.651 m)    Weight:   Wt Readings from Last 1 Encounters:  08/22/17 (!) 302 lb 0.5 oz (137 kg)    Ideal Body Weight:  70 kg (calculated with ht of 5' 8"  from previous admission)  BMI:  Body mass index is 50.26 kg/m.  Estimated Nutritional Needs:   Kcal:  2260-2465 (MSJ x 1.1-1.2)  Protein:  130-140 grams (0.9-1.1 grams/kg)  Fluid:  2.1 L/day (30 ml/kg IBW)  EDUCATION NEEDS:   No education needs identified at this time  Willey Blade, MS, RD, LDN Pager: [phone] After Hours Pager: [phone]

## 2017-08-25 NOTE — Progress Notes (Addendum)
SLP Cancellation Note  Patient Details Name: LAVORIS SPARLING MRN: [id number] DOB: Apr 16, 1951   Cancelled treatment:       Reason Eval/Treat Not Completed: Patient at procedure or test/unavailable (chart reviewed; consulted NSG. Pt is out of room.).  Pt is having a paracentesis this morning. Will f/u w/ po trials/asessment to hopefully upgrade diet this PM and/or next 1-2 days. Pt is tolerating current diet w/ pleasure ice chips all following aspiration precautions w/ NSG. MD updated and agreed.    [name], MS, CCC-SLP Watson,Katherine 08/25/2017, 8:49 AM

## 2017-08-25 NOTE — Progress Notes (Signed)
Physical Therapy Treatment Patient Details Name: Jesus [name] MRN: [id number] DOB: 04-29-1951 Today's Date: 08/25/2017    History of Present Illness Pt admitted for cardiac arrest with complaints of AMS and hematemesis. PMH of cirrhosis, chronic iron deficiency, DM, hyperglycemia, cardiac arrest. Pt had two episodes of cardiac arrest in ED, went through 2 rounds of CPR, intubated from 9/16-9/18. Had severe GI bleed post EGD on 9/17, required multiple transfusions.    PT Comments    Pt is making good progress towards goals. Pt able to rise from supine to sit EOB with mod assist, transfer from EOB to standing with RW with min assist, and amb 2 ft from bed to recliner with RW with mod assist. However, utilized +2 assist throughout session for safety and to keep catheter and rectal tube out of the way during mobility activities. Pt able to re-position himself when seated at EOB and in recliner to find comfortable position due to swollen scrotum and rectal tube. No noted LOB. Pt appeared motivated to participate in all PT activities. Continue to progress with bed mobility, transfers and amb with RW.    Follow Up Recommendations  SNF     Equipment Recommendations  Rolling walker with 5" wheels    Recommendations for Other Services       Precautions / Restrictions Precautions Precautions: Fall Restrictions Weight Bearing Restrictions: No    Mobility  Bed Mobility Overal bed mobility: Needs Assistance Bed Mobility: Supine to Sit Rolling: (P) Max assist   Supine to sit: Mod assist;+2 for safety/equipment     General bed mobility comments: Pt able to rise from supine to sit EOB with mod assist, with extensive cuing for UE/LE positioning and assist to pull trunk up from bed. Pt unable to get a good grasp on bedrails, held onto my hands to pull his trunk up and pushed his elbow into the bed to sit up. Pt cued to bring both legs off the bed, used chuck to help move hips closer to EOB to  avoid removing rectal tube. +2 assist to safely position pt at EOB and hold onto catheter and rectal tube. Pt performed rolling towards both sides 2x each with max assist, able to initiate rolling but has difficulty transferring bodyweight to sidelying.  Transfers Overall transfer level: Needs assistance Equipment used: Rolling walker (2 wheeled) Transfers: Sit to/from Stand Sit to Stand: Min assist;+2 safety/equipment         General transfer comment: Pt able to rise from seated EOB to standing with RW with min assist. Provided cues for proper sequencing, pt able to follow commands and stand with min. cues  Ambulation/Gait Ambulation/Gait assistance: (P) +2 safety/equipment;Mod assist (catheter and rectal tube) Ambulation Distance (Feet): 2 Feet Assistive device: Rolling walker (2 wheeled) Gait Pattern/deviations: Step-to pattern     General Gait Details: Pt able to amb with RW with min assist, however +2 for safety and to keep catheter and rectal tubes out of the way. Pt amb with small steps, able to maneuver RW. No LOB or dizziness. Pt amb over cushion mat that was placed on the floor next to the bed.     Stairs            Wheelchair Mobility    Modified Rankin (Stroke Patients Only)       Balance Overall balance assessment: Needs assistance Sitting-balance support: Feet supported Sitting balance-Leahy Scale: Good Sitting balance - Comments: Pt able to sit at EOB with min assist, requires mod assist to  get into position but once in seated, is able to maintain position. Pt able to re-position himself at EOB to sit more comfortably. Grandkids came by to visit, pt able to give grandkids a hug but appeared to have slightly decreased trunk control.   Standing balance support: Bilateral upper extremity supported Standing balance-Leahy Scale: Good Standing balance comment: Pt able to stand at EOB with RW with min assist, however +2 for safetly. No cues needed. No LOB or  dizziness.                             Cognition Arousal/Alertness: Awake/alert Behavior During Therapy: WFL for tasks assessed/performed Overall Cognitive Status: Within Functional Limits for tasks assessed                                 General Comments:    Exercises Other Exercises Other Exercises: Supine ther-ex performed both sides 10x, ankle pumps, SLRs, hip abd-add, shoulder raises. Provided cues for proper technique, no assist needed.     General Comments        Pertinent Vitals/Pain Pain Assessment: No/denies pain    Home Living                      Prior Function            PT Goals (current goals can now be found in the care plan section) Acute Rehab PT Goals Patient Stated Goal: "to be able to sit up in a chair." PT Goal Formulation: With patient Progress towards PT goals: Progressing toward goals    Frequency    Min 2X/week      PT Plan Current plan remains appropriate    Co-evaluation              AM-PAC PT "6 Clicks" Daily Activity  Outcome Measure  Difficulty turning over in bed (including adjusting bedclothes, sheets and blankets)?: Unable Difficulty moving from lying on back to sitting on the side of the bed? : Unable Difficulty sitting down on and standing up from a chair with arms (e.g., wheelchair, bedside commode, etc,.)?: Unable Help needed moving to and from a bed to chair (including a wheelchair)?: A Little Help needed walking in hospital room?: A Lot Help needed climbing 3-5 steps with a railing? : Total 6 Click Score: 9    End of Session Equipment Utilized During Treatment: Gait belt Activity Tolerance: Patient tolerated treatment well Patient left: in chair;with call bell/phone within reach;with chair alarm set Nurse Communication: Mobility status PT Visit Diagnosis: Other abnormalities of gait and mobility (R26.89);Muscle weakness (generalized) (M62.81)     Time: 1027-2536 PT Time  Calculation (min) (ACUTE ONLY): 46 min  Charges:                       G Codes:  Functional Assessment Tool Used: AM-PAC 6 Clicks Basic Mobility Functional Limitation: Mobility: Walking and moving around Mobility: Walking and Moving Around Current Status ([id number]): At least 60 percent but less than 80 percent impaired, limited or restricted Mobility: Walking and Moving Around Goal Status [phone]): At least 40 percent but less than 60 percent impaired, limited or restricted    Renford Dills, SPT   Renford Dills 08/25/2017, 2:28 PM

## 2017-08-26 DIAGNOSIS — Z7189 Other specified counseling: Secondary | ICD-10-CM

## 2017-08-26 DIAGNOSIS — Z515 Encounter for palliative care: Secondary | ICD-10-CM

## 2017-08-26 LAB — MISC LABCORP TEST (SEND OUT): Labcorp test code: 19588

## 2017-08-26 LAB — COMPREHENSIVE METABOLIC PANEL
ALT: 105 U/L — ABNORMAL HIGH (ref 17–63)
AST: 95 U/L — ABNORMAL HIGH (ref 15–41)
Albumin: 2.2 g/dL — ABNORMAL LOW (ref 3.5–5.0)
Alkaline Phosphatase: 104 U/L (ref 38–126)
Anion gap: 6 (ref 5–15)
BUN: 22 mg/dL — ABNORMAL HIGH (ref 6–20)
CHLORIDE: 114 mmol/L — AB (ref 101–111)
CO2: 23 mmol/L (ref 22–32)
Calcium: 8.2 mg/dL — ABNORMAL LOW (ref 8.9–10.3)
Creatinine, Ser: 0.97 mg/dL (ref 0.61–1.24)
Glucose, Bld: 242 mg/dL — ABNORMAL HIGH (ref 65–99)
POTASSIUM: 3.8 mmol/L (ref 3.5–5.1)
Sodium: 143 mmol/L (ref 135–145)
Total Bilirubin: 12.4 mg/dL — ABNORMAL HIGH (ref 0.3–1.2)
Total Protein: 5.7 g/dL — ABNORMAL LOW (ref 6.5–8.1)

## 2017-08-26 LAB — GLUCOSE, CAPILLARY
GLUCOSE-CAPILLARY: 125 mg/dL — AB (ref 65–99)
GLUCOSE-CAPILLARY: 192 mg/dL — AB (ref 65–99)
GLUCOSE-CAPILLARY: 220 mg/dL — AB (ref 65–99)
Glucose-Capillary: 283 mg/dL — ABNORMAL HIGH (ref 65–99)

## 2017-08-26 LAB — BASIC METABOLIC PANEL
Anion gap: 6 (ref 5–15)
BUN: 22 mg/dL — AB (ref 6–20)
CALCIUM: 8.4 mg/dL — AB (ref 8.9–10.3)
CO2: 25 mmol/L (ref 22–32)
CREATININE: 1.02 mg/dL (ref 0.61–1.24)
Chloride: 118 mmol/L — ABNORMAL HIGH (ref 101–111)
GFR calc Af Amer: >60 mL/min (ref >60)
GLUCOSE: 150 mg/dL — AB (ref 65–99)
Potassium: 3.4 mmol/L — ABNORMAL LOW (ref 3.5–5.1)
Sodium: 149 mmol/L — ABNORMAL HIGH (ref 135–145)

## 2017-08-26 LAB — CYTOLOGY - NON PAP

## 2017-08-26 LAB — MAGNESIUM: Magnesium: 1.7 mg/dL (ref 1.7–2.4)

## 2017-08-26 LAB — AMMONIA: Ammonia: 35 umol/L (ref 9–35)

## 2017-08-26 MED ORDER — TRAZODONE HCL 50 MG PO TABS
50.0000 mg | ORAL_TABLET | Freq: Every day | ORAL | Status: DC
  Administered 2017-08-26: 50 mg via ORAL
  Filled 2017-08-26: qty 1

## 2017-08-26 MED ORDER — NAPHAZOLINE-GLYCERIN 0.012-0.2 % OP SOLN
1.0000 [drp] | Freq: Four times a day (QID) | OPHTHALMIC | Status: DC
  Administered 2017-08-26: 1 [drp] via OPHTHALMIC
  Administered 2017-08-27 (×3): 2 [drp] via OPHTHALMIC
  Administered 2017-08-27: 1 [drp] via OPHTHALMIC
  Administered 2017-08-28: 2 [drp] via OPHTHALMIC
  Administered 2017-08-28: 18:00:00 1 [drp] via OPHTHALMIC
  Administered 2017-08-28 (×2): 2 [drp] via OPHTHALMIC
  Administered 2017-08-29 (×2): 1 [drp] via OPHTHALMIC
  Administered 2017-08-29: 22:00:00 2 [drp] via OPHTHALMIC
  Administered 2017-08-29: 1 [drp] via OPHTHALMIC
  Administered 2017-08-30: 09:00:00 2 [drp] via OPHTHALMIC
  Administered 2017-08-30 – 2017-09-01 (×8): 1 [drp] via OPHTHALMIC
  Administered 2017-09-01: 10:00:00 2 [drp] via OPHTHALMIC
  Administered 2017-09-02: 10:00:00 1 [drp] via OPHTHALMIC
  Filled 2017-08-26: qty 15

## 2017-08-26 MED ORDER — NAPHAZOLINE-PHENIRAMINE 0.025-0.3 % OP SOLN
1.0000 [drp] | Freq: Four times a day (QID) | OPHTHALMIC | Status: DC
  Filled 2017-08-26: qty 5

## 2017-08-26 MED ORDER — INSULIN ASPART 100 UNIT/ML ~~LOC~~ SOLN
3.0000 [IU] | Freq: Three times a day (TID) | SUBCUTANEOUS | Status: DC
  Administered 2017-08-27 – 2017-08-28 (×5): 3 [IU] via SUBCUTANEOUS
  Filled 2017-08-26 (×4): qty 1

## 2017-08-26 MED ORDER — POTASSIUM CHLORIDE CRYS ER 10 MEQ PO TBCR
30.0000 meq | EXTENDED_RELEASE_TABLET | Freq: Two times a day (BID) | ORAL | Status: AC
Start: 2017-08-26 — End: 2017-08-26
  Administered 2017-08-26 (×2): 30 meq via ORAL
  Filled 2017-08-26 (×2): qty 1

## 2017-08-26 MED ORDER — INSULIN REGULAR HUMAN 100 UNIT/ML IJ SOLN
3.0000 [IU] | Freq: Three times a day (TID) | INTRAMUSCULAR | Status: DC
  Administered 2017-08-26: 3 [IU] via SUBCUTANEOUS
  Filled 2017-08-26 (×3): qty 0.03

## 2017-08-26 NOTE — Progress Notes (Signed)
I have been asked to provide prognosis of his liver disease  Based on the Childs Pughs score- he scores 13 assuming he has mild confusion , moderate ascites. A score of 13 translates to one- and two-year patient survival:  45 and 35% respectively .  Dr [name] MD,MRCP [facility]) Gastroenterology/Hepatology Pager: [phone]

## 2017-08-26 NOTE — Progress Notes (Signed)
  Speech Language Pathology Treatment: Dysphagia  Patient Details Name: Jesus [name] MRN: [id number] DOB: 1951-08-04 Today's Date: 08/26/2017 Time: 1200-1215 SLP Time Calculation (min) (ACUTE ONLY): 15 min  Assessment / Plan / Recommendation Clinical Impression  Pt agreeable to SLP tx at bedside addressing diet tolerance analysis and readiness for diet upgrade of solids.  Pt observed with trials of thin liquid x4, mech soft x5, and hard solid x3.  Pt presented with x1 min throat clear post all trials, however no other overt s/sx of aspiration observed with several consistencies.  Pt (+) timely swallow, WFL to palpation with no pocketing of oral residue noted.  No decline in vocal quality or respiratory status observed during the session. Pt reportedly tolerating dysphagia 2 diet with thin liquids without coughing, throat clearing, or difficulty per pt and his daughter who assists with meals.   Recommend upgrade to regular diet with unrestricted liquids with adherence to aspiration precautions and intermittent supervision to assure safety and remind pt of precautions/swallow strategies.  Re-education re: safe swallow strategies completed with pt and his daughter at bedside.  They expressed verbal understanding to such.    ST services will f/u for diet tolerance analysis and pt and family education.   HPI HPI: [name] admitted to the ICU with PEA arrest x 2 , acute on chronic anemia with prior evaluation at the [place] with no source of blood loss seen. H/o liver cirrhosis secondary to NASH. On this admission noted to have a possible shocked liver, on pressors, vomiting blood, received blood. 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 Pt was emergently intubated at admission, extubated 08/18/17. Pt fully alert this date and reportedly tolerating current diet - per pt and daughter report.      SLP Plan  Continue with current plan of care        Recommendations  Diet recommendations: Regular Liquids provided via: Cup;Straw Medication Administration: Whole meds with puree Supervision: Patient able to self feed;Staff to assist with self feeding;Full supervision/cueing for compensatory strategies Compensations: Minimize environmental distractions;Slow rate;Small sips/bites;Follow solids with liquid Postural Changes and/or Swallow Maneuvers: Seated upright 90 degrees;Upright 30-60 min after meal                Oral Care Recommendations: Oral care BID Follow up Recommendations: Skilled Nursing facility SLP Visit Diagnosis: Dysphagia, oropharyngeal phase (R13.12) Plan: Continue with current plan of care       GO                Toshua Honsinger 08/26/2017, 12:19 PM

## 2017-08-26 NOTE — Progress Notes (Signed)
Inpatient Diabetes Program Recommendations  AACE/ADA: New Consensus Statement on Inpatient Glycemic Control (2015)  Target Ranges:  Prepandial:   less than 140 mg/dL      Peak postprandial:   less than 180 mg/dL (1-2 hours)      Critically ill patients:  140 - 180 mg/dL   Results for Jesus [name], Jesus [name] (MRN [id number]) as of 08/26/2017 09:11  Ref. Range 08/25/2017 07:43 08/25/2017 11:57 08/25/2017 17:04 08/25/2017 21:23  Glucose-Capillary Latest Ref Range: 65 - 99 mg/dL 409 (H) 811 (H) 914 (H) 237 (H)   Results for Jesus [name], Jesus [name] (MRN [id number]) as of 08/26/2017 09:11  Ref. Range 08/26/2017 07:28  Glucose-Capillary Latest Ref Range: 65 - 99 mg/dL 086 (H)    Home DM Meds: Lantus 34 units daily                             Novolog 7 units TID                             Glyburide 5 mg daily                             Metformin 1000 mg BID  Current Insulin Orders: Lantus 12 units QHS                                       Novolog Sensitive Correction Scale/ SSI (0-9 units) TID AC + HS       MD- Note fasting glucose well controlled on current dose of Lantus.  Patient having some issues with elevated CBGs in the afternoon.  Please consider starting low dose Novolog Meal Coverage:  Novolog 3 units TID with meals (hold if pt eats <50% of meal)      --Will follow patient during hospitalization--  Ambrose Finland RN, MSN, CDE Diabetes Coordinator Inpatient Glycemic Control Team Team Pager: [phone] (8a-5p)

## 2017-08-26 NOTE — Progress Notes (Signed)
MEDICATION RELATED CONSULT NOTE - follow  Pharmacy Consult for electrolyte management Indication: hypokalemia  Allergies  Allergen Reactions  . Penicillins Hives and Other (See Comments)    Has patient had a PCN reaction causing immediate rash, facial/tongue/throat swelling, SOB or lightheadedness with hypotension: Yes Has patient had a PCN reaction causing severe rash involving mucus membranes or skin necrosis: No Has patient had a PCN reaction that required hospitalization: No Has patient had a PCN reaction occurring within the last 10 years: No If all of the above answers are "NO", then may proceed with Cephalosporin use.     Patient Measurements: Height:  (165.1 cm) Weight: (!) 302 lb 0.5 oz (137 kg) IBW/kg (Calculated) : 61.5  Vital Signs: Temp: 97.7 F (36.5 C) (09/26 0416) Temp Source: Oral (09/26 0416) BP: 109/71 (09/26 0416) Pulse Rate: 75 (09/26 0416) Intake/Output from previous day: 09/25 0701 - 09/26 0700 In: 840 [P.O.:840] Out: 650 [Stool:650] Intake/Output from this shift: Total I/O In: 240 [P.O.:240] Out: -   Labs:  Recent Labs  08/24/17 0323 08/25/17 0518 08/25/17 1230 08/25/17 1950 08/26/17 0443  WBC 7.7  --   --   --   --   HGB 10.1*  --   --   --   --   HCT 30.9*  --   --   --   --   PLT 56*  --   --   --   --   CREATININE 1.18 1.06 1.14 0.98 1.02  MG  --  1.7  --   --  1.7  ALBUMIN  --  2.4*  --   --   --   PROT  --  5.7*  --   --   --   AST  --  73*  --   --   --   ALT  --  131*  --   --   --   ALKPHOS  --  97  --   --   --   BILITOT  --  11.5*  --   --   --    Estimated Creatinine Clearance: 92.4 mL/min (by C-G formula based on SCr of 1.02 mg/dL).   Microbiology: Recent Results (from the past 720 hour(s))  Blood Culture (routine x 2)     Status: None   Collection Time: 08/16/17 12:50 AM  Result Value Ref Range Status   Specimen Description BLOOD LEFT ARM  Final   Special Requests   Final    BOTTLES DRAWN AEROBIC AND  ANAEROBIC Blood Culture adequate volume   Culture NO GROWTH 5 DAYS  Final   Report Status 08/21/2017 FINAL  Final  Blood Culture (routine x 2)     Status: None   Collection Time: 08/16/17 12:51 AM  Result Value Ref Range Status   Specimen Description BLOOD LEFT ANTECUBITAL  Final   Special Requests   Final    BOTTLES DRAWN AEROBIC AND ANAEROBIC Blood Culture adequate volume   Culture NO GROWTH 5 DAYS  Final   Report Status 08/21/2017 FINAL  Final  Urine culture     Status: Abnormal   Collection Time: 08/16/17 12:51 AM  Result Value Ref Range Status   Specimen Description URINE, RANDOM  Final   Special Requests NONE  Final   Culture MULTIPLE SPECIES PRESENT, SUGGEST RECOLLECTION (A)  Final   Report Status 08/17/2017 FINAL  Final  MRSA PCR Screening     Status: None   Collection Time: 08/16/17  4:56  AM  Result Value Ref Range Status   MRSA by PCR NEGATIVE NEGATIVE Final    Comment:        The GeneXpert MRSA Assay (FDA approved for NASAL specimens only), is one component of a comprehensive MRSA colonization surveillance program. It is not intended to diagnose MRSA infection nor to guide or monitor treatment for MRSA infections.   Body fluid culture     Status: None (Preliminary result)   Collection Time: 08/25/17  9:04 AM  Result Value Ref Range Status   Specimen Description PERITONEAL  Final   Special Requests NONE  Final   Gram Stain   Final    RARE WBC PRESENT, PREDOMINANTLY MONONUCLEAR NO ORGANISMS SEEN Performed at [facility], [address], [place], [place] [address]    Culture PENDING  Incomplete   Report Status PENDING  Incomplete    Medical History: Past Medical History:  Diagnosis Date  . Cirrhosis of liver (HCC)   . Diabetes (HCC)     Medications:  Prescriptions Prior to Admission  Medication Sig Dispense Refill Last Dose  . aspirin 81 MG EC tablet Take 81 mg by mouth daily. Swallow whole.     . baclofen (LIORESAL) 10 MG tablet Take 10 mg by  mouth 2 (two) times daily as needed for muscle spasms.     Marland Kitchen escitalopram (LEXAPRO) 10 MG tablet Take 20 mg by mouth daily.     Marland Kitchen glipiZIDE (GLUCOTROL) 5 MG tablet Take by mouth daily before breakfast.     . HYDROcodone-acetaminophen (NORCO/VICODIN) 5-325 MG tablet Take 1 tablet by mouth 2 (two) times daily as needed for moderate pain.     . hydrOXYzine (ATARAX/VISTARIL) 25 MG tablet Take 25 mg by mouth at bedtime as needed.     Marland Kitchen oxybutynin (DITROPAN) 5 MG tablet Take 5 mg by mouth 2 (two) times daily as needed for bladder spasms.     Marland Kitchen spironolactone (ALDACTONE) 25 MG tablet Take 25 mg by mouth daily.     . tamsulosin (FLOMAX) 0.4 MG CAPS capsule Take 0.4 mg by mouth daily.     . traZODone (DESYREL) 100 MG tablet Take 150 mg by mouth at bedtime.     . furosemide (LASIX) 40 MG tablet Take 40 mg by mouth 2 (two) times daily.    Past Week at Unknown time  . metFORMIN (GLUCOPHAGE) 1000 MG tablet Take 1,000 mg by mouth 2 (two) times daily with a meal.   Past Week at unknown  . pantoprazole (PROTONIX) 40 MG tablet Take 1 tablet (40 mg total) by mouth daily. 30 tablet 0   . primidone (MYSOLINE) 50 MG tablet Take 100 mg by mouth at bedtime.     . [DISCONTINUED] glyBURIDE (DIABETA) 5 MG tablet Take 5 mg by mouth daily.   Past Week at Unknown time  . [DISCONTINUED] insulin aspart (NOVOLOG) 100 UNIT/ML injection Inject 7 Units into the skin 3 (three) times daily with meals. 10 mL 11   . [DISCONTINUED] insulin glargine (LANTUS) 100 UNIT/ML injection Inject 0.34 mLs (34 Units total) into the skin daily. 10 mL 11   . [DISCONTINUED] lactulose (CHRONULAC) 10 GM/15ML solution Take 45 mLs (30 g total) by mouth daily. 240 mL 0   . [DISCONTINUED] levofloxacin (LEVAQUIN) 750 MG tablet Take 1 tablet (750 mg total) by mouth daily. 3 tablet 0   . [DISCONTINUED] traZODone (DESYREL) 50 MG tablet Take 75 mg by mouth at bedtime.    Past Week at Unknown time  Scheduled:  . feeding supplement (ENSURE ENLIVE)  237 mL Oral  TID BM  . insulin aspart  0-5 Units Subcutaneous QHS  . insulin aspart  0-9 Units Subcutaneous TID WC  . insulin glargine  12 Units Subcutaneous QHS  . lactulose  20 g Oral BID  . nadolol  20 mg Oral Daily  . pantoprazole (PROTONIX) IV  40 mg Intravenous Q12H  . polyvinyl alcohol  1 drop Both Eyes TID  . potassium chloride  30 mEq Oral BID  . rifaximin  550 mg Oral BID  . sodium chloride flush  3 mL Intravenous Q12H  . spironolactone  25 mg Oral Daily   Infusions:   PRN: haloperidol lactate, hydrALAZINE, LORazepam, [DISCONTINUED] ondansetron **OR** ondansetron (ZOFRAN) IV Anti-infectives    Start     Dose/Rate Route Frequency Ordered Stop   08/21/17 2200  meropenem (MERREM) 1 g in sodium chloride 0.9 % 100 mL IVPB  Status:  Discontinued     1 g 200 mL/hr over 30 Minutes Intravenous Every 8 hours 08/21/17 1121 08/21/17 1427   08/21/17 2000  ciprofloxacin (CIPRO) tablet 500 mg     500 mg Oral 2 times daily 08/21/17 1427 08/23/17 0814   08/21/17 1500  rifaximin (XIFAXAN) tablet 550 mg     550 mg Oral 2 times daily 08/21/17 1347     08/21/17 1430  metroNIDAZOLE (FLAGYL) tablet 500 mg     500 mg Oral Every 8 hours 08/21/17 1427 08/23/17 0547   08/18/17 2200  levofloxacin (LEVAQUIN) IVPB 750 mg  Status:  Discontinued     750 mg 100 mL/hr over 90 Minutes Intravenous Every 48 hours 08/16/17 0243 08/16/17 0537   08/18/17 1000  meropenem (MERREM) 2 g in sodium chloride 0.9 % 100 mL IVPB  Status:  Discontinued     2 g 200 mL/hr over 30 Minutes Intravenous Every 12 hours 08/18/17 0837 08/21/17 1121   08/16/17 1500  aztreonam (AZACTAM) 1 g in dextrose 5 % 50 mL IVPB  Status:  Discontinued     1 g 100 mL/hr over 30 Minutes Intravenous Every 12 hours 08/16/17 0248 08/16/17 0537   08/16/17 1145  rifaximin (XIFAXAN) tablet 550 mg  Status:  Discontinued     550 mg Oral 2 times daily 08/16/17 1141 08/17/17 0844   08/16/17 0900  vancomycin (VANCOCIN) 1,500 mg in sodium chloride 0.9 % 500 mL IVPB   Status:  Discontinued     1,500 mg 250 mL/hr over 120 Minutes Intravenous Every 24 hours 08/16/17 0243 08/17/17 0844   08/16/17 0545  meropenem (MERREM) 1 g in sodium chloride 0.9 % 100 mL IVPB  Status:  Discontinued     1 g 200 mL/hr over 30 Minutes Intravenous Every 12 hours 08/16/17 0539 08/18/17 0837   08/16/17 0545  metroNIDAZOLE (FLAGYL) IVPB 500 mg  Status:  Discontinued     500 mg 100 mL/hr over 60 Minutes Intravenous Every 8 hours 08/16/17 0539 08/17/17 0844   08/16/17 0415  rifaximin (XIFAXAN) tablet 200 mg     200 mg Per Tube 2 times daily 08/16/17 0410 08/16/17 0506   08/16/17 0230  levofloxacin (LEVAQUIN) IVPB 750 mg     750 mg 100 mL/hr over 90 Minutes Intravenous  Once 08/16/17 0228 08/16/17 0405   08/16/17 0230  aztreonam (AZACTAM) 2 g in dextrose 5 % 50 mL IVPB     2 g 100 mL/hr over 30 Minutes Intravenous  Once 08/16/17 0228 08/16/17 0318   08/16/17 0230  vancomycin (VANCOCIN) IVPB 1000 mg/200 mL premix     1,000 mg 200 mL/hr over 60 Minutes Intravenous  Once 08/16/17 0228 08/16/17 0407      Assessment: Potassium with am labs was 2.9. Replacing with KCl via iv. Will recheck 1 hour after last infusion. Also checking Mg+ level this afternoon, will discuss with Dr Amado Coe during rounds. Patient has hypernatremia, which I paged Dr Amado Coe about, let her know there was no electrolyte protocol for pharmacists to address hypernatremia. Recommended she manage or consult nephrology potentially for their input.  Goal of Therapy:  Electrolytes WNL through gentle adjustment   Plan:  KCL iv q1h x 4 doses, recheck K+ and Mg+ this afternoon. Discuss hypernatremia with Dr Amado Coe this afternoon.   9/25@1420 . Spoke with RN Baltazar Najjar because only 2 of 4 bags KCl have been administered, she states the rate has had to be slowed by significantly due to pt complaining of burning. Will reschedule K+ to 1800. Pharmacist to follow and replace if necessary.   9/25 1950 K 4.8. No  further supplement at this time. Recheck electrolytes with AM labs.   9/26 @ 0500 K 3.4. Will give KCI 30 mEq PO x 2 doses for a estimated K of 4.0 mEq. Will recheck w/ am labs.  Thomasene Ripple, PharmD, BCPS Clinical Pharmacist 08/26/2017

## 2017-08-26 NOTE — Progress Notes (Signed)
[facility] [place] [facility]   PATIENT NAME: Jesus [name]    MR#:  [id number]  DATE OF BIRTH:  08-29-1951  SUBJECTIVE:  CHIEF COMPLAINT:   Chief Complaint  Patient presents with  . Altered Mental Status  . Hyperglycemia   -patient had paracentesis yesterday, 4 L of fluid was taken out. Reassessed by speech therapy  Mental status is much improved.   REVIEW OF SYSTEMS:  Review of Systems  Constitutional: Positive for malaise/fatigue. Negative for chills and fever.  HENT: Negative for congestion, ear discharge, hearing loss and nosebleeds.   Respiratory: Negative for cough, shortness of breath and wheezing.   Cardiovascular: Positive for leg swelling. Negative for chest pain and palpitations.  Gastrointestinal: Negative for abdominal pain, constipation, diarrhea, nausea and vomiting.  Genitourinary: Negative for dysuria.  Musculoskeletal: Negative for myalgias.  Neurological: Negative for dizziness, speech change, focal weakness, seizures and headaches.  Psychiatric/Behavioral: Negative for depression.    DRUG ALLERGIES:   Allergies  Allergen Reactions  . Penicillins Hives and Other (See Comments)    Has patient had a PCN reaction causing immediate rash, facial/tongue/throat swelling, SOB or lightheadedness with hypotension: Yes Has patient had a PCN reaction causing severe rash involving mucus membranes or skin necrosis: No Has patient had a PCN reaction that required hospitalization: No Has patient had a PCN reaction occurring within the last 10 years: No If all of the above answers are "NO", then may proceed with Cephalosporin use.     VITALS:  Blood pressure (!) 118/95, pulse 86, temperature 98 F (36.7 C), temperature source Oral, resp. rate 20, height  (1.651 m), weight (!) 137 kg (302 lb 0.5 oz), SpO2 99 %.  PHYSICAL EXAMINATION:  Physical Exam  GENERAL:  66 y.o.-year-old obese patient lying in the bed with no acute distress.    EYES: Pupils equal, round, reactive to light and accommodation. No scleral icterus. Extraocular muscles intact.  HEENT: Head atraumatic, normocephalic. Oropharynx and nasopharynx clear.  NECK:  Supple, no jugular venous distention. No thyroid enlargement, no tenderness.  LUNGS: Normal breath sounds bilaterally, no wheezing, rales,rhonchi or crepitation. No use of accessory muscles of respiration.  CARDIOVASCULAR: S1, S2 normal. No murmurs, rubs, or gallops.  ABDOMEN: Soft, nontender, very distended. Bowel sounds present. Rectal tube is present EXTREMITIES: 3+ pedal edema, all the way up to the abdominal wall noted. Scrotal edema No  cyanosis, or clubbing.  NEUROLOGIC: Cranial nerves II through XII are intact. Muscle strength 5/5 in all extremities. Sensation intact. Gait not checked. Global weakness noted. PSYCHIATRIC: The patient is alert and oriented x 3.  SKIN: No obvious rash, lesion, or ulcer.    LABORATORY PANEL:   CBC  Recent Labs Lab 08/24/17 0323  WBC 7.7  HGB 10.1*  HCT 30.9*  PLT 56*   ------------------------------------------------------------------------------------------------------------------  Chemistries   Recent Labs Lab 08/25/17 0518  08/26/17 0443  NA 153*  < > 149*  K 2.9*  < > 3.4*  CL 120*  < > 118*  CO2 25  < > 25  GLUCOSE 195*  < > 150*  BUN 31*  < > 22*  CREATININE 1.06  < > 1.02  CALCIUM 8.5*  < > 8.4*  MG 1.7  --  1.7  AST 73*  --   --   ALT 131*  --   --   ALKPHOS 97  --   --   BILITOT 11.5*  --   --   < > =  values in this interval not displayed. ------------------------------------------------------------------------------------------------------------------  Cardiac Enzymes No results for input(s): TROPONINI in the last 168 hours. ------------------------------------------------------------------------------------------------------------------  RADIOLOGY:  US Paracentesis  Result Date: 08/25/2017 INDICATION: Patient with  history of cirrhosis, ascites. Request is made for diagnostic and therapeutic paracentesis EXAM: ULTRASOUND GUIDED DIAGNOSTIC AND THERAPEUTIC PARACENTESIS MEDICATIONS: 10 mL 1% lidocaine COMPLICATIONS: None immediate. PROCEDURE: Informed written consent was obtained from the patient after a discussion of the risks, benefits and alternatives to treatment. A timeout was performed prior to the initiation of the procedure. Initial ultrasound scanning demonstrates a moderate amount of ascites within the right lateral abdomen. The right lateral abdomen was prepped and draped in the usual sterile fashion. 1% lidocaine was used for local anesthesia. Following this, a 6 Fr Safe-T-Centesis catheter was introduced. An ultrasound image was saved for documentation purposes. The paracentesis was performed. The catheter was removed and a dressing was applied. The patient tolerated the procedure well without immediate post procedural complication. FINDINGS: A total of approximately 4.0 liters of yellow fluid was removed. Samples were sent to the laboratory as requested by the clinical team. IMPRESSION: Successful ultrasound-guided diagnostic and therapeutic paracentesis yielding 4.0 liters of peritoneal fluid. Read by:  Loyce Dys PA-C Electronically Signed   By: Alcide Clever M.D.   On: 08/25/2017 10:02    EKG:   Orders placed or performed during the hospital encounter of 08/16/17  . EKG 12-Lead  . EKG 12-Lead  . ED EKG 12-Lead  . ED EKG 12-Lead  . EKG 12-Lead  . EKG 12-Lead    ASSESSMENT AND PLAN:   66 year old male with past medical his presents to hospital secondary to altered mental status. Also had a cardiac arrest and was in ICU. Currently extubated and transferred to the floor  # acute liver failure with history of liver cirrhosis-transaminases are improving, continue to monitor INR per GI recommendations. Bilirubin is still elevated. 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 Hb stable - off octreotide drip. On Protonix twice a day -stable hemoglobin. -outpatient EGD in 2-3 weeks   # anasarca- secondary to liver failure. Albumin is low at 2.2.  - received IV albumin for 3 days - on Lasix and Aldactone. -ultrasound abdomen with liver cirrhosis, ? Portal vein thrombosis, spleenomegaly and ascites - US guided paracentesis done today and 4 L of fluid removed - on empiric cipro and flagyl- stop after 10 days  # severe hypernatremia and hypokalemia Improved on D5 half-normal saline with 20 K Diet changed to dysphagia type II with thin liquids Oral potassium supplements Repeat electrolytes  # acute encephalopathy-anoxic from cardiac arrest, and also hepatic -improving mental status. Ammonia is slightly elevated. Rpt ammonia level nml - on lactulose.  # DM- lantus, ssi Appreciate diabetes coordinator input  # DVT Prophylaxis- TEDS and SCDS   PT recommended SNF- patient prefers going home with home health, wife is agreeable Discussed with daughters- she wants to talk to gastroenterology Palliative care is following   All the records are reviewed and case discussed with Care Management/Social Workerr. Management plans discussed with the patient, family and they are in agreement.  CODE STATUS: DNR  TOTAL TIME TAKING CARE OF THIS PATIENT: 35  minutes.   POSSIBLE D/C  IN 2-3 DAYS, DEPENDING ON CLINICAL CONDITION.   Ramonita Lab M.D on 08/26/2017 at 2:11 PM  Between 7am to 6pm - Pager - [phone]  After 6pm go to [email or web address] - password [email or web address]  [facility]  Office  [phone]  CC: Primary care physician; System, Pcp Not In

## 2017-08-26 NOTE — Clinical Social Work Note (Addendum)
CSW spoke to patient's daughter, and they have decided they would like patient to go to SNF for short term rehab with palliative to follow.  CSW presented bed offers to patient's daughter and they have chosen [facility] of [address]  CSW contacted [facility] [place], and they can accept patient once patient is medically ready for discharge and insurance has been approved.  CSW to continue to follow patient's progress throughout discharge planning.  CSW faxed required clinicals to patient's insurance company.  Ervin Knack. Hassan Rowan, MSW, [facility] [phone]  08/26/2017 4:32 PM

## 2017-08-26 NOTE — Clinical Social Work Note (Addendum)
CSW received phone call from patient's daughter Efraim Kaufmann [phone].  Patient's daughter stated that they are trying to decide if they want patient to go home with hospice or have home health and palliative to follow.  Patient's daughter stated that they would like to use [facility] for home health, CSW informed her that PT is recommending SNF, but patient's daughter still would like to have patient go home with home health.  CSW reminded her that home health will only come out once or twice a week, patient's daughter expressed that she understands.  Patient's daughter also said they were trying to decide if patient will be more appropriate for hospice at home, patient's daughter stated that she has spoken with [facility] and is interested in receiving services if they decide.  Daughter stated she wanted to speak with physician and palliative care team before they make final decision on what they want to do.  CSW informed case manager, palliative team and attending physician, CSW to continue to follow patient's progress.  Ervin Knack. Quenna Doepke, MSW, [facility] [phone]  08/26/2017 1:11 PM

## 2017-08-26 NOTE — Progress Notes (Signed)
Daily Progress Note   Patient Name: Jesus [name]       Date: 08/26/2017 DOB: 06/29/1951  Age: 66 y.o. MRN#: [id number] Attending Physician: Ramonita Lab, MD Primary Care Physician: System, Pcp Not In Admit Date: 08/16/2017  Reason for Consultation/Follow-up: Establishing goals of care  Subjective/GOC: Upon arrival to room, patient awake, alert, and oriented with periods of confusion. Answers questions and follows commands. He tells me he bottom is sore from rectal tube. Worked with physical therapy today and sat in chair.   Daughters (Melissa and Arna Medici) at bedside. They request we speak outside of room. Wife and Arna Medici are documented HCPOA but wife "leaves discussions up to [place]." Discussed at length diagnoses, interventions, underlying co-morbidities, prognosis, and disposition options. Daughters requesting to speak with GI and I was able to get Dr. Tobi Bastos to update them.   Daughters are most concerned about disposition options. They tell me he has been crying frequently today and feel rehab will make his mood worse. They want him to return home but speak of their mother's health problems and do not feel she can provide the care he needs. They asked about hospice as an option. Educated on hospice focus and options with goal of comfort and quality and preventing re-hospitalization. We did discuss concern of recurrent hospitalization secondary to progressive liver disease/failure.   At the end of the conversation, daughters decided to attempt SNF for rehab and agreeable with palliative services to follow. They understand this can transition to hospice services at any time. They want to f/u with GI in 2-3 weeks. They are realistic in guarded long-term prognosis due to liver cirrhosis and complications that  can continue to occur from this.   Daughters are worried about his mood. He takes trazodone HS at home. We discussed started a low dose of trazodone here at the hospital. They also tell me he has been complaining of burning, itchy, red eyes not relieved by current eye drops.   Therapeutic listening and emotional support provided. Questions and concerns addressed.   Length of Stay: 10  Current Medications: Scheduled Meds:  . feeding supplement (ENSURE ENLIVE)  237 mL Oral TID BM  . insulin aspart  0-5 Units Subcutaneous QHS  . insulin aspart  0-9 Units Subcutaneous TID WC  . insulin glargine  12 Units Subcutaneous QHS  .  insulin regular  3 Units Subcutaneous TID AC  . lactulose  20 g Oral BID  . nadolol  20 mg Oral Daily  . naphazoline-pheniramine  1 drop Both Eyes QID  . pantoprazole (PROTONIX) IV  40 mg Intravenous Q12H  . potassium chloride  30 mEq Oral BID  . rifaximin  550 mg Oral BID  . sodium chloride flush  3 mL Intravenous Q12H  . spironolactone  25 mg Oral Daily  . traZODone  50 mg Oral QHS    Continuous Infusions:  PRN Meds: haloperidol lactate, hydrALAZINE, LORazepam, [DISCONTINUED] ondansetron **OR** ondansetron (ZOFRAN) IV  Physical Exam  Constitutional: He is cooperative.  HENT:  Head: Normocephalic and atraumatic.  Cardiovascular: Regular rhythm.   Pulmonary/Chest: Effort normal.  Abdominal: There is no tenderness.  Neurological: He is alert.  Pleasantly confused  Skin: Skin is warm and dry.  jaundice  Psychiatric: He has a normal mood and affect. His behavior is normal. His speech is delayed.  Nursing note and vitals reviewed.          Vital Signs: BP (!) 118/95   Pulse 86   Temp 98 F (36.7 C) (Oral)   Resp 20   Ht  (1.651 m)   Wt (!) 137 kg (302 lb 0.5 oz)   SpO2 99%   BMI 50.26 kg/m  SpO2: SpO2: 99 % O2 Device: O2 Device: Not Delivered O2 Flow Rate: O2 Flow Rate (L/min): 0 L/min  Intake/output summary:   Intake/Output Summary (Last  24 hours) at 08/26/17 1718 Last data filed at 08/26/17 1332  Gross per 24 hour  Intake              720 ml  Output             1000 ml  Net             -280 ml   LBM: Last BM Date: 08/26/17 Baseline Weight: Weight: 123.4 kg (272 lb) Most recent weight: Weight: (!) 137 kg (302 lb 0.5 oz)   Palliative Assessment/Data: PPS 40%   Flowsheet Rows     Most Recent Value  Intake Tab  Clinical Assessment  Palliative Performance Scale Score  40%  Psychosocial & Spiritual Assessment  Palliative Care Outcomes  Patient/Family meeting held?  Yes  Who was at the meeting?  daughters Efraim Kaufmann and Arna Medici)  Palliative Care Outcomes  Clarified goals of care, Provided end of life care assistance, Provided psychosocial or spiritual support, Linked to palliative care logitudinal support, Counseled regarding hospice, Improved pain interventions, Improved non-pain symptom therapy, ACP counseling assistance      Patient Active Problem List   Diagnosis Date Noted  . Acute blood loss anemia   . Coagulopathy (HCC)   . Acute encephalopathy   . DNR (do not resuscitate)   . Hemorrhagic shock (HCC)   . AKI (acute kidney injury) (HCC)   . Hepatic cirrhosis (HCC)   . Liver cirrhosis secondary to NASH (HCC)   . Hematemesis   . Palliative care encounter   . DNR (do not resuscitate) discussion   . Cardiac arrest (HCC) 08/16/2017  . 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 Sepsis (HCC) 05/28/2017    Palliative Care Assessment & Plan   Patient Profile: 66 y.o. male  with past medical history of NASH cirrhosis, diabetes mellitus, and recurrent anemia (receiving regular transfusions) who was admitted on 08/16/2017 with altered mental status and hyperglycemia.  At home he was found unresponsive. He had been  confused and vomiting blood. In the ED he suffered cardiac arrest x2 and was resuscitated. Admission work up showed severe anemia. His lactic acid was elevated (21), Ph 7.1 and he required  emergent intubation. Extubated 9/18 and transferred out of ICU. Patient had EGD with banding of varices and cauterization of gastric AV malformations. Abdominal ultrasound with liver cirrhosis, ? Portal vein thrombosis, spleenomegaly, and ascites. Paracentesis 9/25 with 4L removed. Transaminases and mental status improvement.   Assessment: Acute liver failure with hx of liver cirrhosis Hemorrhagic shock GI bleeding Ascites s/p paracentesis Hepatic encephalopathy Hypernatremia Hypokalemia  Recommendations/Plan:  Long discussion with daughters. They are now requesting SNF for rehab with palliative services to follow.  DNR  Outpatient GI f/u.  Discussed with Dr. Tobi Bastos. Ok with discontinuing rectal tube due to irritation/discomfort.   Symptom management  Trazodone  PO HS  Naphazoline-glycern 1-2 drops both eyes QID   May benefit from low dose anti-depressant.   PMT will continue to support patient/family through hospitalization.    Code Status: DNR   Code Status Orders        Start     Ordered   08/19/17 1335  Do not attempt resuscitation (DNR)  Continuous    Question Answer Comment  In the event of cardiac or respiratory ARREST Do not call a "code blue"   In the event of cardiac or respiratory ARREST Do not perform Intubation, CPR, defibrillation or ACLS   In the event of cardiac or respiratory ARREST Use medication by any route, position, wound care, and other measures to relive pain and suffering. May use oxygen, suction and manual treatment of airway obstruction as needed for comfort.      08/19/17 1334    Code Status History    Date Active Date Inactive Code Status Order ID Comments User Context   08/16/2017  4:04 AM 08/19/2017  1:34 PM Full Code [id number]  Ihor Austin, MD Inpatient   05/28/2017  5:15 AM 06/04/2017  8:10 PM Full Code [id number]  Arnaldo Natal, MD Inpatient       Prognosis:   Unable to determine  Discharge Planning:  Skilled Nursing  Facility for rehab with Palliative care service follow-up  Care plan was discussed with patient, daughters Arna Medici and [place]), RN, SW, and Dr. Tobi Bastos  Thank you for allowing the Palliative Medicine Team to assist in the care of this patient.   Time In: 1410 Time Out: 1545 Total Time Prolonged Time Billed  yes      Greater than 50%  of this time was spent counseling and coordinating care related to the above assessment and plan.  Vennie Homans, FNP-C Palliative Medicine Team  Phone: [phone] Fax: [phone]  Please contact Palliative Medicine Team phone at [phone] for questions and concerns.

## 2017-08-27 LAB — GLUCOSE, CAPILLARY
GLUCOSE-CAPILLARY: 214 mg/dL — AB (ref 65–99)
GLUCOSE-CAPILLARY: 227 mg/dL — AB (ref 65–99)
GLUCOSE-CAPILLARY: 223 mg/dL — AB (ref 65–99)
GLUCOSE-CAPILLARY: 275 mg/dL — AB (ref 65–99)
GLUCOSE-CAPILLARY: 239 mg/dL — AB (ref 65–99)
Glucose-Capillary: 250 mg/dL — ABNORMAL HIGH (ref 65–99)

## 2017-08-27 LAB — CBC
HCT: 31 % — ABNORMAL LOW (ref 40.0–52.0)
HEMOGLOBIN: 10.1 g/dL — AB (ref 13.0–18.0)
MCH: 29.3 pg (ref 26.0–34.0)
MCHC: 32.7 g/dL (ref 32.0–36.0)
MCV: 89.4 fL (ref 80.0–100.0)
PLATELETS: 59 10*3/uL — AB (ref 150–440)
RBC: 3.47 MIL/uL — AB (ref 4.40–5.90)
RDW: 22.6 % — ABNORMAL HIGH (ref 11.5–14.5)
WBC: 7 10*3/uL (ref 3.8–10.6)

## 2017-08-27 LAB — BASIC METABOLIC PANEL
ANION GAP: 6 (ref 5–15)
BUN: 23 mg/dL — ABNORMAL HIGH (ref 6–20)
CALCIUM: 8.2 mg/dL — AB (ref 8.9–10.3)
CO2: 23 mmol/L (ref 22–32)
Chloride: 112 mmol/L — ABNORMAL HIGH (ref 101–111)
Creatinine, Ser: 0.96 mg/dL (ref 0.61–1.24)
GFR calc Af Amer: >60 mL/min (ref >60)
Glucose, Bld: 265 mg/dL — ABNORMAL HIGH (ref 65–99)
Potassium: 4.1 mmol/L (ref 3.5–5.1)
SODIUM: 141 mmol/L (ref 135–145)

## 2017-08-27 LAB — HEMOGLOBIN A1C
Hgb A1c MFr Bld: 5.9 % — ABNORMAL HIGH (ref 4.8–5.6)
Mean Plasma Glucose: 122.63 mg/dL

## 2017-08-27 MED ORDER — FUROSEMIDE 10 MG/ML IJ SOLN
40.0000 mg | Freq: Two times a day (BID) | INTRAMUSCULAR | Status: DC
  Administered 2017-08-27: 14:00:00 40 mg via INTRAVENOUS
  Filled 2017-08-27: qty 4

## 2017-08-27 MED ORDER — ALBUMIN HUMAN 25 % IV SOLN
12.5000 g | Freq: Every day | INTRAVENOUS | Status: AC
  Administered 2017-08-27 – 2017-08-29 (×3): 12.5 g via INTRAVENOUS
  Filled 2017-08-27 (×3): qty 50

## 2017-08-27 MED ORDER — DEXTROSE 5 % IV SOLN
4.0000 mg/h | INTRAVENOUS | Status: AC
  Administered 2017-08-27: 17:00:00 4 mg/h via INTRAVENOUS
  Filled 2017-08-27: qty 25

## 2017-08-27 MED ORDER — TRAZODONE HCL 50 MG PO TABS
100.0000 mg | ORAL_TABLET | Freq: Every day | ORAL | Status: DC
Start: 2017-08-27 — End: 2017-09-02
  Administered 2017-08-27 – 2017-09-01 (×6): 100 mg via ORAL
  Filled 2017-08-27 (×6): qty 2

## 2017-08-27 NOTE — Progress Notes (Signed)
  Speech Therapy Note: Reviewed chart notes; labs. Consulted NSG then pt's family.  Pt's wife and daughter were present. Pt's daughter stated that she had been in room for yesterday's dinner and today's breakfast. Pt's daughter denied any difficulty for pt swallowing, and pt is currently on a regular diet; tolerates swallowing pills whole w/ puree per NSG. Daughter and wife agreed that pt was back to his baseline for swallowing.  ST student reinforced aspiration precautions w/ family; education.  No further skilled ST services indicated as pt appears at his baseline. NSG to reconsult if any change in status while admitted.   [name], SLP-Graduate Student [name] 08/27/17, 1:46 PM   This information has been reviewed and agreed upon by this supervising clinician.  This patient note, response to treatment and overall treatment plan has been reviewed and this clinician agrees with the information provided.  08/27/17, 2:09 PM [phone] [name], MS, CCC-SLP

## 2017-08-27 NOTE — Progress Notes (Addendum)
Inpatient Diabetes Program Recommendations  AACE/ADA: New Consensus Statement on Inpatient Glycemic Control (2015)  Target Ranges:  Prepandial:   less than 140 mg/dL      Peak postprandial:   less than 180 mg/dL (1-2 hours)      Critically ill patients:  140 - 180 mg/dL   Lab Results  Component Value Date   GLUCAP 227 (H) 08/27/2017   HGBA1C 9.9 (H) 05/28/2017    Review of Glycemic Control  Results for Jesus [name], Jesus [name] (MRN [id number]) as of 08/27/2017 08:42  Ref. Range 08/26/2017 11:46 08/26/2017 16:50 08/26/2017 21:37 08/27/2017 05:37 08/27/2017 07:36  Glucose-Capillary Latest Ref Range: 65 - 99 mg/dL 829 (H) 562 (H) 130 (H) 214 (H) 227 (H)    Home DM Meds: Lantus 34 units daily Novolog 7 units TID Glyburide 5 mg daily Metformin 1000 mg BID  Current Insulin Orders: Lantus 12 units QHS Novolog Sensitive Correction Scale/ SSI (0-9 units) TID AC , Novolog 0-5 units qhs       Novolog 3 units tid with meals   Novolog 3 units TID with meals (hold if pt eats <50% of meal) beginning today- anticipate improvement in numbers  Is it appropriate to put this patient on a carb modified diet?   Susette Racer, RN, BA, MHA, CDE Diabetes Coordinator Inpatient Diabetes Program  [phone] (Team Pager) [phone] [facility] Office) 08/27/2017 8:47 AM

## 2017-08-27 NOTE — Care Management Important Message (Signed)
Important Message  Patient Details  Name: Jesus [name] MRN: [id number] Date of Birth: 1951-09-09   Medicare Important Message Given:  Yes  Signed IM notice given   Eber Hong, RN 08/27/2017, 8:07 AM

## 2017-08-27 NOTE — Progress Notes (Signed)
Physical Therapy Treatment Patient Details Name: Jesus [name] MRN: [id number] DOB: December 18, 1950 Today's Date: 08/27/2017    History of Present Illness Pt admitted for cardiac arrest with complaints of AMS and hematemesis. PMH of cirrhosis, chronic iron deficiency, DM, hyperglycemia, cardiac arrest. Pt had two episodes of cardiac arrest in ED, went through 2 rounds of CPR, intubated from 9/16-9/18. Had severe GI bleed post EGD on 9/17, required multiple transfusions.    PT Comments    Pt is making good progress towards goals. Pt performed supine there-ex, bed mobility with mod assist, transfers and amb with RW with min assist, +2 for safety. Pt amb a total of 40 ft, from the bed to across the room two times, with guarding on either side. Pt amb slowly with small steps. Pt did not require a rest break, but appeared to fatigue with further amb. No dizziness or SOB. Pt reports discomfort with mobility activities due to swollen scrotum, however appears motivated to participate in PT activities.    Follow Up Recommendations  SNF     Equipment Recommendations  Rolling walker with 5" wheels    Recommendations for Other Services       Precautions / Restrictions Precautions Precautions: Fall Restrictions Weight Bearing Restrictions: No    Mobility  Bed Mobility Overal bed mobility: Needs Assistance Bed Mobility: Supine to Sit     Supine to sit: Mod assist     General bed mobility comments: Pt able to rise from supine to sit, required assistance with pulling trunk off from bed and utilized bed rails. Pt demonstrated decreased trunk control and required help keeping upright posture. Pt required assistance with bringing LEs off the bed and getting positioned. Pt reports swollen scrotum makes it difficult to sit up comfortably.   Transfers Overall transfer level: Needs assistance Equipment used: Rolling walker (2 wheeled) Transfers: Sit to/from Stand Sit to Stand: +2 safety/equipment;Mod  assist         General transfer comment: Pt able to rise from seated EOB to standing with RW, pt required cues for proper technique, and mod assist under arms to stand upright. No noted dizziness or LOB. +2 assist for safety  Ambulation/Gait Ambulation/Gait assistance: Min assist;+2 safety/equipment Ambulation Distance (Feet): 40 Feet Assistive device: Rolling walker (2 wheeled) Gait Pattern/deviations: Step-through pattern     General Gait Details: Pt able to amb with RW with min assist, +2 for safety. Pt amb with alternating step-through gait pattern with short step, amb very slowly but able to keep control of RW. Did not amb further due to pt being fatigued with increased distance. No reported dizziness, SOB or LOB.    Stairs            Wheelchair Mobility    Modified Rankin (Stroke Patients Only)       Balance Overall balance assessment: Needs assistance Sitting-balance support: Feet supported Sitting balance-Leahy Scale: Good Sitting balance - Comments: Pt able to sit at EOB and able to maintain position for several minutes. However requires CGA as pt has decreased trunk control and has to rest hands on bed or legs.     Standing balance support: Bilateral upper extremity supported Standing balance-Leahy Scale: Good Standing balance comment: Pt able to stand with RW with min assist, however +2 for safety. No dizziness or LOB.                             Cognition Arousal/Alertness: Awake/alert Behavior  During Therapy: WFL for tasks assessed/performed Overall Cognitive Status: Within Functional Limits for tasks assessed                                        Exercises Other Exercises Other Exercises: Supine ther-ex performed both sides 15x, ankle pumps, SLRs, hip abd/add, shoulder raises, bicep curls. Pt requires cues for proper technique, min assist to help initiate first few reps of exercise.     General Comments         Pertinent Vitals/Pain Pain Assessment: 0-10 Pain Score: 5  Pain Intervention(s): Limited activity within patient's tolerance;Monitored during session;Repositioned    Home Living                      Prior Function            PT Goals (current goals can now be found in the care plan section) Acute Rehab PT Goals Patient Stated Goal: "to be able to take care of self and live my life." PT Goal Formulation: With patient Progress towards PT goals: Progressing toward goals    Frequency    Min 2X/week      PT Plan Current plan remains appropriate    Co-evaluation              AM-PAC PT "6 Clicks" Daily Activity  Outcome Measure  Difficulty turning over in bed (including adjusting bedclothes, sheets and blankets)?: Unable Difficulty moving from lying on back to sitting on the side of the bed? : Unable Difficulty sitting down on and standing up from a chair with arms (e.g., wheelchair, bedside commode, etc,.)?: Unable Help needed moving to and from a bed to chair (including a wheelchair)?: A Lot Help needed walking in hospital room?: A Little Help needed climbing 3-5 steps with a railing? : A Little 6 Click Score: 11    End of Session Equipment Utilized During Treatment: Gait belt Activity Tolerance: Patient tolerated treatment well Patient left: in chair;with call bell/phone within reach;with chair alarm set;with family/visitor present Nurse Communication: Mobility status PT Visit Diagnosis: Other abnormalities of gait and mobility (R26.89);Muscle weakness (generalized) (M62.81);Pain     Time: 1610-9604 PT Time Calculation (min) (ACUTE ONLY): 25 min  Charges:                       G Codes:  Functional Assessment Tool Used: AM-PAC 6 Clicks Basic Mobility Functional Limitation: Mobility: Walking and moving around Mobility: Walking and Moving Around Current Status (V4098): At least 60 percent but less than 80 percent impaired, limited or  restricted Mobility: Walking and Moving Around Goal Status [phone]): At least 40 percent but less than 60 percent impaired, limited or restricted    Renford Dills, SPT   Renford Dills 08/27/2017, 11:11 AM

## 2017-08-27 NOTE — Progress Notes (Signed)
New referral for Palliative to follow at [facility] received from Palliative Medicine NP Vennie Homans. CSW Minerva Areola aware. No discharge date at this time. Patient information faxed to referral. Thank you. Dayna Barker RN, BSN, [facility] [place], [facility] [phone]

## 2017-08-27 NOTE — Clinical Social Work Note (Signed)
CSW received phone call from insurance company that patient has been approved to go to [facility] for short term rehab.  Patient's reference number is [id number], effective on Friday 08-28-17.  CSW contacted [facility] and they can accept patient once he is medically ready for discharge.  CSW gave [facility] referral that patient will need palliative to follow at SNF.  CSW updated patient's daughter Efraim Kaufmann at [phone] that patient has been approved for SNF.  CSW to continue to follow patient's progress throughout discharge planning.  Ervin Knack. Natina Wiginton, MSW, [facility] [phone]  08/27/2017 5:35 PM

## 2017-08-27 NOTE — Progress Notes (Signed)
Spoken with family and palliative care yesterday regarding prognosis and outlook.did diascuss not a candidate for anticoagulation for possible portal vein thrombosis as he is at very high risk for bleeding till varices are eradicated. They agree not for any anticouagultion.   Suggest outpatient GI follow up for repeat EGD Nadolol at discharge to prevent bleeding from varices Low sodium diet   I will sign off.  Please call me if any further GI concerns or questions.  We would like to thank you for the opportunity to participate in the care of Jesus [name]. Dr [name] MD,MRCP [facility]) Gastroenterology/Hepatology Pager: [phone]

## 2017-08-27 NOTE — Progress Notes (Signed)
Occupational Therapy Treatment Patient Details Name: Jesus [name] MRN: [id number] DOB: 1951/07/16 Today's Date: 08/27/2017    History of present illness Pt admitted for cardiac arrest with complaints of AMS and hematemesis. PMH of cirrhosis, chronic iron deficiency, DM, hyperglycemia, cardiac arrest. Pt had two episodes of cardiac arrest in ED, went through 2 rounds of CPR, intubated from 9/16-9/18. Had severe GI bleed post EGD on 9/17, required multiple transfusions.   OT comments  Pt seen for OT treatment session today focused on education/training in therapeutic exercises pt can perform to improve BUE strength and activity tolerance. Pt verbalized understanding and able to demonstrate technique after initial instruction and demonstration. Pt motivated to participate and eager to support his return to PLOF. STR continues to be most appropriate discharge plan. Will continue to progress.   Follow Up Recommendations  SNF    Equipment Recommendations       Recommendations for Other Services      Precautions / Restrictions Precautions Precautions: Fall Restrictions Weight Bearing Restrictions: No       Mobility Bed Mobility               General bed mobility comments: pt recently back to bed and elevating BLE with scrotal support to help with swelling, mobility deferred  Transfers                 General transfer comment: pt recently back to bed and elevating BLE with scrotal support to help with swelling, mobility deferred    Balance                                           ADL either performed or assessed with clinical judgement   ADL Overall ADL's : Needs assistance/impaired                                             Vision       Perception     Praxis      Cognition Arousal/Alertness: Awake/alert Behavior During Therapy: WFL for tasks assessed/performed Overall Cognitive Status: Within Functional Limits for  tasks assessed                                 General Comments: pt very tired during session        Exercises Other Exercises Other Exercises: Pt educated in BUE ther ex to perform in bed or in chair throughout the day to support improved strength and activity tolerance. Pt verbalized understanding of verbal instruction and visual demonstration provided.  Other Exercises: Pt educated in falls prevention strategies and home/routines modification to maximize safety. Pt verbalized understanding.   Shoulder Instructions       General Comments      Pertinent Vitals/ Pain       Pain Assessment: No/denies pain  Home Living Family/patient expects to be discharged to:: Private residence                                        Prior Functioning/Environment              Frequency  Min 1X/week        Progress Toward Goals  OT Goals(current goals can now be found in the care plan section)  Progress towards OT goals: Progressing toward goals     Plan Discharge plan remains appropriate;Frequency remains appropriate    Co-evaluation                 AM-PAC PT "6 Clicks" Daily Activity     Outcome Measure   Help from another person eating meals?: None Help from another person taking care of personal grooming?: None Help from another person toileting, which includes using toliet, bedpan, or urinal?: A Lot Help from another person bathing (including washing, rinsing, drying)?: A Lot Help from another person to put on and taking off regular upper body clothing?: A Little Help from another person to put on and taking off regular lower body clothing?: A Lot 6 Click Score: 17    End of Session    OT Visit Diagnosis: Muscle weakness (generalized) (M62.81)   Activity Tolerance Patient tolerated treatment well;Patient limited by fatigue   Patient Left in bed;with call bell/phone within reach;with bed alarm set   Nurse Communication           Time: 1610-9604 OT Time Calculation (min): 13 min  Charges: OT General Charges $OT Visit: 1 Visit OT Treatments $Therapeutic Exercise: 8-22 mins  Richrd Prime, MPH, MS, OTR/L ascom [phone] 08/27/17, 2:56 PM

## 2017-08-27 NOTE — [place] FL2 (Addendum)
[place] MEDICAID FL2 LEVEL OF CARE SCREENING TOOL     IDENTIFICATION  Patient Name: Jesus [name] Birthdate: 1951/11/04 Sex: male Admission Date (Current Location): 08/16/2017  [place] and [place] Number:  Chiropodist and Address:  [facility], [address] [place], [place] [address]      Provider Number: [id number]  Attending Physician Name and Address:  Ramonita Lab, MD  Relative Name and Phone Number:  Lemar Lofty (daughter) [phone]    Current Level of Care: Hospital Recommended Level of Care: Skilled Nursing Facility Prior Approval Number:    Date Approved/Denied:   PASRR Number: [id number]  Discharge Plan: SNF    Current Diagnoses: Patient Active Problem List   Diagnosis Date Noted  . Palliative care by specialist   . Acute blood loss anemia   . Coagulopathy (HCC)   . Acute encephalopathy   . DNR (do not resuscitate)   . Hemorrhagic shock (HCC)   . AKI (acute kidney injury) (HCC)   . Hepatic cirrhosis (HCC)   . Liver cirrhosis secondary to NASH (HCC)   . Hematemesis   . Palliative care encounter   . Goals of care, counseling/discussion   . Cardiac arrest (HCC) 08/16/2017  . 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 Sepsis (HCC) 05/28/2017    Orientation RESPIRATION BLADDER Height & Weight     Self, Situation, Place, Time  Normal Continent, Indwelling catheter Weight: (!) 302 lb 0.5 oz (137 kg) Height:   (165.1 cm)  BEHAVIORAL SYMPTOMS/MOOD NEUROLOGICAL BOWEL NUTRITION STATUS    Convulsions/Seizures Incontinent Diet (Dysphagia 2 diet)  AMBULATORY STATUS COMMUNICATION OF NEEDS Skin   Limited Assist Verbally Normal                       Personal Care Assistance Level of Assistance  Bathing, Feeding, Dressing Bathing Assistance: Limited assistance Feeding assistance: Independent Dressing Assistance: Limited assistance     Functional Limitations Info  Sight, Hearing, Speech  Sight Info: Adequate Hearing Info: Adequate Speech Info: Adequate    SPECIAL CARE FACTORS FREQUENCY  PT (By licensed PT), OT (By licensed OT), Speech therapy     PT Frequency: 5x a week OT Frequency: 5x a week     Speech Therapy Frequency: 2x a week      Contractures Contractures Info: Not present    Additional Factors Info  Allergies, Code Status, Psychotropic, Insulin Sliding Scale Code Status Info: Penicillins Allergies Info: DNR Psychotropic Info: traZODone (DESYREL) tablet 100 mg Insulin Sliding Scale Info: insulin aspart (novoLOG) injection 0-9 Units 3x a day with meals       Current Medications (08/27/2017):  This is the current hospital active medication list Current Facility-Administered Medications  Medication Dose Route Frequency Provider Last Rate Last Dose  . albumin human 25 % solution 12.5 g  12.5 g Intravenous Daily Mekenna Finau, MD      . feeding supplement (ENSURE ENLIVE) (ENSURE ENLIVE) liquid 237 mL  237 mL Oral TID BM Hester Joslin, MD   237 mL at 08/26/17 1710  . furosemide (LASIX) injection 40 mg  40 mg Intravenous BID Astryd Pearcy, MD      . haloperidol lactate (HALDOL) injection 1-2 mg  1-2 mg Intravenous Q3H PRN Merwyn Katos, MD   2 mg at 08/20/17 1136  . hydrALAZINE (APRESOLINE) injection 10-20 mg  10-20 mg Intravenous Q4H PRN Eugenie Norrie, NP   10 mg at 08/19/17 1323  . insulin aspart (novoLOG)  injection 0-5 Units  0-5 Units Subcutaneous QHS Enid Baas, MD   3 Units at 08/26/17 2238  . insulin aspart (novoLOG) injection 0-9 Units  0-9 Units Subcutaneous TID WC Enid Baas, MD   3 Units at 08/27/17 0851  . insulin aspart (novoLOG) injection 3 Units  3 Units Subcutaneous TID Marga Melnick, MD   3 Units at 08/27/17 0852  . insulin glargine (LANTUS) injection 12 Units  12 Units Subcutaneous QHS Enid Baas, MD   12 Units at 08/26/17 2239  . lactulose (CHRONULAC) 10 GM/15ML solution 20 g  20 g Oral BID Enid Baas, MD    20 g at 08/27/17 0850  . LORazepam (ATIVAN) injection 0.5-1 mg  0.5-1 mg Intravenous Q4H PRN Eugenie Norrie, NP   1 mg at 08/27/17 0348  . nadolol (CORGARD) tablet 20 mg  20 mg Oral Daily Enid Baas, MD   20 mg at 08/27/17 0850  . naphazoline-glycerin (CLEAR EYES) ophth solution 1-2 drop  1-2 drop Both Eyes QID Ramonita Lab, MD   2 drop at 08/27/17 0852  . ondansetron (ZOFRAN) injection 4 mg  4 mg Intravenous Q6H PRN Ihor Austin, MD   4 mg at 08/16/17 1937  . pantoprazole (PROTONIX) injection 40 mg  40 mg Intravenous Q12H Merwyn Katos, MD   40 mg at 08/27/17 1478  . rifaximin (XIFAXAN) tablet 550 mg  550 mg Oral BID Alford Highland, MD   550 mg at 08/27/17 0850  . sodium chloride flush (NS) 0.9 % injection 3 mL  3 mL Intravenous Q12H Enid Baas, MD   3 mL at 08/27/17 0853  . spironolactone (ALDACTONE) tablet 25 mg  25 mg Oral Daily Enid Baas, MD   25 mg at 08/27/17 0850  . traZODone (DESYREL) tablet 100 mg  100 mg Oral QHS Alita Chyle, NP         Discharge Medications: Please see discharge summary for a list of discharge medications.  Relevant Imaging Results:  Relevant Lab Results:   Additional Information SS# [id number]  [place]

## 2017-08-27 NOTE — Progress Notes (Signed)
MEDICATION RELATED CONSULT NOTE - follow  Pharmacy Consult for electrolyte management Indication: hypokalemia  Allergies  Allergen Reactions  . Penicillins Hives and Other (See Comments)    Has patient had a PCN reaction causing immediate rash, facial/tongue/throat swelling, SOB or lightheadedness with hypotension: Yes Has patient had a PCN reaction causing severe rash involving mucus membranes or skin necrosis: No Has patient had a PCN reaction that required hospitalization: No Has patient had a PCN reaction occurring within the last 10 years: No If all of the above answers are "NO", then may proceed with Cephalosporin use.     Patient Measurements: Height:  (165.1 cm) Weight: (!) 302 lb 0.5 oz (137 kg) IBW/kg (Calculated) : 61.5  Vital Signs: Temp: 98.2 F (36.8 C) (09/27 0442) Temp Source: Oral (09/27 0442) BP: 113/64 (09/27 0442) Pulse Rate: 85 (09/27 0442) Intake/Output from previous day: 09/26 0701 - 09/27 0700 In: 1950 [P.O.:1950] Out: 2650 [Urine:1800; Stool:850] Intake/Output from this shift: Total I/O In: 990 [P.O.:990] Out: 800 [Urine:800]  Labs:  Recent Labs  08/25/17 0518  08/26/17 0443 08/26/17 1521 08/27/17 0459  WBC  --   --   --   --  7.0  HGB  --   --   --   --  10.1*  HCT  --   --   --   --  31.0*  PLT  --   --   --   --  59*  CREATININE 1.06  < > 1.02 0.97 0.96  MG 1.7  --  1.7  --   --   ALBUMIN 2.4*  --   --  2.2*  --   PROT 5.7*  --   --  5.7*  --   AST 73*  --   --  95*  --   ALT 131*  --   --  105*  --   ALKPHOS 97  --   --  104  --   BILITOT 11.5*  --   --  12.4*  --   < > = values in this interval not displayed. Estimated Creatinine Clearance: 98.2 mL/min (by C-G formula based on SCr of 0.96 mg/dL).   Microbiology: Recent Results (from the past 720 hour(s))  Blood Culture (routine x 2)     Status: None   Collection Time: 08/16/17 12:50 AM  Result Value Ref Range Status   Specimen Description BLOOD LEFT ARM  Final   Special Requests   Final    BOTTLES DRAWN AEROBIC AND ANAEROBIC Blood Culture adequate volume   Culture NO GROWTH 5 DAYS  Final   Report Status 08/21/2017 FINAL  Final  Blood Culture (routine x 2)     Status: None   Collection Time: 08/16/17 12:51 AM  Result Value Ref Range Status   Specimen Description BLOOD LEFT ANTECUBITAL  Final   Special Requests   Final    BOTTLES DRAWN AEROBIC AND ANAEROBIC Blood Culture adequate volume   Culture NO GROWTH 5 DAYS  Final   Report Status 08/21/2017 FINAL  Final  Urine culture     Status: Abnormal   Collection Time: 08/16/17 12:51 AM  Result Value Ref Range Status   Specimen Description URINE, RANDOM  Final   Special Requests NONE  Final   Culture MULTIPLE SPECIES PRESENT, SUGGEST RECOLLECTION (A)  Final   Report Status 08/17/2017 FINAL  Final  MRSA PCR Screening     Status: None   Collection Time: 08/16/17  4:56 AM  Result  Value Ref Range Status   MRSA by PCR NEGATIVE NEGATIVE Final    Comment:        The GeneXpert MRSA Assay (FDA approved for NASAL specimens only), is one component of a comprehensive MRSA colonization surveillance program. It is not intended to diagnose MRSA infection nor to guide or monitor treatment for MRSA infections.   Body fluid culture     Status: None (Preliminary result)   Collection Time: 08/25/17  9:04 AM  Result Value Ref Range Status   Specimen Description PERITONEAL  Final   Special Requests NONE  Final   Gram Stain   Final    RARE WBC PRESENT, PREDOMINANTLY MONONUCLEAR NO ORGANISMS SEEN    Culture   Final    NO GROWTH < 24 HOURS Performed at [facility], [address], [place], [place] [address]    Report Status PENDING  Incomplete    Medical History: Past Medical History:  Diagnosis Date  . Cirrhosis of liver (HCC)   . Diabetes (HCC)     Medications:  Prescriptions Prior to Admission  Medication Sig Dispense Refill Last Dose  . aspirin 81 MG EC tablet Take 81 mg by mouth daily.  Swallow whole.     . baclofen (LIORESAL) 10 MG tablet Take 10 mg by mouth 2 (two) times daily as needed for muscle spasms.     Marland Kitchen escitalopram (LEXAPRO) 10 MG tablet Take 20 mg by mouth daily.     Marland Kitchen glipiZIDE (GLUCOTROL) 5 MG tablet Take by mouth daily before breakfast.     . HYDROcodone-acetaminophen (NORCO/VICODIN) 5-325 MG tablet Take 1 tablet by mouth 2 (two) times daily as needed for moderate pain.     . hydrOXYzine (ATARAX/VISTARIL) 25 MG tablet Take 25 mg by mouth at bedtime as needed.     Marland Kitchen oxybutynin (DITROPAN) 5 MG tablet Take 5 mg by mouth 2 (two) times daily as needed for bladder spasms.     Marland Kitchen spironolactone (ALDACTONE) 25 MG tablet Take 25 mg by mouth daily.     . tamsulosin (FLOMAX) 0.4 MG CAPS capsule Take 0.4 mg by mouth daily.     . traZODone (DESYREL) 100 MG tablet Take 150 mg by mouth at bedtime.     . furosemide (LASIX) 40 MG tablet Take 40 mg by mouth 2 (two) times daily.    Past Week at Unknown time  . metFORMIN (GLUCOPHAGE) 1000 MG tablet Take 1,000 mg by mouth 2 (two) times daily with a meal.   Past Week at unknown  . pantoprazole (PROTONIX) 40 MG tablet Take 1 tablet (40 mg total) by mouth daily. 30 tablet 0   . primidone (MYSOLINE) 50 MG tablet Take 100 mg by mouth at bedtime.     . [DISCONTINUED] glyBURIDE (DIABETA) 5 MG tablet Take 5 mg by mouth daily.   Past Week at Unknown time  . [DISCONTINUED] insulin aspart (NOVOLOG) 100 UNIT/ML injection Inject 7 Units into the skin 3 (three) times daily with meals. 10 mL 11   . [DISCONTINUED] insulin glargine (LANTUS) 100 UNIT/ML injection Inject 0.34 mLs (34 Units total) into the skin daily. 10 mL 11   . [DISCONTINUED] lactulose (CHRONULAC) 10 GM/15ML solution Take 45 mLs (30 g total) by mouth daily. 240 mL 0   . [DISCONTINUED] levofloxacin (LEVAQUIN) 750 MG tablet Take 1 tablet (750 mg total) by mouth daily. 3 tablet 0   . [DISCONTINUED] traZODone (DESYREL) 50 MG tablet Take 75 mg by mouth at bedtime.  Past Week at Unknown  time   Scheduled:  . feeding supplement (ENSURE ENLIVE)  237 mL Oral TID BM  . insulin aspart  0-5 Units Subcutaneous QHS  . insulin aspart  0-9 Units Subcutaneous TID WC  . insulin aspart  3 Units Subcutaneous TID AC  . insulin glargine  12 Units Subcutaneous QHS  . lactulose  20 g Oral BID  . nadolol  20 mg Oral Daily  . naphazoline-glycerin  1-2 drop Both Eyes QID  . pantoprazole (PROTONIX) IV  40 mg Intravenous Q12H  . rifaximin  550 mg Oral BID  . sodium chloride flush  3 mL Intravenous Q12H  . spironolactone  25 mg Oral Daily  . traZODone  50 mg Oral QHS   Infusions:   PRN: haloperidol lactate, hydrALAZINE, LORazepam, [DISCONTINUED] ondansetron **OR** ondansetron (ZOFRAN) IV Anti-infectives    Start     Dose/Rate Route Frequency Ordered Stop   08/21/17 2200  meropenem (MERREM) 1 g in sodium chloride 0.9 % 100 mL IVPB  Status:  Discontinued     1 g 200 mL/hr over 30 Minutes Intravenous Every 8 hours 08/21/17 1121 08/21/17 1427   08/21/17 2000  ciprofloxacin (CIPRO) tablet 500 mg     500 mg Oral 2 times daily 08/21/17 1427 08/23/17 0814   08/21/17 1500  rifaximin (XIFAXAN) tablet 550 mg     550 mg Oral 2 times daily 08/21/17 1347     08/21/17 1430  metroNIDAZOLE (FLAGYL) tablet 500 mg     500 mg Oral Every 8 hours 08/21/17 1427 08/23/17 0547   08/18/17 2200  levofloxacin (LEVAQUIN) IVPB 750 mg  Status:  Discontinued     750 mg 100 mL/hr over 90 Minutes Intravenous Every 48 hours 08/16/17 0243 08/16/17 0537   08/18/17 1000  meropenem (MERREM) 2 g in sodium chloride 0.9 % 100 mL IVPB  Status:  Discontinued     2 g 200 mL/hr over 30 Minutes Intravenous Every 12 hours 08/18/17 0837 08/21/17 1121   08/16/17 1500  aztreonam (AZACTAM) 1 g in dextrose 5 % 50 mL IVPB  Status:  Discontinued     1 g 100 mL/hr over 30 Minutes Intravenous Every 12 hours 08/16/17 0248 08/16/17 0537   08/16/17 1145  rifaximin (XIFAXAN) tablet 550 mg  Status:  Discontinued     550 mg Oral 2 times daily  08/16/17 1141 08/17/17 0844   08/16/17 0900  vancomycin (VANCOCIN) 1,500 mg in sodium chloride 0.9 % 500 mL IVPB  Status:  Discontinued     1,500 mg 250 mL/hr over 120 Minutes Intravenous Every 24 hours 08/16/17 0243 08/17/17 0844   08/16/17 0545  meropenem (MERREM) 1 g in sodium chloride 0.9 % 100 mL IVPB  Status:  Discontinued     1 g 200 mL/hr over 30 Minutes Intravenous Every 12 hours 08/16/17 0539 08/18/17 0837   08/16/17 0545  metroNIDAZOLE (FLAGYL) IVPB 500 mg  Status:  Discontinued     500 mg 100 mL/hr over 60 Minutes Intravenous Every 8 hours 08/16/17 0539 08/17/17 0844   08/16/17 0415  rifaximin (XIFAXAN) tablet 200 mg     200 mg Per Tube 2 times daily 08/16/17 0410 08/16/17 0506   08/16/17 0230  levofloxacin (LEVAQUIN) IVPB 750 mg     750 mg 100 mL/hr over 90 Minutes Intravenous  Once 08/16/17 0228 08/16/17 0405   08/16/17 0230  aztreonam (AZACTAM) 2 g in dextrose 5 % 50 mL IVPB     2 g 100  mL/hr over 30 Minutes Intravenous  Once 08/16/17 0228 08/16/17 0318   08/16/17 0230  vancomycin (VANCOCIN) IVPB 1000 mg/200 mL premix     1,000 mg 200 mL/hr over 60 Minutes Intravenous  Once 08/16/17 0228 08/16/17 0407      Assessment: Potassium with am labs was 2.9. Replacing with KCl via iv. Will recheck 1 hour after last infusion. Also checking Mg+ level this afternoon, will discuss with Dr Amado Coe during rounds. Patient has hypernatremia, which I paged Dr Amado Coe about, let her know there was no electrolyte protocol for pharmacists to address hypernatremia. Recommended she manage or consult nephrology potentially for their input.  Goal of Therapy:  Electrolytes WNL through gentle adjustment   Plan:  KCL iv q1h x 4 doses, recheck K+ and Mg+ this afternoon. Discuss hypernatremia with Dr Amado Coe this afternoon.   9/25@1420 . Spoke with RN Baltazar Najjar because only 2 of 4 bags KCl have been administered, she states the rate has had to be slowed by significantly due to pt  complaining of burning. Will reschedule K+ to 1800. Pharmacist to follow and replace if necessary.   9/25 1950 K 4.8. No further supplement at this time. Recheck electrolytes with AM labs.   9/26 @ 0500 K 3.4. Will give KCI 30 mEq PO x 2 doses for a estimated K of 4.0 mEq. Will recheck w/ am labs.  9/27 @ 0427 K 4.1 WNL. No further supplementation needed. Will monitor w/ routine labs.  Thomasene Ripple, PharmD, BCPS Clinical Pharmacist 08/27/2017

## 2017-08-27 NOTE — Progress Notes (Signed)
[facility] [place] [facility]   PATIENT NAME: Jesus [name]    MR#:  [id number]  DATE OF BIRTH:  1951-05-06  SUBJECTIVE:  CHIEF COMPLAINT:   Chief Complaint  Patient presents with  . Altered Mental Status  . Hyperglycemia   -patient had paracentesis 9/25, 4 L of fluid was taken out.  LE edema is getting worse    REVIEW OF SYSTEMS:  Review of Systems  Constitutional: Positive for malaise/fatigue. Negative for chills and fever.  HENT: Negative for congestion, ear discharge, hearing loss and nosebleeds.   Respiratory: Negative for cough, shortness of breath and wheezing.   Cardiovascular: Positive for leg swelling. Negative for chest pain and palpitations.  Gastrointestinal: Negative for abdominal pain, constipation, diarrhea, nausea and vomiting.  Genitourinary: Negative for dysuria.  Musculoskeletal: Negative for myalgias.  Neurological: Negative for dizziness, speech change, focal weakness, seizures and headaches.  Psychiatric/Behavioral: Negative for depression.    DRUG ALLERGIES:   Allergies  Allergen Reactions  . Penicillins Hives and Other (See Comments)    Has patient had a PCN reaction causing immediate rash, facial/tongue/throat swelling, SOB or lightheadedness with hypotension: Yes Has patient had a PCN reaction causing severe rash involving mucus membranes or skin necrosis: No Has patient had a PCN reaction that required hospitalization: No Has patient had a PCN reaction occurring within the last 10 years: No If all of the above answers are "NO", then may proceed with Cephalosporin use.     VITALS:  Blood pressure (!) 125/53, pulse 83, temperature 98 F (36.7 C), temperature source Oral, resp. rate 18, height  (1.651 m), weight (!) 137 kg (302 lb 0.5 oz), SpO2 99 %.  PHYSICAL EXAMINATION:  Physical Exam  GENERAL:  66 y.o.-year-old obese patient lying in the bed with no acute distress.  EYES: Pupils equal, round, reactive to  light and accommodation. No scleral icterus. Extraocular muscles intact.  HEENT: Head atraumatic, normocephalic. Oropharynx and nasopharynx clear.  NECK:  Supple, no jugular venous distention. No thyroid enlargement, no tenderness.  LUNGS: Normal breath sounds bilaterally, no wheezing, rales,rhonchi or crepitation. No use of accessory muscles of respiration.  CARDIOVASCULAR: S1, S2 normal. No murmurs, rubs, or gallops.  ABDOMEN: Soft, nontender, very distended. Bowel sounds present. Rectal tube is present EXTREMITIES: 4+ pedal edema, all the way up to the abdominal wall noted. Scrotal edema No  cyanosis, or clubbing.  NEUROLOGIC: Cranial nerves II through XII are intact. Muscle strength 5/5 in all extremities. Sensation intact. Gait not checked. Global weakness noted. PSYCHIATRIC: The patient is alert and oriented x 3.  SKIN: No obvious rash, lesion, or ulcer.    LABORATORY PANEL:   CBC  Recent Labs Lab 08/27/17 0459  WBC 7.0  HGB 10.1*  HCT 31.0*  PLT 59*   ------------------------------------------------------------------------------------------------------------------  Chemistries   Recent Labs Lab 08/26/17 0443 08/26/17 1521 08/27/17 0459  NA 149* 143 141  K 3.4* 3.8 4.1  CL 118* 114* 112*  CO2 GLUCOSE 150* 242* 265*  BUN 22* 22* 23*  CREATININE 1.02 0.97 0.96  CALCIUM 8.4* 8.2* 8.2*  MG 1.7  --   --   AST  --  95*  --   ALT  --  105*  --   ALKPHOS  --  104  --   BILITOT  --  12.4*  --    ------------------------------------------------------------------------------------------------------------------  Cardiac Enzymes No results for input(s): TROPONINI in the last 168 hours. ------------------------------------------------------------------------------------------------------------------  RADIOLOGY:  No  results found.  EKG:   Orders placed or performed during the hospital encounter of 08/16/17  . EKG 12-Lead  . EKG 12-Lead  . ED EKG 12-Lead    . ED EKG 12-Lead  . EKG 12-Lead  . EKG 12-Lead    ASSESSMENT AND PLAN:   66 year old male with past medical his presents to hospital secondary to altered mental status. Also had a cardiac arrest and was in ICU. Currently extubated and transferred to the floor   # anasarca- secondary to liver failure. Albumin is low at 2.2.  - continue  IV albumin for 3 days - started on Lasix gtt and Aldactone. - monitor intake and output -ultrasound abdomen with liver cirrhosis, possible Portal vein thrombosis, spleenomegaly and ascites, patient is not a good or anticoagulation for  Possible portal vein thrombosis as he is at high risk for bleeding - US guided paracentesis done today and 4 L of fluid removed on 08/25/2017 - on empiric cipro and flagyl- stop after 10 days   # acute liver failure with history of liver cirrhosis-transaminases are improving, continue to monitor INR per GI recommendations. Bilirubin is still elevated. 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 Hb stable - off octreotide drip. On Protonix twice a day -stable hemoglobin. -outpatient EGD in 2-3 weeks   # severe hypernatremia and hypokalemia Improved on D5 half-normal saline with 20 K Diet changed to dysphagia type II with thin liquids Oral potassium supplements Repeat electrolytes  # acute encephalopathy-anoxic from cardiac arrest, and also hepatic -improving mental status. Ammonia is slightly elevated. Rpt ammonia level nml - on lactulose.  # DM- lantus, ssi Appreciate diabetes coordinator input  # DVT Prophylaxis- TEDS and SCDS   PT  recommended SNF-  Discussed with daughters- Palliative care is following   All the records are reviewed and case discussed with Care Management/Social Workerr. Management plans discussed with the patient, family and they are in agreement.  CODE STATUS: DNR  TOTAL TIME TAKING CARE OF THIS PATIENT: 35  minutes.   POSSIBLE D/C IN 2-3 DAYS, DEPENDING ON CLINICAL CONDITION.   Ramonita Lab M.D on 08/27/2017 at 4:27 PM  Between 7am to 6pm - Pager - [phone]  After 6pm go to [email or web address] - password [email or web address]  [facility]  Office  [phone]  CC: Primary care physician; System, Pcp Not In

## 2017-08-27 NOTE — Progress Notes (Signed)
Daily Progress Note   Patient Name: Jesus [name]       Date: 08/27/2017 DOB: [birth year]  Age: 66 y.o. MRN#: [id number] Attending Physician: Ramonita Lab, MD Primary Care Physician: System, Pcp Not In Admit Date: 08/16/2017  Reason for Consultation/Follow-up: Establishing goals of care  Subjective/GOC: Patient awake, alert, and oriented. Sitting up in chair this morning. Asking to get back in bed. C/o not getting sleeping pill till 3:30am. Per chart review, patient received trazodone at 10:30pm and Ativan prn during the night. Also c/o of scrotal swelling. Patient will receive lasix and albumin today. Also encouraged nursing to elevate scrotum with towel sling.   Daughter, Arna Medici at bedside. [facility] has accepted him for rehab and they are waiting on a bed. She tells me eye itching/burning has improved from scheduled eye drops. Answered questions and concerns.    Length of Stay: 11  Current Medications: Scheduled Meds:  . feeding supplement (ENSURE ENLIVE)  237 mL Oral TID BM  . furosemide  40 mg Intravenous BID  . insulin aspart  0-5 Units Subcutaneous QHS  . insulin aspart  0-9 Units Subcutaneous TID WC  . insulin aspart  3 Units Subcutaneous TID AC  . insulin glargine  12 Units Subcutaneous QHS  . lactulose  20 g Oral BID  . nadolol  20 mg Oral Daily  . naphazoline-glycerin  1-2 drop Both Eyes QID  . pantoprazole (PROTONIX) IV  40 mg Intravenous Q12H  . rifaximin  550 mg Oral BID  . sodium chloride flush  3 mL Intravenous Q12H  . spironolactone  25 mg Oral Daily  . traZODone  100 mg Oral QHS    Continuous Infusions: . albumin human     PRN Meds: haloperidol lactate, hydrALAZINE, LORazepam, [DISCONTINUED] ondansetron **OR** ondansetron (ZOFRAN) IV  Physical Exam    Constitutional: He is oriented to person, place, and time. He is cooperative.  HENT:  Head: Normocephalic and atraumatic.  Cardiovascular: Regular rhythm.   Pulmonary/Chest: Effort normal.  Abdominal: There is no tenderness.  Neurological: He is alert and oriented to person, place, and time.  Periods of confusion  Skin: Skin is warm and dry.  Psychiatric: He has a normal mood and affect. His speech is normal and behavior is normal.  Nursing note and vitals reviewed.  Vital Signs: BP 113/64 (BP Location: Right Arm)   Pulse 85   Temp 98.2 F (36.8 C) (Oral)   Resp 20   Ht  (1.651 m)   Wt (!) 137 kg (302 lb 0.5 oz)   SpO2 98%   BMI 50.26 kg/m  SpO2: SpO2: 98 % O2 Device: O2 Device: Not Delivered O2 Flow Rate: O2 Flow Rate (L/min): 0 L/min  Intake/output summary:   Intake/Output Summary (Last 24 hours) at 08/27/17 1004 Last data filed at 08/27/17 0950  Gross per 24 hour  Intake             1950 ml  Output             2650 ml  Net             -700 ml   LBM: Last BM Date: 08/26/17 Baseline Weight: Weight: 123.4 kg (272 lb) Most recent weight: Weight: (!) 137 kg (302 lb 0.5 oz)   Palliative Assessment/Data: PPS 50%   Flowsheet Rows     Most Recent Value  Intake Tab  Clinical Assessment  Palliative Performance Scale Score  50%  Psychosocial & Spiritual Assessment  Palliative Care Outcomes  Patient/Family meeting held?  Yes  Who was at the meeting?  patient and daughter  Palliative Care Outcomes  Improved non-pain symptom therapy, Clarified goals of care, Provided psychosocial or spiritual support, Linked to palliative care logitudinal support      Patient Active Problem List   Diagnosis Date Noted  . Palliative care by specialist   . Acute blood loss anemia   . Coagulopathy (HCC)   . Acute encephalopathy   . DNR (do not resuscitate)   . Hemorrhagic shock (HCC)   . AKI (acute kidney injury) (HCC)   . Hepatic cirrhosis (HCC)   . Liver cirrhosis  secondary to NASH (HCC)   . Hematemesis   . Palliative care encounter   . Goals of care, counseling/discussion   . Cardiac arrest (HCC) 08/16/2017  . 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 Sepsis (HCC) 05/28/2017    Palliative Care Assessment & Plan   Patient Profile: 66 y.o. male  with past medical history of NASH cirrhosis, diabetes mellitus, and recurrent anemia (receiving regular transfusions) who was admitted on 08/16/2017 with altered mental status and hyperglycemia.  At home he was found unresponsive. He had been confused and vomiting blood. In the ED he suffered cardiac arrest x2 and was resuscitated. Admission work up showed severe anemia. His lactic acid was elevated (21), Ph 7.1 and he required emergent intubation. Extubated 9/18 and transferred out of ICU. Patient had EGD with banding of varices and cauterization of gastric AV malformations. Abdominal ultrasound with liver cirrhosis, ? Portal vein thrombosis, spleenomegaly, and ascites. Paracentesis 9/25 with 4L removed. Transaminases and mental status improvement.   Assessment: Acute liver failure with hx of liver cirrhosis Hemorrhagic shock GI bleeding Ascites s/p paracentesis Hepatic encephalopathy Hypernatremia Hypokalemia  Recommendations/Plan:  DNR  Increased Trazodone  PO HS. Home dose is .   Plan is SNF for rehab with palliative to follow.    Code Status: DNR   Code Status Orders        Start     Ordered   08/19/17 1335  Do not attempt resuscitation (DNR)  Continuous    Question Answer Comment  In the event of cardiac or respiratory ARREST Do not call a "code blue"   In the event of cardiac or respiratory ARREST Do  not perform Intubation, CPR, defibrillation or ACLS   In the event of cardiac or respiratory ARREST Use medication by any route, position, wound care, and other measures to relive pain and suffering. May use oxygen, suction and manual treatment of airway obstruction  as needed for comfort.      08/19/17 1334    Code Status History    Date Active Date Inactive Code Status Order ID Comments User Context   08/16/2017  4:04 AM 08/19/2017  1:34 PM Full Code [id number]  Ihor Austin, MD Inpatient   05/28/2017  5:15 AM 06/04/2017  8:10 PM Full Code [id number]  Arnaldo Natal, MD Inpatient       Prognosis:   Unable to determine  Discharge Planning:  Skilled Nursing Facility for rehab with Palliative care service follow-up  Care plan was discussed with patient, daughter Arna Medici), RN, RN CM  Thank you for allowing the Palliative Medicine Team to assist in the care of this patient.   Time In: 0940 Time Out: 1005 Total Time Prolonged Time Billed  no      Greater than 50%  of this time was spent counseling and coordinating care related to the above assessment and plan.  Vennie Homans, FNP-C Palliative Medicine Team  Phone: [phone] Fax: [phone]  Please contact Palliative Medicine Team phone at [phone] for questions and concerns.

## 2017-08-28 LAB — CBC
HCT: 31.2 % — ABNORMAL LOW (ref 40.0–52.0)
Hemoglobin: 10.3 g/dL — ABNORMAL LOW (ref 13.0–18.0)
MCH: 30.2 pg (ref 26.0–34.0)
MCHC: 33.1 g/dL (ref 32.0–36.0)
MCV: 91.3 fL (ref 80.0–100.0)
Platelets: 56 10*3/uL — ABNORMAL LOW (ref 150–440)
RBC: 3.42 MIL/uL — ABNORMAL LOW (ref 4.40–5.90)
RDW: 22.8 % — AB (ref 11.5–14.5)
WBC: 5.9 10*3/uL (ref 3.8–10.6)

## 2017-08-28 LAB — COMPREHENSIVE METABOLIC PANEL
ALBUMIN: 2.3 g/dL — AB (ref 3.5–5.0)
ALT: 90 U/L — ABNORMAL HIGH (ref 17–63)
ANION GAP: 8 (ref 5–15)
AST: 105 U/L — ABNORMAL HIGH (ref 15–41)
Alkaline Phosphatase: 128 U/L — ABNORMAL HIGH (ref 38–126)
BUN: 24 mg/dL — ABNORMAL HIGH (ref 6–20)
CO2: 23 mmol/L (ref 22–32)
Calcium: 8.3 mg/dL — ABNORMAL LOW (ref 8.9–10.3)
Chloride: 109 mmol/L (ref 101–111)
Creatinine, Ser: 0.86 mg/dL (ref 0.61–1.24)
GFR calc Af Amer: >60 mL/min (ref >60)
GFR calc non Af Amer: >60 mL/min (ref >60)
GLUCOSE: 253 mg/dL — AB (ref 65–99)
POTASSIUM: 3.8 mmol/L (ref 3.5–5.1)
SODIUM: 140 mmol/L (ref 135–145)
TOTAL PROTEIN: 6 g/dL — AB (ref 6.5–8.1)
Total Bilirubin: 12.3 mg/dL — ABNORMAL HIGH (ref 0.3–1.2)

## 2017-08-28 LAB — GLUCOSE, CAPILLARY
GLUCOSE-CAPILLARY: 268 mg/dL — AB (ref 65–99)
GLUCOSE-CAPILLARY: 252 mg/dL — AB (ref 65–99)
Glucose-Capillary: 192 mg/dL — ABNORMAL HIGH (ref 65–99)
Glucose-Capillary: 264 mg/dL — ABNORMAL HIGH (ref 65–99)

## 2017-08-28 LAB — BODY FLUID CULTURE: Culture: NO GROWTH

## 2017-08-28 LAB — LIPASE, FLUID: Lipase-Fluid: 44 U/L

## 2017-08-28 MED ORDER — INSULIN GLARGINE 100 UNIT/ML ~~LOC~~ SOLN
15.0000 [IU] | Freq: Every day | SUBCUTANEOUS | Status: DC
  Administered 2017-08-28 – 2017-08-30 (×3): 15 [IU] via SUBCUTANEOUS
  Filled 2017-08-28 (×4): qty 0.15

## 2017-08-28 MED ORDER — FUROSEMIDE 10 MG/ML IJ SOLN
8.0000 mg/h | INTRAVENOUS | Status: DC
  Filled 2017-08-28: qty 25

## 2017-08-28 MED ORDER — NYSTATIN 100000 UNIT/GM EX POWD
Freq: Two times a day (BID) | CUTANEOUS | Status: DC
  Administered 2017-08-28 – 2017-09-02 (×11): via TOPICAL
  Filled 2017-08-28: qty 15

## 2017-08-28 MED ORDER — TRAMADOL HCL 50 MG PO TABS
50.0000 mg | ORAL_TABLET | Freq: Four times a day (QID) | ORAL | Status: DC | PRN
  Administered 2017-08-28 – 2017-09-01 (×3): 50 mg via ORAL
  Filled 2017-08-28 (×4): qty 1

## 2017-08-28 MED ORDER — INSULIN ASPART 100 UNIT/ML ~~LOC~~ SOLN
6.0000 [IU] | Freq: Three times a day (TID) | SUBCUTANEOUS | Status: DC
  Administered 2017-08-28 – 2017-09-02 (×15): 6 [IU] via SUBCUTANEOUS
  Filled 2017-08-28 (×15): qty 1

## 2017-08-28 MED ORDER — LACTULOSE 10 GM/15ML PO SOLN
20.0000 g | Freq: Every day | ORAL | Status: DC
  Administered 2017-08-29 – 2017-08-31 (×3): 20 g via ORAL
  Filled 2017-08-28 (×3): qty 30

## 2017-08-28 NOTE — Progress Notes (Signed)
VSS. Patient pleasant. Family visiting. IV clean, dry, and intact, infusing at 78ml/hr. Put in a request for a bariatric bed, waiting for approval. Also put in a wound care consult for scrotum, and buttocks. Patient resting comfortably in bed.

## 2017-08-28 NOTE — Progress Notes (Signed)
MEDICATION RELATED CONSULT NOTE - follow  Pharmacy Consult for electrolyte management Indication: hypokalemia  Allergies  Allergen Reactions  . Penicillins Hives and Other (See Comments)    Has patient had a PCN reaction causing immediate rash, facial/tongue/throat swelling, SOB or lightheadedness with hypotension: Yes Has patient had a PCN reaction causing severe rash involving mucus membranes or skin necrosis: No Has patient had a PCN reaction that required hospitalization: No Has patient had a PCN reaction occurring within the last 10 years: No If all of the above answers are "NO", then may proceed with Cephalosporin use.     Patient Measurements: Height:  (165.1 cm) Weight: (!) 302 lb 0.5 oz (137 kg) IBW/kg (Calculated) : 61.5  Vital Signs: Temp: 98.6 F (37 C) (09/28 0750) Temp Source: Oral (09/28 0750) BP: 110/59 (09/28 0750) Pulse Rate: 77 (09/28 0750) Intake/Output from previous day: 09/27 0701 - 09/28 0700 In: 695.9 [P.O.:600; I.V.:45.9; IV Piggyback:50] Out: 1150 [Urine:1150] Intake/Output from this shift: Total I/O In: 360 [P.O.:360] Out: -   Labs:  Recent Labs  08/26/17 0443 08/26/17 1521 08/27/17 0459 08/28/17 0504  WBC  --   --  7.0 5.9  HGB  --   --  10.1* 10.3*  HCT  --   --  31.0* 31.2*  PLT  --   --  59* 56*  CREATININE 1.02 0.97 0.96 0.86  MG 1.7  --   --   --   ALBUMIN  --  2.2*  --  2.3*  PROT  --  5.7*  --  6.0*  AST  --  95*  --  105*  ALT  --  105*  --  90*  ALKPHOS  --  104  --  128*  BILITOT  --  12.4*  --  12.3*   Estimated Creatinine Clearance: 109.6 mL/min (by C-G formula based on SCr of 0.86 mg/dL).   Microbiology: Recent Results (from the past 720 hour(s))  Blood Culture (routine x 2)     Status: None   Collection Time: 08/16/17 12:50 AM  Result Value Ref Range Status   Specimen Description BLOOD LEFT ARM  Final   Special Requests   Final    BOTTLES DRAWN AEROBIC AND ANAEROBIC Blood Culture adequate volume   Culture NO GROWTH 5 DAYS  Final   Report Status 08/21/2017 FINAL  Final  Blood Culture (routine x 2)     Status: None   Collection Time: 08/16/17 12:51 AM  Result Value Ref Range Status   Specimen Description BLOOD LEFT ANTECUBITAL  Final   Special Requests   Final    BOTTLES DRAWN AEROBIC AND ANAEROBIC Blood Culture adequate volume   Culture NO GROWTH 5 DAYS  Final   Report Status 08/21/2017 FINAL  Final  Urine culture     Status: Abnormal   Collection Time: 08/16/17 12:51 AM  Result Value Ref Range Status   Specimen Description URINE, RANDOM  Final   Special Requests NONE  Final   Culture MULTIPLE SPECIES PRESENT, SUGGEST RECOLLECTION (A)  Final   Report Status 08/17/2017 FINAL  Final  MRSA PCR Screening     Status: None   Collection Time: 08/16/17  4:56 AM  Result Value Ref Range Status   MRSA by PCR NEGATIVE NEGATIVE Final    Comment:        The GeneXpert MRSA Assay (FDA approved for NASAL specimens only), is one component of a comprehensive MRSA colonization surveillance program. It is not intended to diagnose  MRSA infection nor to guide or monitor treatment for MRSA infections.   Body fluid culture     Status: None (Preliminary result)   Collection Time: 08/25/17  9:04 AM  Result Value Ref Range Status   Specimen Description PERITONEAL  Final   Special Requests NONE  Final   Gram Stain   Final    RARE WBC PRESENT, PREDOMINANTLY MONONUCLEAR NO ORGANISMS SEEN    Culture   Final    NO GROWTH 2 DAYS Performed at [facility], [address], [place], [place] [address]    Report Status PENDING  Incomplete    Medical History: Past Medical History:  Diagnosis Date  . Cirrhosis of liver (HCC)   . Diabetes (HCC)     Medications:  Prescriptions Prior to Admission  Medication Sig Dispense Refill Last Dose  . aspirin 81 MG EC tablet Take 81 mg by mouth daily. Swallow whole.     . baclofen (LIORESAL) 10 MG tablet Take 10 mg by mouth 2 (two) times daily as  needed for muscle spasms.     Marland Kitchen escitalopram (LEXAPRO) 10 MG tablet Take 20 mg by mouth daily.     Marland Kitchen glipiZIDE (GLUCOTROL) 5 MG tablet Take by mouth daily before breakfast.     . HYDROcodone-acetaminophen (NORCO/VICODIN) 5-325 MG tablet Take 1 tablet by mouth 2 (two) times daily as needed for moderate pain.     . hydrOXYzine (ATARAX/VISTARIL) 25 MG tablet Take 25 mg by mouth at bedtime as needed.     Marland Kitchen oxybutynin (DITROPAN) 5 MG tablet Take 5 mg by mouth 2 (two) times daily as needed for bladder spasms.     Marland Kitchen spironolactone (ALDACTONE) 25 MG tablet Take 25 mg by mouth daily.     . tamsulosin (FLOMAX) 0.4 MG CAPS capsule Take 0.4 mg by mouth daily.     . traZODone (DESYREL) 100 MG tablet Take 150 mg by mouth at bedtime.     . furosemide (LASIX) 40 MG tablet Take 40 mg by mouth 2 (two) times daily.    Past Week at Unknown time  . metFORMIN (GLUCOPHAGE) 1000 MG tablet Take 1,000 mg by mouth 2 (two) times daily with a meal.   Past Week at unknown  . pantoprazole (PROTONIX) 40 MG tablet Take 1 tablet (40 mg total) by mouth daily. 30 tablet 0   . primidone (MYSOLINE) 50 MG tablet Take 100 mg by mouth at bedtime.     . [DISCONTINUED] glyBURIDE (DIABETA) 5 MG tablet Take 5 mg by mouth daily.   Past Week at Unknown time  . [DISCONTINUED] insulin aspart (NOVOLOG) 100 UNIT/ML injection Inject 7 Units into the skin 3 (three) times daily with meals. 10 mL 11   . [DISCONTINUED] insulin glargine (LANTUS) 100 UNIT/ML injection Inject 0.34 mLs (34 Units total) into the skin daily. 10 mL 11   . [DISCONTINUED] lactulose (CHRONULAC) 10 GM/15ML solution Take 45 mLs (30 g total) by mouth daily. 240 mL 0   . [DISCONTINUED] levofloxacin (LEVAQUIN) 750 MG tablet Take 1 tablet (750 mg total) by mouth daily. 3 tablet 0   . [DISCONTINUED] traZODone (DESYREL) 50 MG tablet Take 75 mg by mouth at bedtime.    Past Week at Unknown time   Scheduled:  . feeding supplement (ENSURE ENLIVE)  237 mL Oral TID BM  . insulin aspart   0-5 Units Subcutaneous QHS  . insulin aspart  0-9 Units Subcutaneous TID WC  . insulin aspart  3 Units Subcutaneous TID  AC  . insulin glargine  12 Units Subcutaneous QHS  . lactulose  20 g Oral BID  . nadolol  20 mg Oral Daily  . naphazoline-glycerin  1-2 drop Both Eyes QID  . pantoprazole (PROTONIX) IV  40 mg Intravenous Q12H  . rifaximin  550 mg Oral BID  . sodium chloride flush  3 mL Intravenous Q12H  . spironolactone  25 mg Oral Daily  . traZODone  100 mg Oral QHS   Infusions:  . albumin human 12.5 g (08/28/17 0945)  . furosemide (LASIX) infusion 4 mg/hr (08/28/17 0423)   PRN: haloperidol lactate, hydrALAZINE, LORazepam, [DISCONTINUED] ondansetron **OR** ondansetron (ZOFRAN) IV Anti-infectives    Start     Dose/Rate Route Frequency Ordered Stop   08/21/17 2200  meropenem (MERREM) 1 g in sodium chloride 0.9 % 100 mL IVPB  Status:  Discontinued     1 g 200 mL/hr over 30 Minutes Intravenous Every 8 hours 08/21/17 1121 08/21/17 1427   08/21/17 2000  ciprofloxacin (CIPRO) tablet 500 mg     500 mg Oral 2 times daily 08/21/17 1427 08/23/17 0814   08/21/17 1500  rifaximin (XIFAXAN) tablet 550 mg     550 mg Oral 2 times daily 08/21/17 1347     08/21/17 1430  metroNIDAZOLE (FLAGYL) tablet 500 mg     500 mg Oral Every 8 hours 08/21/17 1427 08/23/17 0547   08/18/17 2200  levofloxacin (LEVAQUIN) IVPB 750 mg  Status:  Discontinued     750 mg 100 mL/hr over 90 Minutes Intravenous Every 48 hours 08/16/17 0243 08/16/17 0537   08/18/17 1000  meropenem (MERREM) 2 g in sodium chloride 0.9 % 100 mL IVPB  Status:  Discontinued     2 g 200 mL/hr over 30 Minutes Intravenous Every 12 hours 08/18/17 0837 08/21/17 1121   08/16/17 1500  aztreonam (AZACTAM) 1 g in dextrose 5 % 50 mL IVPB  Status:  Discontinued     1 g 100 mL/hr over 30 Minutes Intravenous Every 12 hours 08/16/17 0248 08/16/17 0537   08/16/17 1145  rifaximin (XIFAXAN) tablet 550 mg  Status:  Discontinued     550 mg Oral 2 times daily  08/16/17 1141 08/17/17 0844   08/16/17 0900  vancomycin (VANCOCIN) 1,500 mg in sodium chloride 0.9 % 500 mL IVPB  Status:  Discontinued     1,500 mg 250 mL/hr over 120 Minutes Intravenous Every 24 hours 08/16/17 0243 08/17/17 0844   08/16/17 0545  meropenem (MERREM) 1 g in sodium chloride 0.9 % 100 mL IVPB  Status:  Discontinued     1 g 200 mL/hr over 30 Minutes Intravenous Every 12 hours 08/16/17 0539 08/18/17 0837   08/16/17 0545  metroNIDAZOLE (FLAGYL) IVPB 500 mg  Status:  Discontinued     500 mg 100 mL/hr over 60 Minutes Intravenous Every 8 hours 08/16/17 0539 08/17/17 0844   08/16/17 0415  rifaximin (XIFAXAN) tablet 200 mg     200 mg Per Tube 2 times daily 08/16/17 0410 08/16/17 0506   08/16/17 0230  levofloxacin (LEVAQUIN) IVPB 750 mg     750 mg 100 mL/hr over 90 Minutes Intravenous  Once 08/16/17 0228 08/16/17 0405   08/16/17 0230  aztreonam (AZACTAM) 2 g in dextrose 5 % 50 mL IVPB     2 g 100 mL/hr over 30 Minutes Intravenous  Once 08/16/17 0228 08/16/17 0318   08/16/17 0230  vancomycin (VANCOCIN) IVPB 1000 mg/200 mL premix     1,000 mg 200 mL/hr over  60 Minutes Intravenous  Once 08/16/17 0228 08/16/17 0407      Assessment: Potassium with am labs was 2.9. Replacing with KCl via iv. Will recheck 1 hour after last infusion. Also checking Mg+ level this afternoon, will discuss with Dr Amado Coe during rounds. Patient has hypernatremia, which I paged Dr Amado Coe about, let her know there was no electrolyte protocol for pharmacists to address hypernatremia. Recommended she manage or consult nephrology potentially for their input.  Goal of Therapy:  Electrolytes WNL through gentle adjustment   Plan:  KCL iv q1h x 4 doses, recheck K+ and Mg+ this afternoon. Discuss hypernatremia with Dr Amado Coe this afternoon.   9/25@1420 . Spoke with RN Baltazar Najjar because only 2 of 4 bags KCl have been administered, she states the rate has had to be slowed by significantly due to pt  complaining of burning. Will reschedule K+ to 1800. Pharmacist to follow and replace if necessary.   9/25 1950 K 4.8. No further supplement at this time. Recheck electrolytes with AM labs.   9/26 @ 0500 K 3.4. Will give KCI 30 mEq PO x 2 doses for a estimated K of 4.0 mEq. Will recheck w/ am labs.  9/27 @ 0427 K 4.1 WNL. No further supplementation needed. Will monitor w/ routine labs.   9/28 @ 1035 K 3.8 WNL. No further supplementation needed. Will monitor w/ routine labs.   Luan Pulling, PharmD, MBA, [employer] Clinical Pharmacist [facility]    Clinical Pharmacist 08/28/2017

## 2017-08-28 NOTE — Progress Notes (Signed)
[facility] [place] [facility]   PATIENT NAME: Jesus [name]    MR#:  [id number]  DATE OF BIRTH:  01/10/1951  SUBJECTIVE:  CHIEF COMPLAINT:   Chief Complaint  Patient presents with  . Altered Mental Status  . Hyperglycemia   -patient had paracentesis 9/25, 4 L of fluid was taken out.  LE edema is slightly better    REVIEW OF SYSTEMS:  Review of Systems  Constitutional: Positive for malaise/fatigue. Negative for chills and fever.  HENT: Negative for congestion, ear discharge, hearing loss and nosebleeds.   Respiratory: Negative for cough, shortness of breath and wheezing.   Cardiovascular: Positive for leg swelling. Negative for chest pain and palpitations.  Gastrointestinal: Negative for abdominal pain, constipation, diarrhea, nausea and vomiting.  Genitourinary: Negative for dysuria.  Musculoskeletal: Negative for myalgias.  Neurological: Negative for dizziness, speech change, focal weakness, seizures and headaches.  Psychiatric/Behavioral: Negative for depression.    DRUG ALLERGIES:   Allergies  Allergen Reactions  . Penicillins Hives and Other (See Comments)    Has patient had a PCN reaction causing immediate rash, facial/tongue/throat swelling, SOB or lightheadedness with hypotension: Yes Has patient had a PCN reaction causing severe rash involving mucus membranes or skin necrosis: No Has patient had a PCN reaction that required hospitalization: No Has patient had a PCN reaction occurring within the last 10 years: No If all of the above answers are "NO", then may proceed with Cephalosporin use.     VITALS:  Blood pressure 108/62, pulse 67, temperature 98.9 F (37.2 C), temperature source Oral, resp. rate 16, height  (1.651 m), weight (!) 137 kg (302 lb 0.5 oz), SpO2 98 %.  PHYSICAL EXAMINATION:  Physical Exam  GENERAL:  66 y.o.-year-old obese patient lying in the bed with no acute distress.  EYES: Pupils equal, round, reactive to  light and accommodation. No scleral icterus. Extraocular muscles intact.  HEENT: Head atraumatic, normocephalic. Oropharynx and nasopharynx clear.  NECK:  Supple, no jugular venous distention. No thyroid enlargement, no tenderness.  LUNGS: Normal breath sounds bilaterally, no wheezing, rales,rhonchi or crepitation. No use of accessory muscles of respiration.  CARDIOVASCULAR: S1, S2 normal. No murmurs, rubs, or gallops.  ABDOMEN: Soft, nontender, very distended. Bowel sounds present. Rectal tube is present EXTREMITIES: 4+ pedal edema, all the way up to the abdominal wall noted. Scrotal edema No  cyanosis, or clubbing.  NEUROLOGIC: Cranial nerves II through XII are intact. Muscle strength 5/5 in all extremities. Sensation intact. Gait not checked. Global weakness noted. PSYCHIATRIC: The patient is alert and oriented x 3.  SKIN: No obvious rash, lesion, or ulcer.    LABORATORY PANEL:   CBC  Recent Labs Lab 08/28/17 0504  WBC 5.9  HGB 10.3*  HCT 31.2*  PLT 56*   ------------------------------------------------------------------------------------------------------------------  Chemistries   Recent Labs Lab 08/26/17 0443  08/28/17 0504  NA 149*  < > 140  K 3.4*  < > 3.8  CL 118*  < > 109  CO2 25  < > 23  GLUCOSE 150*  < > 253*  BUN 22*  < > 24*  CREATININE 1.02  < > 0.86  CALCIUM 8.4*  < > 8.3*  MG 1.7  --   --   AST  --   < > 105*  ALT  --   < > 90*  ALKPHOS  --   < > 128*  BILITOT  --   < > 12.3*  < > = values in this  interval not displayed. ------------------------------------------------------------------------------------------------------------------  Cardiac Enzymes No results for input(s): TROPONINI in the last 168 hours. ------------------------------------------------------------------------------------------------------------------  RADIOLOGY:  No results found.  EKG:   Orders placed or performed during the hospital encounter of 08/16/17  . EKG 12-Lead    . EKG 12-Lead  . ED EKG 12-Lead  . ED EKG 12-Lead  . EKG 12-Lead  . EKG 12-Lead    ASSESSMENT AND PLAN:   66 year old male with past medical his presents to hospital secondary to altered mental status. Also had a cardiac arrest and was in ICU. Currently extubated and transferred to the floor   # anasarca- secondary to liver failure. Albumin is low at 2.3.  - continue  IV albumin for 3 days - titrate Lasix gtt and Aldactone. - monitor intake and output -ultrasound abdomen with liver cirrhosis, possible Portal vein thrombosis, spleenomegaly and ascites, patient is not a good or anticoagulation for  Possible portal vein thrombosis as he is at high risk for bleeding - US guided paracentesis done today and 4 L of fluid removed on 08/25/2017 - on empiric cipro and flagyl- stop after 10 days   # acute liver failure with history of liver cirrhosis-transaminases are improving, continue to monitor INR per GI recommendations. F/u LFTs on rifaximin On nadolol-dose increased,discharge home with Nadolol to prevent bleeding from diuresis S/p paracentesis- 4 lit fluid removed 08/25/17 Marjo Bicker Pughs score 13 ; translates to one- and two-year patient survival,palliative care is following   # Hemorrhagic shock- was in ICU, received transfusion - appreciate GI consult, status post EGD and banding of varices and cauterization of gastric AV malformations. Hb stable - off octreotide drip. On Protonix twice a day -stable hemoglobin. -outpatient EGD in 2-3 weeks   # severe hypernatremia and hypokalemia Improved on D5 half-normal saline with 20 K Diet changed to dysphagia type II with thin liquids Oral potassium supplements Repeat electrolytes  # acute encephalopathy-anoxic from cardiac arrest, and also hepatic -improving mental status. Ammonia is slightly elevated. Rpt ammonia level nml - on lactulose.  # DM- lantus, ssi Appreciate diabetes coordinator input  # DVT Prophylaxis- TEDS and  SCDS   PT recommended SNF- bed is available, anticipate discharge on Monday if clinically stable Discussed with daughters- Palliative care is following   All the records are reviewed and case discussed with Care Management/Social Workerr. Management plans discussed with the patient, wife and daughter at bedside and they are in agreement.  CODE STATUS: DNR  TOTAL TIME TAKING CARE OF THIS PATIENT: 35  minutes.   POSSIBLE D/C IN 2  DAYS, DEPENDING ON CLINICAL CONDITION.   Ramonita Lab M.D on 08/28/2017 at 2:34 PM  Between 7am to 6pm - Pager - [phone]  After 6pm go to [email or web address] - password [employer]  [facility]  Office  [phone]  CC: Primary care physician; System, Pcp Not In

## 2017-08-28 NOTE — Progress Notes (Signed)
Inpatient Diabetes Program Recommendations  AACE/ADA: New Consensus Statement on Inpatient Glycemic Control (2015)  Target Ranges:  Prepandial:   less than 140 mg/dL      Peak postprandial:   less than 180 mg/dL (1-2 hours)      Critically ill patients:  140 - 180 mg/dL   Results for Jesus [name], Jesus [name] (MRN [id number]) as of 08/28/2017 08:56  Ref. Range 08/27/2017 07:36 08/27/2017 11:49 08/27/2017 16:55 08/27/2017 20:58 08/27/2017 22:12  Glucose-Capillary Latest Ref Range: 65 - 99 mg/dL 409 (H)  6 units Novolog total 223 (H)  6 units Novolog total 275 (H)  8 units Novolog total 250 (H) 239 (H)  2 units Novolog +   12 units Lantus   Results for Jesus [name], Jesus [name] (MRN [id number]) as of 08/28/2017 08:56  Ref. Range 08/28/2017 07:27  Glucose-Capillary Latest Ref Range: 65 - 99 mg/dL 956 (H)    Home DM Meds: Lantus 34 units daily Novolog 7 units TID Glyburide 5 mg daily Metformin 1000 mg BID  Current Insulin Orders: Lantus 12 units QHS Novolog Sensitive Correction Scale/ SSI (0-9 units) TID AC + HS      Novolog 3 units TID with meals      MD- Please consider the following in-hospital insulin adjustments:  1. Increase Lantus slightly to 15 units QHS  2. Increase Novolog Meal Coverage to: Novolog 6 units TID with meals     --Will follow patient during hospitalization--  Ambrose Finland RN, MSN, CDE Diabetes Coordinator Inpatient Glycemic Control Team Team Pager: [phone] (8a-5p)

## 2017-08-28 NOTE — Progress Notes (Signed)
Sent text page to [facility] doc for orders for desitin for incontinence related skin damage to buttocks and scrotum.  Also asked for order to flush foley as there is some leakage around insertion site.  Return call pending. Foley care provided and tube secured. Henriette Combs RN

## 2017-08-29 LAB — BASIC METABOLIC PANEL
Anion gap: 8 (ref 5–15)
BUN: 24 mg/dL — AB (ref 6–20)
CHLORIDE: 104 mmol/L (ref 101–111)
CO2: 25 mmol/L (ref 22–32)
CREATININE: 1.15 mg/dL (ref 0.61–1.24)
Calcium: 8 mg/dL — ABNORMAL LOW (ref 8.9–10.3)
GFR calc Af Amer: >60 mL/min (ref >60)
GFR calc non Af Amer: >60 mL/min (ref >60)
Glucose, Bld: 220 mg/dL — ABNORMAL HIGH (ref 65–99)
POTASSIUM: 3.4 mmol/L — AB (ref 3.5–5.1)
SODIUM: 137 mmol/L (ref 135–145)

## 2017-08-29 LAB — GLUCOSE, CAPILLARY
GLUCOSE-CAPILLARY: 283 mg/dL — AB (ref 65–99)
Glucose-Capillary: 170 mg/dL — ABNORMAL HIGH (ref 65–99)
Glucose-Capillary: 226 mg/dL — ABNORMAL HIGH (ref 65–99)
Glucose-Capillary: 222 mg/dL — ABNORMAL HIGH (ref 65–99)

## 2017-08-29 MED ORDER — FUROSEMIDE 10 MG/ML IJ SOLN
40.0000 mg | Freq: Two times a day (BID) | INTRAMUSCULAR | Status: DC
  Administered 2017-08-29: 17:00:00 40 mg via INTRAVENOUS
  Filled 2017-08-29: qty 4

## 2017-08-29 MED ORDER — FUROSEMIDE 10 MG/ML IJ SOLN
8.0000 mg/h | INTRAVENOUS | Status: DC
  Administered 2017-08-29: 8 mg/h via INTRAVENOUS
  Filled 2017-08-29: qty 25

## 2017-08-29 MED ORDER — POTASSIUM CHLORIDE CRYS ER 20 MEQ PO TBCR
40.0000 meq | EXTENDED_RELEASE_TABLET | Freq: Two times a day (BID) | ORAL | Status: DC
  Administered 2017-08-29 – 2017-08-31 (×5): 40 meq via ORAL
  Filled 2017-08-29 (×5): qty 2

## 2017-08-29 NOTE — Progress Notes (Signed)
Physical Therapy Treatment Patient Details Name: Jesus [name] MRN: [id number] DOB: 06-06-1951 Today's Date: 08/29/2017    History of Present Illness Pt admitted for cardiac arrest with complaints of AMS and hematemesis. PMH of cirrhosis, chronic iron deficiency, DM, hyperglycemia, cardiac arrest. Pt had two episodes of cardiac arrest in ED, went through 2 rounds of CPR, intubated from 9/16-9/18. Had severe GI bleed post EGD on 9/17, required multiple transfusions.    PT Comments    Pt reports feeling better today and wanting to walk. Participated in exercises as described below.  To edge of bed with max a x 1, min a x 1.  Stood with min a x 2 and transferred to commode at bedside per his request.  Pt with trace loose BM which he continued to leak during session.  He was able to ambulate 5' with walker and min guard +2 for safety before stating he was feeling "funny"  Pulled commode chair back under him and he stated he felt better but remained "weak".  Commode chair was spun around and he transferred back into bed with min guard x 2 but required max a x 2 to return to supine due to bed height.  Stated he felt better upon returning to supine.  Check orthostatic BP's next session.  Pt was given a new pressure relief bed since last session.  Bed does not go as low as standard beds and makes getting on/off difficult for patient.  He requires +2/3 to get on/off bed due to his short stature and edema limiting LE mobility.     Follow Up Recommendations  SNF     Equipment Recommendations  Rolling walker with 5" wheels    Recommendations for Other Services       Precautions / Restrictions Precautions Precautions: Fall Precaution Comments: Orthostatic BP's next session Restrictions Weight Bearing Restrictions: No    Mobility  Bed Mobility Overal bed mobility: Needs Assistance Bed Mobility: Supine to Sit Rolling: Max assist   Supine to sit: Max assist;+2 for physical assistance         Transfers Overall transfer level: Needs assistance Equipment used: Rolling walker (2 wheeled) Transfers: Sit to/from Stand Sit to Stand: Min guard;+2 physical assistance            Ambulation/Gait   Ambulation Distance (Feet): 5 Feet Assistive device: Rolling walker (2 wheeled)       General Gait Details: limited by dizziness - ? orthostatic   Stairs            Wheelchair Mobility    Modified Rankin (Stroke Patients Only)       Balance Overall balance assessment: Needs assistance Sitting-balance support: Feet supported Sitting balance-Leahy Scale: Good     Standing balance support: Bilateral upper extremity supported Standing balance-Leahy Scale: Good                              Cognition Arousal/Alertness: Awake/alert Behavior During Therapy: WFL for tasks assessed/performed Overall Cognitive Status: Within Functional Limits for tasks assessed                                        Exercises Other Exercises Other Exercises: B ankle pumps, heel slides, SLR, Ab/add x 10 supine Other Exercises: commode per his request - trace loose BM    General Comments  Pertinent Vitals/Pain Pain Assessment: 0-10 Pain Score: 5  Pain Location: scrotum Pain Descriptors / Indicators: Sore Pain Intervention(s): Monitored during session    Home Living                      Prior Function            PT Goals (current goals can now be found in the care plan section) Progress towards PT goals: Progressing toward goals    Frequency    Min 2X/week      PT Plan Current plan remains appropriate    Co-evaluation              AM-PAC PT "6 Clicks" Daily Activity  Outcome Measure  Difficulty turning over in bed (including adjusting bedclothes, sheets and blankets)?: Unable Difficulty moving from lying on back to sitting on the side of the bed? : Unable Difficulty sitting down on and standing up from a  chair with arms (e.g., wheelchair, bedside commode, etc,.)?: Unable Help needed moving to and from a bed to chair (including a wheelchair)?: A Little Help needed walking in hospital room?: A Little Help needed climbing 3-5 steps with a railing? : Total 6 Click Score: 10    End of Session Equipment Utilized During Treatment: Gait belt Activity Tolerance: Patient tolerated treatment well Patient left: in bed;with bed alarm set;with call bell/phone within reach;with nursing/sitter in room Nurse Communication: Other (comment)       Time: 1610-9604 PT Time Calculation (min) (ACUTE ONLY): 38 min  Charges:  $Gait Training: 8-22 mins $Therapeutic Exercise: 8-22 mins $Therapeutic Activity: 8-22 mins                    G Codes:       Danielle Dess, PTA 08/29/17, 10:35 AM

## 2017-08-29 NOTE — Progress Notes (Addendum)
Pt reports feeling better. PT in with pt stood and walked few steps at bedside then had to return to bed due to feeling weak. Eating well. Encourage do active exercises while in bed of arms/legs. Generalized edema less than past week. Lasix drip given as ordered and then to switch to IV push lasix with po K+. Family in. Pt drinking and requesting excessive po's with limit done with education.

## 2017-08-29 NOTE — Progress Notes (Signed)
Paged Dr Renae Gloss- he does not want to order specific fluid restrictions, but does want [place] to encourage pt to drink primarily the beverages on his meal trays/supplement and avoid excess amounts of other po's. This is related to his kidney function.

## 2017-08-29 NOTE — Progress Notes (Signed)
MEDICATION RELATED CONSULT NOTE - follow  Pharmacy Consult for electrolyte management Indication: hypokalemia  Allergies  Allergen Reactions  . Penicillins Hives and Other (See Comments)    Has patient had a PCN reaction causing immediate rash, facial/tongue/throat swelling, SOB or lightheadedness with hypotension: Yes Has patient had a PCN reaction causing severe rash involving mucus membranes or skin necrosis: No Has patient had a PCN reaction that required hospitalization: No Has patient had a PCN reaction occurring within the last 10 years: No If all of the above answers are "NO", then may proceed with Cephalosporin use.     Patient Measurements: Height:  (165.1 cm) Weight: (!) 302 lb 0.5 oz (137 kg) IBW/kg (Calculated) : 61.5  Vital Signs: Temp: 97.9 F (36.6 C) (09/29 0459) Temp Source: Oral (09/29 0459) BP: 106/52 (09/29 0928) Pulse Rate: 83 (09/29 0928) Intake/Output from previous day: 09/28 0701 - 09/29 0700 In: 1538 [P.O.:1360; I.V.:128; IV Piggyback:50] Out: 3703 [Urine:3700; Stool:3] Intake/Output from this shift: Total I/O In: 240 [P.O.:240] Out: 300 [Urine:300]  Labs:  Recent Labs  08/26/17 1521 08/27/17 0459 08/28/17 0504  WBC  --  7.0 5.9  HGB  --  10.1* 10.3*  HCT  --  31.0* 31.2*  PLT  --  59* 56*  CREATININE 0.97 0.96 0.86  ALBUMIN 2.2*  --  2.3*  PROT 5.7*  --  6.0*  AST 95*  --  105*  ALT 105*  --  90*  ALKPHOS 104  --  128*  BILITOT 12.4*  --  12.3*   Estimated Creatinine Clearance: 109.6 mL/min (by C-G formula based on SCr of 0.86 mg/dL).   Medical History: Past Medical History:  Diagnosis Date  . Cirrhosis of liver (HCC)   . Diabetes (HCC)     Medications:  Prescriptions Prior to Admission  Medication Sig Dispense Refill Last Dose  . aspirin 81 MG EC tablet Take 81 mg by mouth daily. Swallow whole.     . baclofen (LIORESAL) 10 MG tablet Take 10 mg by mouth 2 (two) times daily as needed for muscle spasms.     Marland Kitchen  escitalopram (LEXAPRO) 10 MG tablet Take 20 mg by mouth daily.     Marland Kitchen glipiZIDE (GLUCOTROL) 5 MG tablet Take by mouth daily before breakfast.     . HYDROcodone-acetaminophen (NORCO/VICODIN) 5-325 MG tablet Take 1 tablet by mouth 2 (two) times daily as needed for moderate pain.     . hydrOXYzine (ATARAX/VISTARIL) 25 MG tablet Take 25 mg by mouth at bedtime as needed.     Marland Kitchen oxybutynin (DITROPAN) 5 MG tablet Take 5 mg by mouth 2 (two) times daily as needed for bladder spasms.     Marland Kitchen spironolactone (ALDACTONE) 25 MG tablet Take 25 mg by mouth daily.     . tamsulosin (FLOMAX) 0.4 MG CAPS capsule Take 0.4 mg by mouth daily.     . traZODone (DESYREL) 100 MG tablet Take 150 mg by mouth at bedtime.     . furosemide (LASIX) 40 MG tablet Take 40 mg by mouth 2 (two) times daily.    Past Week at Unknown time  . metFORMIN (GLUCOPHAGE) 1000 MG tablet Take 1,000 mg by mouth 2 (two) times daily with a meal.   Past Week at unknown  . pantoprazole (PROTONIX) 40 MG tablet Take 1 tablet (40 mg total) by mouth daily. 30 tablet 0   . primidone (MYSOLINE) 50 MG tablet Take 100 mg by mouth at bedtime.     . [DISCONTINUED]  glyBURIDE (DIABETA) 5 MG tablet Take 5 mg by mouth daily.   Past Week at Unknown time  . [DISCONTINUED] insulin aspart (NOVOLOG) 100 UNIT/ML injection Inject 7 Units into the skin 3 (three) times daily with meals. 10 mL 11   . [DISCONTINUED] insulin glargine (LANTUS) 100 UNIT/ML injection Inject 0.34 mLs (34 Units total) into the skin daily. 10 mL 11   . [DISCONTINUED] lactulose (CHRONULAC) 10 GM/15ML solution Take 45 mLs (30 g total) by mouth daily. 240 mL 0   . [DISCONTINUED] levofloxacin (LEVAQUIN) 750 MG tablet Take 1 tablet (750 mg total) by mouth daily. 3 tablet 0   . [DISCONTINUED] traZODone (DESYREL) 50 MG tablet Take 75 mg by mouth at bedtime.    Past Week at Unknown time   Scheduled:  . feeding supplement (ENSURE ENLIVE)  237 mL Oral TID BM  . insulin aspart  0-5 Units Subcutaneous QHS  .  insulin aspart  0-9 Units Subcutaneous TID WC  . insulin aspart  6 Units Subcutaneous TID AC  . insulin glargine  15 Units Subcutaneous QHS  . lactulose  20 g Oral Daily  . nadolol  20 mg Oral Daily  . naphazoline-glycerin  1-2 drop Both Eyes QID  . nystatin   Topical BID  . pantoprazole (PROTONIX) IV  40 mg Intravenous Q12H  . rifaximin  550 mg Oral BID  . sodium chloride flush  3 mL Intravenous Q12H  . spironolactone  25 mg Oral Daily  . traZODone  100 mg Oral QHS   Infusions:  . furosemide (LASIX) infusion 8 mg/hr (08/29/17 0916)   PRN: haloperidol lactate, hydrALAZINE, LORazepam, [DISCONTINUED] ondansetron **OR** ondansetron (ZOFRAN) IV, traMADol Anti-infectives    Start     Dose/Rate Route Frequency Ordered Stop   08/21/17 2200  meropenem (MERREM) 1 g in sodium chloride 0.9 % 100 mL IVPB  Status:  Discontinued     1 g 200 mL/hr over 30 Minutes Intravenous Every 8 hours 08/21/17 1121 08/21/17 1427   08/21/17 2000  ciprofloxacin (CIPRO) tablet 500 mg     500 mg Oral 2 times daily 08/21/17 1427 08/23/17 0814   08/21/17 1500  rifaximin (XIFAXAN) tablet 550 mg     550 mg Oral 2 times daily 08/21/17 1347     08/21/17 1430  metroNIDAZOLE (FLAGYL) tablet 500 mg     500 mg Oral Every 8 hours 08/21/17 1427 08/23/17 0547   08/18/17 2200  levofloxacin (LEVAQUIN) IVPB 750 mg  Status:  Discontinued     750 mg 100 mL/hr over 90 Minutes Intravenous Every 48 hours 08/16/17 0243 08/16/17 0537   08/18/17 1000  meropenem (MERREM) 2 g in sodium chloride 0.9 % 100 mL IVPB  Status:  Discontinued     2 g 200 mL/hr over 30 Minutes Intravenous Every 12 hours 08/18/17 0837 08/21/17 1121   08/16/17 1500  aztreonam (AZACTAM) 1 g in dextrose 5 % 50 mL IVPB  Status:  Discontinued     1 g 100 mL/hr over 30 Minutes Intravenous Every 12 hours 08/16/17 0248 08/16/17 0537   08/16/17 1145  rifaximin (XIFAXAN) tablet 550 mg  Status:  Discontinued     550 mg Oral 2 times daily 08/16/17 1141 08/17/17 0844    08/16/17 0900  vancomycin (VANCOCIN) 1,500 mg in sodium chloride 0.9 % 500 mL IVPB  Status:  Discontinued     1,500 mg 250 mL/hr over 120 Minutes Intravenous Every 24 hours 08/16/17 0243 08/17/17 0844   08/16/17 0545  meropenem (  MERREM) 1 g in sodium chloride 0.9 % 100 mL IVPB  Status:  Discontinued     1 g 200 mL/hr over 30 Minutes Intravenous Every 12 hours 08/16/17 0539 08/18/17 0837   08/16/17 0545  metroNIDAZOLE (FLAGYL) IVPB 500 mg  Status:  Discontinued     500 mg 100 mL/hr over 60 Minutes Intravenous Every 8 hours 08/16/17 0539 08/17/17 0844   08/16/17 0415  rifaximin (XIFAXAN) tablet 200 mg     200 mg Per Tube 2 times daily 08/16/17 0410 08/16/17 0506   08/16/17 0230  levofloxacin (LEVAQUIN) IVPB 750 mg     750 mg 100 mL/hr over 90 Minutes Intravenous  Once 08/16/17 0228 08/16/17 0405   08/16/17 0230  aztreonam (AZACTAM) 2 g in dextrose 5 % 50 mL IVPB     2 g 100 mL/hr over 30 Minutes Intravenous  Once 08/16/17 0228 08/16/17 0318   08/16/17 0230  vancomycin (VANCOCIN) IVPB 1000 mg/200 mL premix     1,000 mg 200 mL/hr over 60 Minutes Intravenous  Once 08/16/17 0228 08/16/17 0407      Assessment: Pharmacy consulted for electrolyte management for 66 yo male admitted s/p cardiac arrest.   Goal of Therapy:  Electrolytes WNL through gentle adjustment   Plan:  No replacement warranted at this time. Will obtain follow up electrolytes on 10/1.   Pharmacy will continue to monitor and adjust per consult.   MLS Clinical Pharmacist 08/29/2017

## 2017-08-29 NOTE — Progress Notes (Signed)
Patient ID: Jesus [name], male   DOB: [birth year], 66 y.o.   MRN: [id number]  [facility] PROGRESS NOTE  [name] UXL:[id number] DOB: [birth year] DOA: 08/16/2017 PCP: System, Pcp Not In  HPI/Subjective: Patient states he feels good. His scrotum is still swollen. His legs are swollen but is able with them up off the bed. Mental status improved since I saw him last. Able to answer questions.  Objective: Vitals:   08/29/17 0459 08/29/17 0928  BP: (!) 101/57 (!) 106/52  Pulse: 86 83  Resp:  20  Temp: 97.9 F (36.6 C)   SpO2: 96% 97%    Filed Weights   08/16/17 0045 08/18/17 0247 08/22/17 0406  Weight: 123.4 kg (272 lb) 129.6 kg (285 lb 11.5 oz) (!) 137 kg (302 lb 0.5 oz)    ROS: Review of Systems  Constitutional: Negative for chills and fever.  Eyes: Negative for blurred vision.  Respiratory: Negative for cough and shortness of breath.   Cardiovascular: Negative for chest pain.  Gastrointestinal: Negative for abdominal pain, constipation, diarrhea, nausea and vomiting.  Genitourinary: Negative for dysuria.  Musculoskeletal: Negative for joint pain.  Neurological: Negative for dizziness and headaches.   Exam: Physical Exam  HENT:  Nose: No mucosal edema.  Mouth/Throat: No oropharyngeal exudate or posterior oropharyngeal edema.  Eyes: Pupils are equal, round, and reactive to light. Conjunctivae, EOM and lids are normal.  Neck: No JVD present. Carotid bruit is not present. No edema present. No thyroid mass and no thyromegaly present.  Cardiovascular: S1 normal and S2 normal.  Exam reveals no gallop.   No murmur heard. Pulses:      Dorsalis pedis pulses are 2+ on the right side, and 2+ on the left side.  Respiratory: No respiratory distress. He has no wheezes. He has no rhonchi. He has no rales.  GI: Soft. Bowel sounds are normal. There is no tenderness.  Musculoskeletal:       Right ankle: He exhibits swelling.       Left ankle: He exhibits swelling.   Lymphadenopathy:    He has no cervical adenopathy.  Neurological: He is alert. No cranial nerve deficit.  Able to straight leg raise without a problem.  Skin: Skin is warm. Nails show no clubbing.  Chronic lower extremity discoloration bilateral  Psychiatric: He has a normal mood and affect.      Data Reviewed: Basic Metabolic Panel:  Recent Labs Lab 08/25/17 0518  08/26/17 0443 08/26/17 1521 08/27/17 0459 08/28/17 0504 08/29/17 1119  NA 153*  < > 149* 143 141 140 137  K 2.9*  < > 3.4* 3.8 4.1 3.8 3.4*  CL 120*  < > 118* 114* 112* 109 104  CO2 25  < > GLUCOSE 195*  < > 150* 242* 265* 253* 220*  BUN 31*  < > 22* 22* 23* 24* 24*  CREATININE 1.06  < > 1.02 0.97 0.96 0.86 1.15  CALCIUM 8.5*  < > 8.4* 8.2* 8.2* 8.3* 8.0*  MG 1.7  --  1.7  --   --   --   --   < > = values in this interval not displayed. Liver Function Tests:  Recent Labs Lab 08/23/17 0602 08/25/17 0518 08/26/17 1521 08/28/17 0504  AST 80* 73* 95* 105*  ALT 248* 131* 105* 90*  ALKPHOS 97 97 104 128*  BILITOT 10.4* 11.5* 12.4* 12.3*  PROT 5.8* 5.7* 5.7* 6.0*  ALBUMIN 2.2* 2.4* 2.2* 2.3*  Recent Labs Lab 08/23/17 0602 08/26/17 0443  AMMONIA 36* 35   CBC:  Recent Labs Lab 08/24/17 0323 08/27/17 0459 08/28/17 0504  WBC 7.7 7.0 5.9  HGB 10.1* 10.1* 10.3*  HCT 30.9* 31.0* 31.2*  MCV 89.4 89.4 91.3  PLT 56* 59* 56*    CBG:  Recent Labs Lab 08/28/17 1159 08/28/17 1633 08/28/17 2116 08/29/17 0758 08/29/17 1148  GLUCAP 264* 268* 252* 170* 283*    Recent Results (from the past 240 hour(s))  Body fluid culture     Status: None   Collection Time: 08/25/17  9:04 AM  Result Value Ref Range Status   Specimen Description PERITONEAL  Final   Special Requests NONE  Final   Gram Stain   Final    RARE WBC PRESENT, PREDOMINANTLY MONONUCLEAR NO ORGANISMS SEEN    Culture   Final    NO GROWTH 3 DAYS Performed at [facility], [address], [place], [place]  [address]    Report Status 08/28/2017 FINAL  Final     Scheduled Meds: . feeding supplement (ENSURE ENLIVE)  237 mL Oral TID BM  . furosemide  40 mg Intravenous BID  . insulin aspart  0-5 Units Subcutaneous QHS  . insulin aspart  0-9 Units Subcutaneous TID WC  . insulin aspart  6 Units Subcutaneous TID AC  . insulin glargine  15 Units Subcutaneous QHS  . lactulose  20 g Oral Daily  . nadolol  20 mg Oral Daily  . naphazoline-glycerin  1-2 drop Both Eyes QID  . nystatin   Topical BID  . pantoprazole (PROTONIX) IV  40 mg Intravenous Q12H  . potassium chloride  40 mEq Oral BID  . rifaximin  550 mg Oral BID  . sodium chloride flush  3 mL Intravenous Q12H  . spironolactone  25 mg Oral Daily  . traZODone  100 mg Oral QHS    Assessment/Plan:  1. Anasarca secondary to liver failure. Patient on IV albumin. Hold off Lasix drip at this time since creatinine starting to creep up from 0.86 to 1.15 today. Put on Lasix twice a day IV. Continue Aldactone. 2. Cirrhosis of the liver, ascites, hepatic encephalopathy, jaundice, thrombocytopenia. Continue nadolol, lactulose and Xifaxan. On Lasix and spironolactone. Patient also on Protonix. 3. Elevated liver function tests likely secondary to code. Liver function tests much improved since previous. 4. Status post cardiac arrest likely secondary to respiratory or GI bleed. 5. Hemorrhagic shock versus septic shock. Patient finished antibiotic course while here. 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 Acute hypoxic respiratory failure. Patient was extubated 08/18/2017. Off oxygen at this point 7. Metabolic acidosis and lactic acidosis secondary to respiratory failure and shock 8. Severe anemia. Patient received 5 units of pack red blood cells during the hospital course. Hemoglobin currently 10.3. 9. 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 Patient improved. 10. Elevated troponin. Demand ischemia secondary to shock. 11. Hypernatremia and hypokalemia.  This has been corrected during the hospital course.  Code Status:     Code Status Orders        Start     Ordered   08/19/17 1335  Do not attempt resuscitation (DNR)  Continuous    Question Answer Comment  In the event of cardiac or respiratory ARREST Do not call a "code blue"   In the event of cardiac or respiratory ARREST Do not perform Intubation, CPR, defibrillation or ACLS   In the event of cardiac or respiratory ARREST Use medication by any route, position, wound  care, and other measures to relive pain and suffering. May use oxygen, suction and manual treatment of airway obstruction as needed for comfort.      08/19/17 1334    Code Status History    Date Active Date Inactive Code Status Order ID Comments User Context   08/16/2017  4:04 AM 08/19/2017  1:34 PM Full Code [id number]  Ihor Austin, MD Inpatient   05/28/2017  5:15 AM 06/04/2017  8:10 PM Full Code [id number]  Arnaldo Natal, MD Inpatient     Disposition Plan: ut to rehabilitation soon  Time spent: 28 minutes  Alford Highland  [employer]

## 2017-08-30 LAB — COMPREHENSIVE METABOLIC PANEL
ALK PHOS: 147 U/L — AB (ref 38–126)
ALT: 81 U/L — ABNORMAL HIGH (ref 17–63)
AST: 136 U/L — AB (ref 15–41)
Albumin: 1.9 g/dL — ABNORMAL LOW (ref 3.5–5.0)
Anion gap: 7 (ref 5–15)
BILIRUBIN TOTAL: 12.2 mg/dL — AB (ref 0.3–1.2)
BUN: 23 mg/dL — AB (ref 6–20)
CALCIUM: 7.8 mg/dL — AB (ref 8.9–10.3)
CO2: 24 mmol/L (ref 22–32)
Chloride: 106 mmol/L (ref 101–111)
Creatinine, Ser: 1.08 mg/dL (ref 0.61–1.24)
GFR calc Af Amer: >60 mL/min (ref >60)
Glucose, Bld: 291 mg/dL — ABNORMAL HIGH (ref 65–99)
Potassium: 4.4 mmol/L (ref 3.5–5.1)
Sodium: 137 mmol/L (ref 135–145)
TOTAL PROTEIN: 5.4 g/dL — AB (ref 6.5–8.1)

## 2017-08-30 LAB — GLUCOSE, CAPILLARY
GLUCOSE-CAPILLARY: 274 mg/dL — AB (ref 65–99)
GLUCOSE-CAPILLARY: 272 mg/dL — AB (ref 65–99)
Glucose-Capillary: 322 mg/dL — ABNORMAL HIGH (ref 65–99)
Glucose-Capillary: 295 mg/dL — ABNORMAL HIGH (ref 65–99)

## 2017-08-30 MED ORDER — SPIRONOLACTONE 25 MG PO TABS
50.0000 mg | ORAL_TABLET | Freq: Every day | ORAL | Status: DC
  Administered 2017-08-31 – 2017-09-02 (×3): 50 mg via ORAL
  Filled 2017-08-30 (×3): qty 2

## 2017-08-30 MED ORDER — FUROSEMIDE 10 MG/ML IJ SOLN
60.0000 mg | Freq: Two times a day (BID) | INTRAMUSCULAR | Status: DC
  Administered 2017-08-30 – 2017-09-01 (×6): 60 mg via INTRAVENOUS
  Filled 2017-08-30 (×7): qty 6

## 2017-08-30 NOTE — Plan of Care (Signed)
Problem: Safety: Goal: Ability to remain free from injury will improve Outcome: Progressing High fall risk with yellow arm band on and yellow socks on. Bed alarm is on.    

## 2017-08-30 NOTE — Progress Notes (Signed)
Patient ID: Jesus [name], male   DOB: 1951/05/30, 66 y.o.   MRN: [id number]  [facility] PROGRESS NOTE  MAKYLE ESLICK WUJ:[id number] DOB: 1951-05-16 DOA: 08/16/2017 PCP: System, Pcp Not In  HPI/Subjective: Patient still complains of scrotal swelling.  Feels okay. Offers no further complaints.  Objective: Vitals:   08/29/17 1953 08/30/17 0458  BP: (!) 111/57 (!) 111/58  Pulse: 85 84  Resp: (!) 22 (!) 23  Temp: 99.1 F (37.3 C) 99.1 F (37.3 C)  SpO2: 100% 96%    Filed Weights   08/16/17 0045 08/18/17 0247 08/22/17 0406  Weight: 123.4 kg (272 lb) 129.6 kg (285 lb 11.5 oz) (!) 137 kg (302 lb 0.5 oz)    ROS: Review of Systems  Constitutional: Negative for chills and fever.  Eyes: Negative for blurred vision.  Respiratory: Negative for cough and shortness of breath.   Cardiovascular: Negative for chest pain.  Gastrointestinal: Negative for abdominal pain, constipation, diarrhea, nausea and vomiting.  Genitourinary: Negative for dysuria.  Musculoskeletal: Negative for joint pain.  Neurological: Negative for dizziness and headaches.   Exam: Physical Exam  HENT:  Nose: No mucosal edema.  Mouth/Throat: No oropharyngeal exudate or posterior oropharyngeal edema.  Eyes: Pupils are equal, round, and reactive to light. EOM and lids are normal.  Jaundice  Neck: No JVD present. Carotid bruit is not present. No edema present. No thyroid mass and no thyromegaly present.  Cardiovascular: S1 normal and S2 normal.  Exam reveals no gallop.   Murmur heard.  Systolic murmur is present with a grade of 3/6  Pulses:      Dorsalis pedis pulses are 2+ on the right side, and 2+ on the left side.  Respiratory: No respiratory distress. He has no wheezes. He has no rhonchi. He has no rales.  GI: Soft. Bowel sounds are normal. There is no tenderness.  Musculoskeletal:       Right ankle: He exhibits swelling.       Left ankle: He exhibits swelling.  Lymphadenopathy:    He has no cervical  adenopathy.  Neurological: He is alert. No cranial nerve deficit.  Able to straight leg raise without a problem.  Skin: Skin is warm. Nails show no clubbing.  Chronic lower extremity discoloration bilateral. Slight yellowish discoloration skin   Psychiatric: He has a normal mood and affect.      Data Reviewed: Basic Metabolic Panel:  Recent Labs Lab 08/25/17 0518  08/26/17 0443 08/26/17 1521 08/27/17 0459 08/28/17 0504 08/29/17 1119 08/30/17 0551  NA 153*  < > 149* 143 141 140 137 137  K 2.9*  < > 3.4* 3.8 4.1 3.8 3.4* 4.4  CL 120*  < > 118* 114* 112* 109 104 106  CO2 25  < > GLUCOSE 195*  < > 150* 242* 265* 253* 220* 291*  BUN 31*  < > 22* 22* 23* 24* 24* 23*  CREATININE 1.06  < > 1.02 0.97 0.96 0.86 1.15 1.08  CALCIUM 8.5*  < > 8.4* 8.2* 8.2* 8.3* 8.0* 7.8*  MG 1.7  --  1.7  --   --   --   --   --   < > = values in this interval not displayed. Liver Function Tests:  Recent Labs Lab 08/25/17 0518 08/26/17 1521 08/28/17 0504 08/30/17 0551  AST 73* 95* 105* 136*  ALT 131* 105* 90* 81*  ALKPHOS 97 104 128* 147*  BILITOT 11.5* 12.4* 12.3* 12.2*  PROT  5.7* 5.7* 6.0* 5.4*  ALBUMIN 2.4* 2.2* 2.3* 1.9*    Recent Labs Lab 08/26/17 0443  AMMONIA 35   CBC:  Recent Labs Lab 08/24/17 0323 08/27/17 0459 08/28/17 0504  WBC 7.7 7.0 5.9  HGB 10.1* 10.1* 10.3*  HCT 30.9* 31.0* 31.2*  MCV 89.4 89.4 91.3  PLT 56* 59* 56*    CBG:  Recent Labs Lab 08/29/17 1148 08/29/17 1651 08/29/17 2056 08/30/17 0804 08/30/17 1136  GLUCAP 283* 226* 222* 274* 322*    Recent Results (from the past 240 hour(s))  Body fluid culture     Status: None   Collection Time: 08/25/17  9:04 AM  Result Value Ref Range Status   Specimen Description PERITONEAL  Final   Special Requests NONE  Final   Gram Stain   Final    RARE WBC PRESENT, PREDOMINANTLY MONONUCLEAR NO ORGANISMS SEEN    Culture   Final    NO GROWTH 3 DAYS Performed at [facility],  [address], [place], [place] [address]    Report Status 08/28/2017 FINAL  Final     Scheduled Meds: . feeding supplement (ENSURE ENLIVE)  237 mL Oral TID BM  . furosemide  60 mg Intravenous BID  . insulin aspart  0-5 Units Subcutaneous QHS  . insulin aspart  0-9 Units Subcutaneous TID WC  . insulin aspart  6 Units Subcutaneous TID AC  . insulin glargine  15 Units Subcutaneous QHS  . lactulose  20 g Oral Daily  . nadolol  20 mg Oral Daily  . naphazoline-glycerin  1-2 drop Both Eyes QID  . nystatin   Topical BID  . pantoprazole (PROTONIX) IV  40 mg Intravenous Q12H  . potassium chloride  40 mEq Oral BID  . rifaximin  550 mg Oral BID  . sodium chloride flush  3 mL Intravenous Q12H  . [START ON 08/31/2017] spironolactone  50 mg Oral Daily  . traZODone  100 mg Oral QHS    Assessment/Plan:  1. Anasarca secondary to liver failure. Patient on IV albumin. Since creatinine better today restart Lasix IV 60 mg IV twice a day. Increased dose of Aldactone. 2. Cirrhosis of the liver, ascites, hepatic encephalopathy, jaundice, thrombocytopenia. Continue nadolol, lactulose and Xifaxan. On Lasix and spironolactone. Patient also on Protonix. 3. Elevated liver function tests likely secondary to code. Liver function tests much improved since previous except for the bilirubin which is up at 12.1 4. Status post cardiac arrest likely secondary to respiratory or GI bleed. 5. Hemorrhagic shock versus septic shock. Patient finished antibiotic course while here. 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 Acute hypoxic respiratory failure. Patient was extubated 08/18/2017. Off oxygen at this point 7. Metabolic acidosis and lactic acidosis secondary to respiratory failure and shock 8. Severe anemia. Patient received 5 units of pack red blood cells during the hospital course. Hemoglobin currently 10.3. 9. 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 Patient improved. 10. Elevated troponin. Demand ischemia  secondary to shock. 11. Hypernatremia and hypokalemia. This has been corrected during the hospital course.  Code Status:     Code Status Orders        Start     Ordered   08/19/17 1335  Do not attempt resuscitation (DNR)  Continuous    Question Answer Comment  In the event of cardiac or respiratory ARREST Do not call a "code blue"   In the event of cardiac or respiratory ARREST Do not perform Intubation, CPR, defibrillation or ACLS   In the event  of cardiac or respiratory ARREST Use medication by any route, position, wound care, and other measures to relive pain and suffering. May use oxygen, suction and manual treatment of airway obstruction as needed for comfort.      08/19/17 1334    Code Status History    Date Active Date Inactive Code Status Order ID Comments User Context   08/16/2017  4:04 AM 08/19/2017  1:34 PM Full Code [id number]  Ihor Austin, MD Inpatient   05/28/2017  5:15 AM 06/04/2017  8:10 PM Full Code [id number]  Arnaldo Natal, MD Inpatient     Disposition Plan:  to rehabilitation soon  Time spent: 26 minutes  Alford Highland  [facility]

## 2017-08-30 NOTE — Progress Notes (Signed)
MEDICATION RELATED CONSULT NOTE - follow  Pharmacy Consult for electrolyte management Indication: hypokalemia  Allergies  Allergen Reactions  . Penicillins Hives and Other (See Comments)    Has patient had a PCN reaction causing immediate rash, facial/tongue/throat swelling, SOB or lightheadedness with hypotension: Yes Has patient had a PCN reaction causing severe rash involving mucus membranes or skin necrosis: No Has patient had a PCN reaction that required hospitalization: No Has patient had a PCN reaction occurring within the last 10 years: No If all of the above answers are "NO", then may proceed with Cephalosporin use.     Patient Measurements: Height:  (165.1 cm) Weight: (!) 302 lb 0.5 oz (137 kg) IBW/kg (Calculated) : 61.5  Vital Signs: Temp: 98.7 F (37.1 C) (09/30 1347) Temp Source: Oral (09/30 1347) BP: 113/55 (09/30 1347) Pulse Rate: 73 (09/30 1347) Intake/Output from previous day: 09/29 0701 - 09/30 0700 In: 3509 [P.O.:3474; I.V.:35] Out: 2550 [Urine:2550] Intake/Output from this shift: Total I/O In: 240 [P.O.:240] Out: 1200 [Urine:1200]  Labs:  Recent Labs  08/28/17 0504 08/29/17 1119 08/30/17 0551  WBC 5.9  --   --   HGB 10.3*  --   --   HCT 31.2*  --   --   PLT 56*  --   --   CREATININE 0.86 1.15 1.08  ALBUMIN 2.3*  --  1.9*  PROT 6.0*  --  5.4*  AST 105*  --  136*  ALT 90*  --  81*  ALKPHOS 128*  --  147*  BILITOT 12.3*  --  12.2*   Estimated Creatinine Clearance: 87.3 mL/min (by C-G formula based on SCr of 1.08 mg/dL).   Medical History: Past Medical History:  Diagnosis Date  . Cirrhosis of liver (HCC)   . Diabetes (HCC)     Medications:  Prescriptions Prior to Admission  Medication Sig Dispense Refill Last Dose  . aspirin 81 MG EC tablet Take 81 mg by mouth daily. Swallow whole.     . baclofen (LIORESAL) 10 MG tablet Take 10 mg by mouth 2 (two) times daily as needed for muscle spasms.     Marland Kitchen escitalopram (LEXAPRO) 10 MG  tablet Take 20 mg by mouth daily.     Marland Kitchen glipiZIDE (GLUCOTROL) 5 MG tablet Take by mouth daily before breakfast.     . HYDROcodone-acetaminophen (NORCO/VICODIN) 5-325 MG tablet Take 1 tablet by mouth 2 (two) times daily as needed for moderate pain.     . hydrOXYzine (ATARAX/VISTARIL) 25 MG tablet Take 25 mg by mouth at bedtime as needed.     Marland Kitchen oxybutynin (DITROPAN) 5 MG tablet Take 5 mg by mouth 2 (two) times daily as needed for bladder spasms.     Marland Kitchen spironolactone (ALDACTONE) 25 MG tablet Take 25 mg by mouth daily.     . tamsulosin (FLOMAX) 0.4 MG CAPS capsule Take 0.4 mg by mouth daily.     . traZODone (DESYREL) 100 MG tablet Take 150 mg by mouth at bedtime.     . furosemide (LASIX) 40 MG tablet Take 40 mg by mouth 2 (two) times daily.    Past Week at Unknown time  . metFORMIN (GLUCOPHAGE) 1000 MG tablet Take 1,000 mg by mouth 2 (two) times daily with a meal.   Past Week at unknown  . pantoprazole (PROTONIX) 40 MG tablet Take 1 tablet (40 mg total) by mouth daily. 30 tablet 0   . primidone (MYSOLINE) 50 MG tablet Take 100 mg by mouth at bedtime.     . [  DISCONTINUED] glyBURIDE (DIABETA) 5 MG tablet Take 5 mg by mouth daily.   Past Week at Unknown time  . [DISCONTINUED] insulin aspart (NOVOLOG) 100 UNIT/ML injection Inject 7 Units into the skin 3 (three) times daily with meals. 10 mL 11   . [DISCONTINUED] insulin glargine (LANTUS) 100 UNIT/ML injection Inject 0.34 mLs (34 Units total) into the skin daily. 10 mL 11   . [DISCONTINUED] lactulose (CHRONULAC) 10 GM/15ML solution Take 45 mLs (30 g total) by mouth daily. 240 mL 0   . [DISCONTINUED] levofloxacin (LEVAQUIN) 750 MG tablet Take 1 tablet (750 mg total) by mouth daily. 3 tablet 0   . [DISCONTINUED] traZODone (DESYREL) 50 MG tablet Take 75 mg by mouth at bedtime.    Past Week at Unknown time   Scheduled:  . feeding supplement (ENSURE ENLIVE)  237 mL Oral TID BM  . furosemide  60 mg Intravenous BID  . insulin aspart  0-5 Units Subcutaneous QHS   . insulin aspart  0-9 Units Subcutaneous TID WC  . insulin aspart  6 Units Subcutaneous TID AC  . insulin glargine  15 Units Subcutaneous QHS  . lactulose  20 g Oral Daily  . nadolol  20 mg Oral Daily  . naphazoline-glycerin  1-2 drop Both Eyes QID  . nystatin   Topical BID  . pantoprazole (PROTONIX) IV  40 mg Intravenous Q12H  . potassium chloride  40 mEq Oral BID  . rifaximin  550 mg Oral BID  . sodium chloride flush  3 mL Intravenous Q12H  . [START ON 08/31/2017] spironolactone  50 mg Oral Daily  . traZODone  100 mg Oral QHS   Infusions:   PRN: haloperidol lactate, hydrALAZINE, LORazepam, [DISCONTINUED] ondansetron **OR** ondansetron (ZOFRAN) IV, traMADol Anti-infectives    Start     Dose/Rate Route Frequency Ordered Stop   08/21/17 2200  meropenem (MERREM) 1 g in sodium chloride 0.9 % 100 mL IVPB  Status:  Discontinued     1 g 200 mL/hr over 30 Minutes Intravenous Every 8 hours 08/21/17 1121 08/21/17 1427   08/21/17 2000  ciprofloxacin (CIPRO) tablet 500 mg     500 mg Oral 2 times daily 08/21/17 1427 08/23/17 0814   08/21/17 1500  rifaximin (XIFAXAN) tablet 550 mg     550 mg Oral 2 times daily 08/21/17 1347     08/21/17 1430  metroNIDAZOLE (FLAGYL) tablet 500 mg     500 mg Oral Every 8 hours 08/21/17 1427 08/23/17 0547   08/18/17 2200  levofloxacin (LEVAQUIN) IVPB 750 mg  Status:  Discontinued     750 mg 100 mL/hr over 90 Minutes Intravenous Every 48 hours 08/16/17 0243 08/16/17 0537   08/18/17 1000  meropenem (MERREM) 2 g in sodium chloride 0.9 % 100 mL IVPB  Status:  Discontinued     2 g 200 mL/hr over 30 Minutes Intravenous Every 12 hours 08/18/17 0837 08/21/17 1121   08/16/17 1500  aztreonam (AZACTAM) 1 g in dextrose 5 % 50 mL IVPB  Status:  Discontinued     1 g 100 mL/hr over 30 Minutes Intravenous Every 12 hours 08/16/17 0248 08/16/17 0537   08/16/17 1145  rifaximin (XIFAXAN) tablet 550 mg  Status:  Discontinued     550 mg Oral 2 times daily 08/16/17 1141 08/17/17  0844   08/16/17 0900  vancomycin (VANCOCIN) 1,500 mg in sodium chloride 0.9 % 500 mL IVPB  Status:  Discontinued     1,500 mg 250 mL/hr over 120 Minutes Intravenous Every  24 hours 08/16/17 0243 08/17/17 0844   08/16/17 0545  meropenem (MERREM) 1 g in sodium chloride 0.9 % 100 mL IVPB  Status:  Discontinued     1 g 200 mL/hr over 30 Minutes Intravenous Every 12 hours 08/16/17 0539 08/18/17 0837   08/16/17 0545  metroNIDAZOLE (FLAGYL) IVPB 500 mg  Status:  Discontinued     500 mg 100 mL/hr over 60 Minutes Intravenous Every 8 hours 08/16/17 0539 08/17/17 0844   08/16/17 0415  rifaximin (XIFAXAN) tablet 200 mg     200 mg Per Tube 2 times daily 08/16/17 0410 08/16/17 0506   08/16/17 0230  levofloxacin (LEVAQUIN) IVPB 750 mg     750 mg 100 mL/hr over 90 Minutes Intravenous  Once 08/16/17 0228 08/16/17 0405   08/16/17 0230  aztreonam (AZACTAM) 2 g in dextrose 5 % 50 mL IVPB     2 g 100 mL/hr over 30 Minutes Intravenous  Once 08/16/17 0228 08/16/17 0318   08/16/17 0230  vancomycin (VANCOCIN) IVPB 1000 mg/200 mL premix     1,000 mg 200 mL/hr over 60 Minutes Intravenous  Once 08/16/17 0228 08/16/17 0407      Assessment: Pharmacy consulted for electrolyte management for 66 yo male admitted s/p cardiac arrest.   Goal of Therapy:  Electrolytes WNL through gentle adjustment   Plan:  No replacement warranted at this time. Will obtain follow up electrolytes on 10/1.   Pharmacy will continue to monitor and adjust per consult.   MLS Clinical Pharmacist 08/30/2017

## 2017-08-31 LAB — BASIC METABOLIC PANEL
ANION GAP: 5 (ref 5–15)
BUN: 23 mg/dL — ABNORMAL HIGH (ref 6–20)
CALCIUM: 7.8 mg/dL — AB (ref 8.9–10.3)
CO2: 27 mmol/L (ref 22–32)
Chloride: 104 mmol/L (ref 101–111)
Creatinine, Ser: 1.13 mg/dL (ref 0.61–1.24)
GFR calc Af Amer: >60 mL/min (ref >60)
GFR calc non Af Amer: >60 mL/min (ref >60)
GLUCOSE: 231 mg/dL — AB (ref 65–99)
Potassium: 4.1 mmol/L (ref 3.5–5.1)
Sodium: 136 mmol/L (ref 135–145)

## 2017-08-31 LAB — GLUCOSE, CAPILLARY
GLUCOSE-CAPILLARY: 193 mg/dL — AB (ref 65–99)
GLUCOSE-CAPILLARY: 254 mg/dL — AB (ref 65–99)
GLUCOSE-CAPILLARY: 267 mg/dL — AB (ref 65–99)
GLUCOSE-CAPILLARY: 284 mg/dL — AB (ref 65–99)

## 2017-08-31 MED ORDER — GERHARDT'S BUTT CREAM
TOPICAL_CREAM | Freq: Two times a day (BID) | CUTANEOUS | Status: DC
  Administered 2017-08-31 – 2017-09-02 (×5): via TOPICAL
  Filled 2017-08-31 (×2): qty 1

## 2017-08-31 MED ORDER — LACTULOSE 10 GM/15ML PO SOLN
10.0000 g | Freq: Every day | ORAL | Status: DC
  Administered 2017-09-01 – 2017-09-02 (×2): 10 g via ORAL
  Filled 2017-08-31 (×2): qty 30

## 2017-08-31 MED ORDER — ENSURE ENLIVE PO LIQD
237.0000 mL | Freq: Four times a day (QID) | ORAL | Status: DC
  Administered 2017-08-31 – 2017-09-02 (×7): 237 mL via ORAL

## 2017-08-31 MED ORDER — INSULIN GLARGINE 100 UNIT/ML ~~LOC~~ SOLN
20.0000 [IU] | Freq: Every day | SUBCUTANEOUS | Status: DC
  Administered 2017-08-31 – 2017-09-01 (×2): 20 [IU] via SUBCUTANEOUS
  Filled 2017-08-31 (×3): qty 0.2

## 2017-08-31 MED ORDER — PANTOPRAZOLE SODIUM 40 MG PO TBEC
40.0000 mg | DELAYED_RELEASE_TABLET | Freq: Two times a day (BID) | ORAL | Status: DC
  Administered 2017-08-31 – 2017-09-02 (×4): 40 mg via ORAL
  Filled 2017-08-31 (×4): qty 1

## 2017-08-31 MED ORDER — POTASSIUM CHLORIDE CRYS ER 20 MEQ PO TBCR
20.0000 meq | EXTENDED_RELEASE_TABLET | Freq: Two times a day (BID) | ORAL | Status: DC
  Administered 2017-08-31 – 2017-09-02 (×4): 20 meq via ORAL
  Filled 2017-08-31 (×4): qty 1

## 2017-08-31 MED ORDER — FUROSEMIDE 10 MG/ML IJ SOLN
INTRAMUSCULAR | Status: AC
Start: 2017-08-31 — End: 2017-08-31
  Administered 2017-08-31: 09:00:00 60 mg via INTRAVENOUS
  Filled 2017-08-31: qty 4

## 2017-08-31 NOTE — Progress Notes (Signed)
Nutrition Follow-up  DOCUMENTATION CODES:   Morbid obesity  INTERVENTION:  Recommend liberalizing diet back to Regular. Patient eating a very small portion of meals.  Will discontinue Magic Cup.  Increase to Ensure Enlive po QID, each supplement provides 350 kcal and 20 grams of protein.  Made note on [employer] to assist patient in ordering 2-3 servings of protein per meal (soft meat, eggs, cheese, milk, yogurt). Also discussed with patient and daughter.  Recommend monitoring weights daily in setting of anasarca.  NUTRITION DIAGNOSIS:   Inadequate oral intake related to lethargy/confusion as evidenced by per patient/family report.  Ongoing inadequate intake, even though confusion has improved.  GOAL:   Patient will meet greater than or equal to 90% of their needs  Not met.  MONITOR:   PO intake, Supplement acceptance, Diet advancement, Labs, Weight trends, Skin, I & O's  REASON FOR ASSESSMENT:   Ventilator    ASSESSMENT:   66 year old male with PMHx of DM type 2, cirrhosis who presented with AMS after being found unresponsive, and also had hematemesis. Suffered PEA arrest in ED and underwent 40 minutes ACLS and was emergently intubated 9/16.  -Rectal tube was removed 9/26. -Diet was advanced to regular with thin liquids on 9/26. Now placed on carbohydrate modified diet today. -Awaiting insurance authorization. Has bed at [facility].  Met with patient and daughter at bedside. Patient appeared very jaundiced today. He reports his appetite is still not good. He is finishing a small portion of meals. His daughter has been ordering him a lot of soups, but he reports he is only having the broth out of the soup and is not eating the meat, noodles, or vegetables. He does not like the [employer], so he is not eating those anymore. Although his diet has been liberalized, it is still requiring a lot of energy to chew touch meats and breads, so  recommendation from SLP is to choose softer items at this time. Patient reports he is drinking 3 Ensure per day.  Meal Completion: 10-25% mainly; 100% of breakfast this morning (was not large breakfast) In the past 24 hours patient has had approximately 1679 kcal (74% minimum estimated needs) and 84 grams of protein (65% minimum estimated needs.  Medications reviewed and include: Lasix 60 mg BID, Novolog 0-9 units TID with meals, Novolog 0-5 units QHS, Novolog 6 units TID, Novolog 20 units QHS, lactulose, pantoprazole, potassium chloride 20 mEq BID.  Labs reviewed: CBG 193-272 past 24 hrs, BUN 23.  No subsequent weights from last assessment to trend.   Diet Order:  Diet Carb Modified Fluid consistency: Thin; Room service appropriate? Yes  Skin:  Wound (see comment) (MSAD to abdomen, groin, buttocks, perineum)  Last BM:  08/31/2017 - medium type 7  Height:   Ht Readings from Last 1 Encounters:  08/22/17 5' 5"  (1.651 m)    Weight:   Wt Readings from Last 1 Encounters:  08/22/17 (!) 302 lb 0.5 oz (137 kg)    Ideal Body Weight:  70 kg (calculated with ht of 5' 8"  from previous admission)  BMI:  Body mass index is 50.26 kg/m.  Estimated Nutritional Needs:   Kcal:  2260-2465 (MSJ x 1.1-1.2)  Protein:  130-140 grams (0.9-1.1 grams/kg)  Fluid:  2.1 L/day (30 ml/kg IBW)  EDUCATION NEEDS:   No education needs identified at this time  Willey Blade, MS, RD, LDN Pager: [phone] After Hours Pager: [phone]

## 2017-08-31 NOTE — Consult Note (Signed)
WOC Nurse wound consult note Reason for Consult: moisture associated skin damage to perineal skin, buttocks and intertriginous dermatitis to subpannicular skin on abdomen.  Wound type:moisture associated skin damage (MASD) Pressure Injury POA: NA Measurement: generalized erythema and denuded skin Wound UJW:JXBJ and moist Drainage (amount, consistency, odor) scant serous weeping Periwound:blanchable erythema Dressing procedure/placement/frequency: Cleanse perineal skin with soap and water twice daily.  Apply Gerhardt's barrier cream.  This compounded mixture of zinc, hydrocortisone and nystatin will improve skin microclimate and promote healing with a water proof barrier.  InterDry Ag to abdominal pannus: Measure and cut length of InterDry Ag+ to fit in skin folds that have skin breakdown Tuck InterDry  Ag+ fabric into skin folds in a single layer, allow for 2 inches of overhang from skin edges to allow for wicking to occur May remove to bathe; dry area thoroughly and then tuck into affected areas again Do not apply any creams or ointments when using InterDry Ag+ DO NOT THROW AWAY FOR 5 DAYS unless soiled with stool DO NOT [facility] product, this will inactivate the silver in the material  New sheet of Interdry Ag+ should be applied after 5 days of use if patient continues to have skin breakdown    Will not follow at this time.  Please re-consult if needed.  Maple Hudson RN BSN CWON Pager [phone]

## 2017-08-31 NOTE — Clinical Social Work Note (Signed)
CSW faxed updated clinical information to patient's insurance company.  CSW awaiting for insurance authorization, patient has a bed available at [facility].  Patient can discharge to SNF once he is medically ready for discharge and orders have been received.  CSW continuing to follow patient's progress throughout discharge planning.  Jesus [name]. Jesus [name], MSW, Theresia Majors [phone]  08/31/2017 9:58 AM

## 2017-08-31 NOTE — Progress Notes (Signed)
MEDICATION RELATED CONSULT NOTE - follow up  Pharmacy Consult for electrolyte management Indication: hypokalemia  Allergies  Allergen Reactions  . Penicillins Hives and Other (See Comments)    Has patient had a PCN reaction causing immediate rash, facial/tongue/throat swelling, SOB or lightheadedness with hypotension: Yes Has patient had a PCN reaction causing severe rash involving mucus membranes or skin necrosis: No Has patient had a PCN reaction that required hospitalization: No Has patient had a PCN reaction occurring within the last 10 years: No If all of the above answers are "NO", then may proceed with Cephalosporin use.     Patient Measurements: Height:  (165.1 cm) Weight: (!) 302 lb 0.5 oz (137 kg) IBW/kg (Calculated) : 61.5  Vital Signs: Temp: 99 F (37.2 C) (10/01 0411) Temp Source: Oral (10/01 0411) BP: 106/60 (10/01 0411) Pulse Rate: 84 (10/01 0411) Intake/Output from previous day: 09/30 0701 - 10/01 0700 In: 1146 [P.O.:1140; I.V.:6] Out: 3625 [Urine:3625] Intake/Output from this shift: No intake/output data recorded.  Labs:  Recent Labs  08/29/17 1119 08/30/17 0551 08/31/17 0340  CREATININE 1.15 1.08 1.13  ALBUMIN  --  1.9*  --   PROT  --  5.4*  --   AST  --  136*  --   ALT  --  81*  --   ALKPHOS  --  147*  --   BILITOT  --  12.2*  --    Estimated Creatinine Clearance: 83.4 mL/min (by C-G formula based on SCr of 1.13 mg/dL).   Assessment: Pharmacy consulted for electrolyte management for 66 yo male admitted s/p cardiac arrest.   K 4.1, Ca 7.8, Corr Ca 9.5  Goal of Therapy:  Electrolytes WNL through gentle adjustment   Plan:  No replacement warranted at this time. Will obtain follow up electrolytes in AM.  Pharmacy will continue to monitor and adjust per consult.   Crist Fat, PharmD, BCPS Clinical Pharmacist 08/31/2017 10:10 AM

## 2017-08-31 NOTE — Progress Notes (Signed)
Followed up with Pt. Regarding MD recommendations, limiting fluid, move as much as possible. Furthermore spoke with pt. About skin care and encouraged pt. To call  For assistance prior to defecation of bowels. Pt concerned that bedpan will cause pressure that hurts. Pt. Needs reinforcement regularly to move in what ever capacity he is willing.

## 2017-08-31 NOTE — Progress Notes (Signed)
Patient ID: Jesus [name], male   DOB: [birth year], 66 y.o.   MRN: [id number]   [facility] PROGRESS NOTE  Jesus [name] NWG:[id number] DOB: [birth year] DOA: 08/16/2017 PCP: System, Pcp Not In  HPI/Subjective: Patient still complains of scrotal swelling.  Answers questions. Feels okay.  Objective: Vitals:   08/30/17 1950 08/31/17 0411  BP: (!) 108/56 106/60  Pulse: 81 84  Resp: 20 (!) 24  Temp: 98.6 F (37 C) 99 F (37.2 C)  SpO2: 98% 96%    Filed Weights   08/16/17 0045 08/18/17 0247 08/22/17 0406  Weight: 123.4 kg (272 lb) 129.6 kg (285 lb 11.5 oz) (!) 137 kg (302 lb 0.5 oz)    ROS: Review of Systems  Constitutional: Negative for chills and fever.  Eyes: Negative for blurred vision.  Respiratory: Negative for cough and shortness of breath.   Cardiovascular: Negative for chest pain.  Gastrointestinal: Positive for diarrhea. Negative for abdominal pain, constipation, nausea and vomiting.  Genitourinary: Negative for dysuria.  Musculoskeletal: Negative for joint pain.  Neurological: Negative for dizziness and headaches.   Exam: Physical Exam  HENT:  Nose: No mucosal edema.  Mouth/Throat: No oropharyngeal exudate or posterior oropharyngeal edema.  Eyes: Pupils are equal, round, and reactive to light. EOM and lids are normal.  Jaundice  Neck: No JVD present. Carotid bruit is not present. No edema present. No thyroid mass and no thyromegaly present.  Cardiovascular: S1 normal and S2 normal.  Exam reveals no gallop.   Murmur heard.  Systolic murmur is present with a grade of 3/6  Pulses:      Dorsalis pedis pulses are 2+ on the right side, and 2+ on the left side.  Respiratory: No respiratory distress. He has no wheezes. He has no rhonchi. He has no rales.  GI: Soft. Bowel sounds are normal. There is no tenderness.  Genitourinary:  Genitourinary Comments: Scrotal swelling bilaterally 4+  Musculoskeletal:       Right ankle: He exhibits swelling.       Left ankle: He  exhibits swelling.  Lymphadenopathy:    He has no cervical adenopathy.  Neurological: He is alert. No cranial nerve deficit.  Able to straight leg raise without a problem.  Skin: Skin is warm. Nails show no clubbing.  Chronic lower extremity discoloration bilateral. Slight yellowish discoloration skin   Psychiatric: He has a normal mood and affect.      Data Reviewed: Basic Metabolic Panel:  Recent Labs Lab 08/25/17 0518  08/26/17 0443  08/27/17 0459 08/28/17 0504 08/29/17 1119 08/30/17 0551 08/31/17 0340  NA 153*  < > 149*  < > 141 140 137 137 136  K 2.9*  < > 3.4*  < > 4.1 3.8 3.4* 4.4 4.1  CL 120*  < > 118*  < > 112* 109 104 106 104  CO2 25  < > 25  < > GLUCOSE 195*  < > 150*  < > 265* 253* 220* 291* 231*  BUN 31*  < > 22*  < > 23* 24* 24* 23* 23*  CREATININE 1.06  < > 1.02  < > 0.96 0.86 1.15 1.08 1.13  CALCIUM 8.5*  < > 8.4*  < > 8.2* 8.3* 8.0* 7.8* 7.8*  MG 1.7  --  1.7  --   --   --   --   --   --   < > = values in this interval not displayed. Liver Function Tests:  Recent  Labs Lab 08/25/17 0518 08/26/17 1521 08/28/17 0504 08/30/17 0551  AST 73* 95* 105* 136*  ALT 131* 105* 90* 81*  ALKPHOS 97 104 128* 147*  BILITOT 11.5* 12.4* 12.3* 12.2*  PROT 5.7* 5.7* 6.0* 5.4*  ALBUMIN 2.4* 2.2* 2.3* 1.9*    Recent Labs Lab 08/26/17 0443  AMMONIA 35   CBC:  Recent Labs Lab 08/27/17 0459 08/28/17 0504  WBC 7.0 5.9  HGB 10.1* 10.3*  HCT 31.0* 31.2*  MCV 89.4 91.3  PLT 59* 56*    CBG:  Recent Labs Lab 08/30/17 1136 08/30/17 1646 08/30/17 2045 08/31/17 0741 08/31/17 1205  GLUCAP 322* 295* 272* 193* 254*    Recent Results (from the past 240 hour(s))  Body fluid culture     Status: None   Collection Time: 08/25/17  9:04 AM  Result Value Ref Range Status   Specimen Description PERITONEAL  Final   Special Requests NONE  Final   Gram Stain   Final    RARE WBC PRESENT, PREDOMINANTLY MONONUCLEAR NO ORGANISMS SEEN    Culture    Final    NO GROWTH 3 DAYS Performed at [facility], [address] [place], [place] [address]    Report Status 08/28/2017 FINAL  Final     Scheduled Meds: . feeding supplement (ENSURE ENLIVE)  237 mL Oral TID BM  . furosemide  60 mg Intravenous BID  . Gerhardt's butt cream   Topical BID  . insulin aspart  0-5 Units Subcutaneous QHS  . insulin aspart  0-9 Units Subcutaneous TID WC  . insulin aspart  6 Units Subcutaneous TID AC  . insulin glargine  20 Units Subcutaneous QHS  . [START ON 09/01/2017] lactulose  10 g Oral Daily  . nadolol  20 mg Oral Daily  . naphazoline-glycerin  1-2 drop Both Eyes QID  . nystatin   Topical BID  . pantoprazole  40 mg Oral BID  . potassium chloride  20 mEq Oral BID  . rifaximin  550 mg Oral BID  . sodium chloride flush  3 mL Intravenous Q12H  . spironolactone  50 mg Oral Daily  . traZODone  100 mg Oral QHS    Assessment/Plan:  1. Anasarca secondary to liver failure, Severe scrotal swelling. Continue Lasix IV 60 mg IV twice a day. Increased dose of Aldactone to 50 mg daily. 2. Cirrhosis of the liver, ascites, hepatic encephalopathy, jaundice, thrombocytopenia, elevated liver function tests. Continue nadolol, lactulose and Xifaxan. On Lasix and spironolactone. Patient also on Protonix. 3. Elevated liver function tests likely secondary to code. Liver function tests much improved since previous except for the bilirubin which is up at 12.1. 4. Status post cardiac arrest likely secondary to respiratory or GI bleed. 5. Hemorrhagic shock versus septic shock. Patient finished antibiotic course while here. 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 6. Acute hypoxic respiratory failure. Patient was extubated 08/18/2017. Off oxygen at this point. 7. Metabolic acidosis and lactic acidosis secondary to respiratory failure and shock 8. Severe anemia. Patient received 5 units of pack red blood cells during the hospital course. Hemoglobin currently  10.3. 9. 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 Patient improved but still not back to his usual baseline 10. Elevated troponin. Demand ischemia secondary to shock. 11. Hypernatremia and hypokalemia. This has been corrected during the hospital course. 12. Type 2 diabetes mellitus increase Lantus to 20 units. Sliding scale.  Code Status:     Code Status Orders        Start  Ordered   08/19/17 1335  Do not attempt resuscitation (DNR)  Continuous    Question Answer Comment  In the event of cardiac or respiratory ARREST Do not call a "code blue"   In the event of cardiac or respiratory ARREST Do not perform Intubation, CPR, defibrillation or ACLS   In the event of cardiac or respiratory ARREST Use medication by any route, position, wound care, and other measures to relive pain and suffering. May use oxygen, suction and manual treatment of airway obstruction as needed for comfort.      08/19/17 1334    Code Status History    Date Active Date Inactive Code Status Order ID Comments User Context   08/16/2017  4:04 AM 08/19/2017  1:34 PM Full Code [id number]  Ihor Austin, MD Inpatient   05/28/2017  5:15 AM 06/04/2017  8:10 PM Full Code [id number]  Arnaldo Natal, MD Inpatient     Disposition Plan:  to [facility] and Wednesday  Time spent: 26 minutes, case discussed with daughter at the bedside. Case discussed with social work  Alford Highland  [employer]

## 2017-08-31 NOTE — Progress Notes (Signed)
Speech Therapy Note: received new order to discuss diet education w/ pt - NSG clarified that pt's diet needed to be changed to a "carb modified" diet but that he seemed to be tolerating the solids and thin liquids adequately. Met w/ pt and a Daughter who agreed and denied any overt s/s of aspiration w/ po's. Daughter did state that the pt does chew a somewhat lengthy time w/ the grilled cheese sandwiches and drinks the broth from the chicken noodle soups. Discussed this w/ pt and Daughter who both agreed to choose easy to eat foods he is interested in and to speak w/ the Dietician re: his diet needs. Suggested pt/Dtr to use soups to soften foods since he likes the broth/juice. Dietician arrived to talk w/ pt/Dtr.  It appeared per NSG's statement that the pt and Daughter needed to speak w/ the Dietician re: carb modified needs in diet. Pt and Daughter agreed w/ diet consistency and following general aspiration precautions as has been recommended including sitting more upright and forward in the bed to eat meals. NSG updated. ST services will sign off at this time.    Orinda Kenner, [place], CCC-SLP

## 2017-08-31 NOTE — Progress Notes (Signed)
Physical Therapy Treatment Patient Details Name: Jesus [name] MRN: [id number] DOB: 1951/08/10 Today's Date: 08/31/2017    History of Present Illness Pt admitted for cardiac arrest with complaints of AMS and hematemesis. PMH of cirrhosis, chronic iron deficiency, DM, hyperglycemia, cardiac arrest. Pt had two episodes of cardiac arrest in ED, went through 2 rounds of CPR, intubated from 9/16-9/18. Had severe GI bleed post EGD on 9/17, required multiple transfusions.    PT Comments    Pt declined bed mobility, transfers, and amb this session secondary to scrotal swelling and resulting pain with movement.  Pt education on HEP and physiological benefits of activity provided.  Pt willing to perform supine therex and tolerated well with frequent short therapeutic rest breaks.  Pt will benefit from PT services in a SNF setting upon discharge to safely address above deficits for decreased caregiver assistance and eventual return to PLOF.    Follow Up Recommendations  SNF     Equipment Recommendations  Rolling walker with 5" wheels;Other (comment) (TBD at next venue of care)    Recommendations for Other Services       Precautions / Restrictions Precautions Precautions: Fall Precaution Comments: Orthostatic BP's next session Restrictions Weight Bearing Restrictions: No    Mobility  Bed Mobility               General bed mobility comments: Pt declined bed mobility, transfers, and amb this session secondary to scrotal swelling and pain with movement  Transfers                 General transfer comment: Pt declined bed mobility, transfers, and amb this session secondary to scrotal swelling and pain with movement  Ambulation/Gait             General Gait Details: Pt declined bed mobility, transfers, and amb this session secondary to scrotal swelling and pain with movement   Stairs            Wheelchair Mobility    Modified Rankin (Stroke Patients Only)        Balance       Sitting balance - Comments: N/A       Standing balance comment: N/A                            Cognition Arousal/Alertness: Awake/alert Behavior During Therapy: WFL for tasks assessed/performed Overall Cognitive Status: Within Functional Limits for tasks assessed                                        Exercises Total Joint Exercises Ankle Circles/Pumps: Other (comment);Both;AROM (3 x 10 reps) Quad Sets: Strengthening;Other (comment);Both (3 x 10 reps) Gluteal Sets: Strengthening;Both;Other (comment) (3 x 10 reps) Short Arc Quad: Strengthening;Both;Other (comment) (2 x 10 reps with manual resistance) Heel Slides: AROM;Both Hip ABduction/ADduction: AROM;Both;Other (comment) (2 x 10 reps) Straight Leg Raises: AROM;Other (comment);Both (2 x 10 reps) Other Exercises Other Exercises: HEP education/review with BLE APs, GS, and QS x 10 each 1-2 hours Other Exercises: Pt education regarding physiological benefits of activity and importance of mobility and gait    General Comments        Pertinent Vitals/Pain Pain Assessment: No/denies pain    Home Living                      Prior Function  PT Goals (current goals can now be found in the care plan section)      Frequency    Min 2X/week      PT Plan Current plan remains appropriate    Co-evaluation              AM-PAC PT "6 Clicks" Daily Activity  Outcome Measure                   End of Session   Activity Tolerance: Patient tolerated treatment well Patient left: in bed;with bed alarm set;with call bell/phone within reach   PT Visit Diagnosis: Other abnormalities of gait and mobility (R26.89);Muscle weakness (generalized) (M62.81)     Time: 1610-9604 PT Time Calculation (min) (ACUTE ONLY): 24 min  Charges:  $Therapeutic Exercise: 23-37 mins                    G Codes:       DElly Modena PT, DPT 08/31/17, 3:49  PM

## 2017-08-31 NOTE — Progress Notes (Signed)
OT Cancellation Note  Patient Details Name: Jesus [name] MRN: [id number] DOB: Apr 23, 1951   Cancelled Treatment:    Reason Eval/Treat Not Completed: Fatigue/lethargy limiting ability to participate. Upon attempt, pt with family/friends in room, reports having recently finished PT session and fatigued. Requesting heated blankets and agreeable to OT coming back tomorrow am. Brought heated blankets for pt and will re-attempt OT treatment next morning.   Richrd Prime, MPH, MS, OTR/L ascom [phone] 08/31/17, 2:58 PM

## 2017-08-31 NOTE — Progress Notes (Signed)
Inpatient Diabetes Program Recommendations  AACE/ADA: New Consensus Statement on Inpatient Glycemic Control (2015)  Target Ranges:  Prepandial:   less than 140 mg/dL      Peak postprandial:   less than 180 mg/dL (1-2 hours)      Critically ill patients:  140 - 180 mg/dL   Lab Results  Component Value Date   GLUCAP 272 (H) 08/30/2017   HGBA1C 5.9 (H) 08/27/2017    Review of Glycemic ControlResults for TYJAY, GALINDO (MRN [id number]) as of 08/31/2017 06:27  Ref. Range 08/30/2017 08:04 08/30/2017 11:36 08/30/2017 16:46 08/30/2017 20:45  Glucose-Capillary Latest Ref Range: 65 - 99 mg/dL 308 (H) 657 (H) 846 (H) 272 (H)   Results for CHANTRY, HEADEN (MRN [id number]) as of 08/31/2017 06:27  Ref. Range 08/31/2017 03:40  Glucose Latest Ref Range: 65 - 99 mg/dL 324 (H)  Home DM Meds: Lantus 34 units daily Novolog 7 units TID Glyburide 5 mg daily Metformin 1000 mg BID  Current orders for Inpatient glycemic control: Lantus 15 units daily, Novolog sensitive tid with meals and HS, Novolog 6 units tid with meals Inpatient Diabetes Program Recommendations:  Please consider increasing Lantus to 20 units daily.    Thanks, Beryl Meager, RN, BC-ADM Inpatient Diabetes Coordinator Pager [phone] (8a-5p)

## 2017-08-31 NOTE — Plan of Care (Signed)
VSS, free of falls during shift.  Reported HA pain 6/10, received PRN PO Tramadol , asleep on reassessment.  No other complaints overnight.  Bed in low position, call bell within reach.  WCTM.

## 2017-08-31 NOTE — Care Management Important Message (Signed)
Important Message  Patient Details  Name: Jesus [name] MRN: [id number] Date of Birth: [birth year]   Medicare Important Message Given:  Yes    Gwenette Greet, RN 08/31/2017, 8:20 AM

## 2017-09-01 LAB — COMPREHENSIVE METABOLIC PANEL
ALBUMIN: 1.8 g/dL — AB (ref 3.5–5.0)
ALK PHOS: 161 U/L — AB (ref 38–126)
ALT: 97 U/L — AB (ref 17–63)
AST: 166 U/L — AB (ref 15–41)
Anion gap: 9 (ref 5–15)
BILIRUBIN TOTAL: 12.4 mg/dL — AB (ref 0.3–1.2)
BUN: 25 mg/dL — AB (ref 6–20)
CALCIUM: 7.9 mg/dL — AB (ref 8.9–10.3)
CO2: 27 mmol/L (ref 22–32)
CREATININE: 1.05 mg/dL (ref 0.61–1.24)
Chloride: 98 mmol/L — ABNORMAL LOW (ref 101–111)
GFR calc Af Amer: >60 mL/min (ref >60)
GFR calc non Af Amer: >60 mL/min (ref >60)
GLUCOSE: 316 mg/dL — AB (ref 65–99)
POTASSIUM: 4.6 mmol/L (ref 3.5–5.1)
Sodium: 134 mmol/L — ABNORMAL LOW (ref 135–145)
TOTAL PROTEIN: 5.6 g/dL — AB (ref 6.5–8.1)

## 2017-09-01 LAB — GLUCOSE, CAPILLARY
GLUCOSE-CAPILLARY: 265 mg/dL — AB (ref 65–99)
Glucose-Capillary: 259 mg/dL — ABNORMAL HIGH (ref 65–99)
Glucose-Capillary: 259 mg/dL — ABNORMAL HIGH (ref 65–99)
Glucose-Capillary: 279 mg/dL — ABNORMAL HIGH (ref 65–99)

## 2017-09-01 LAB — HEMOGLOBIN: Hemoglobin: 9.6 g/dL — ABNORMAL LOW (ref 13.0–18.0)

## 2017-09-01 MED ORDER — FERROUS SULFATE 325 (65 FE) MG PO TABS
325.0000 mg | ORAL_TABLET | Freq: Two times a day (BID) | ORAL | Status: DC
  Administered 2017-09-01 – 2017-09-02 (×2): 325 mg via ORAL
  Filled 2017-09-01 (×2): qty 1

## 2017-09-01 MED ORDER — NADOLOL 20 MG PO TABS
10.0000 mg | ORAL_TABLET | Freq: Every day | ORAL | Status: DC
  Administered 2017-09-02: 10 mg via ORAL
  Filled 2017-09-01: qty 1

## 2017-09-01 NOTE — Progress Notes (Signed)
Inpatient Diabetes Program Recommendations  AACE/ADA: New Consensus Statement on Inpatient Glycemic Control (2015)  Target Ranges:  Prepandial:   less than 140 mg/dL      Peak postprandial:   less than 180 mg/dL (1-2 hours)      Critically ill patients:  140 - 180 mg/dL   Lab Results  Component Value Date   GLUCAP 259 (H) 09/01/2017   HGBA1C 5.9 (H) 08/27/2017    Review of Glycemic ControlResults for DEVERICK, PRUSS (MRN [id number]) as of 09/01/2017 10:59  Ref. Range 08/31/2017 07:41 08/31/2017 12:05 08/31/2017 16:59 08/31/2017 21:35 09/01/2017 07:32  Glucose-Capillary Latest Ref Range: 65 - 99 mg/dL 829 (H) 562 (H) 130 (H) 284 (H) 259 (H)    Diabetes history: Type 2 diabetes Outpatient Diabetes medications: Lantus 34 units daily, Novolog 7 units tid with meals, Metformin 1000 mg bid Current orders for Inpatient glycemic control: Novolog sensitive tid with meals and HS, Novolog 6 units tid with meals, Lantus 20 units q HS  Inpatient Diabetes Program Recommendations:   Please consider increaing Lantus to 28 units q HS.    Thanks, Beryl Meager, RN, BC-ADM Inpatient Diabetes Coordinator Pager [phone] (8a-5p)

## 2017-09-01 NOTE — Clinical Social Work Note (Addendum)
CSW spoke with patient's insurance company, patient has been approved to go to [place] [facility] [place] [facility].  [facility] number is [id number] and it is good for discharge on Wednesday if patient is medically ready for discharge and orders have been received.  Ervin Knack. Jaquan Sadowsky, MSW, Theresia Majors [phone]  09/01/2017 3:16 PM

## 2017-09-01 NOTE — Progress Notes (Signed)
Physical Therapy Treatment Patient Details Name: Jesus [name] MRN: [id number] DOB: June 23, 1951 Today's Date: 09/01/2017    History of Present Illness Pt admitted for cardiac arrest with complaints of AMS and hematemesis. PMH of cirrhosis, chronic iron deficiency, DM, hyperglycemia, cardiac arrest. Pt had two episodes of cardiac arrest in ED, went through 2 rounds of CPR, intubated from 9/16-9/18. Had severe GI bleed post EGD on 9/17, required multiple transfusions.    PT Comments    Pt feeling good ready for session.  HEP review.  Pt incontinent of BM noted during ex.  CNA in to assist with care and to assist with standing attempts along with rehab tech.  Due to pt short stature and bed height from pressure mattress, brought in large step platform from rehab gym for standing trials. (Difficulty was noted last session getting in/out of bed due to bed height and pressure mattress).   Max a x 3 to edge of bed.  Sitting balance affected by soft air mattress and by continued scrotal edema.  Pt was able to stand x 2 with walker and mod a + 2 to stand and min a + 2-3 once standing.  Tolerated standing well but fatigued quickly.  He was able to step backwards towards bed once standing but gait limited as he was standing on step stool.  Discussed pt with MD and RN as they were both in during session.  Orthostatic BP's were monitored today during session with normal BP responses observed.  No orthostatic issues.  Weakness today attributed to general fatigue and activity tolerance.    Follow Up Recommendations  SNF     Equipment Recommendations  Rolling walker with 5" wheels;Other (comment)    Recommendations for Other Services       Precautions / Restrictions Precautions Precautions: Fall Restrictions Weight Bearing Restrictions: No    Mobility  Bed Mobility Overal bed mobility: Needs Assistance Bed Mobility: Rolling;Supine to Sit;Sit to Supine Rolling: Max assist   Supine to sit: Max  assist;+2 for physical assistance Sit to supine: Max assist;+2 for physical assistance   General bed mobility comments: +3 assist for safety with bed mobility and standing  Transfers Overall transfer level: Needs assistance Equipment used: Rolling walker (2 wheeled)                Ambulation/Gait Ambulation/Gait assistance: Mod assist;+2 physical assistance   Assistive device: Rolling walker (2 wheeled)       General Gait Details: standing with +2 min assist, 3rd person for safety.  no gait.   Stairs            Wheelchair Mobility    Modified Rankin (Stroke Patients Only)       Balance Overall balance assessment: Needs assistance Sitting-balance support: Feet supported Sitting balance-Leahy Scale: Fair Sitting balance - Comments: balance affected by air mattress and edema in scrotum/pain   Standing balance support: Bilateral upper extremity supported Standing balance-Leahy Scale: Fair Standing balance comment: wide BOS due to scrotum                            Cognition Arousal/Alertness: Awake/alert Behavior During Therapy: WFL for tasks assessed/performed Overall Cognitive Status: Within Functional Limits for tasks assessed                                        Exercises Other  Exercises Other Exercises: rolling left/right for incontinece care  Other Exercises: HEP Review with pt and family, discussed rehab expectations    General Comments        Pertinent Vitals/Pain Pain Assessment: 0-10 Pain Score: 5  Pain Location: scrotum Pain Descriptors / Indicators: Sore Pain Intervention(s): Limited activity within patient's tolerance    Home Living                      Prior Function            PT Goals (current goals can now be found in the care plan section)      Frequency    Min 2X/week      PT Plan Current plan remains appropriate    Co-evaluation              AM-PAC PT "6 Clicks"  Daily Activity  Outcome Measure  Difficulty turning over in bed (including adjusting bedclothes, sheets and blankets)?: Unable Difficulty moving from lying on back to sitting on the side of the bed? : Unable Difficulty sitting down on and standing up from a chair with arms (e.g., wheelchair, bedside commode, etc,.)?: Unable Help needed moving to and from a bed to chair (including a wheelchair)?: A Lot Help needed walking in hospital room?: A Lot Help needed climbing 3-5 steps with a railing? : Total 6 Click Score: 8    End of Session Equipment Utilized During Treatment: Gait belt Activity Tolerance: Patient tolerated treatment well Patient left: in bed;with bed alarm set;with call bell/phone within reach Nurse Communication: Mobility status;Other (comment)       Time: 0981-1914 PT Time Calculation (min) (ACUTE ONLY): 38 min  Charges:  $Therapeutic Exercise: 8-22 mins $Therapeutic Activity: 23-37 mins                    G Codes:      Danielle Dess, PTA 09/01/17, 11:55 AM

## 2017-09-01 NOTE — Progress Notes (Signed)
OT Cancellation Note  Patient Details Name: Jesus [name] MRN: [id number] DOB: 1951-08-21   Cancelled Treatment:    Reason Eval/Treat Not Completed: Patient declined, no reason specified. Upon attempt, pt visiting with person in the room. Requesting OT come back in about 1 hour to attempt to treat. Will re-attempt at later time this afternoon as schedule allows.  Richrd Prime, MPH, MS, OTR/L ascom [phone] 09/01/17, 2:15 PM

## 2017-09-01 NOTE — Progress Notes (Signed)
Patient ID: Jesus [name], male   DOB: Apr 21, 1951, 66 y.o.   MRN: [id number]    [employer] [facility] PROGRESS NOTE  Jesus [name]:[id number] DOB: 11-15-1951 DOA: 08/16/2017 PCP: System, Pcp Not In  HPI/Subjective: Patient states he can't move around very well secondary to his scrotal swelling.  Feels okay and offers no complaints.  Objective: Vitals:   09/01/17 0413 09/01/17 1002  BP: (!) 101/54 107/63  Pulse: 82 76  Resp: (!) 21 18  Temp: 98.5 F (36.9 C) 98.2 F (36.8 C)  SpO2: 97% 100%    Filed Weights   08/16/17 0045 08/18/17 0247 08/22/17 0406  Weight: 123.4 kg (272 lb) 129.6 kg (285 lb 11.5 oz) (!) 137 kg (302 lb 0.5 oz)    ROS: Review of Systems  Constitutional: Negative for chills and fever.  Eyes: Negative for blurred vision.  Respiratory: Negative for cough and shortness of breath.   Cardiovascular: Negative for chest pain.  Gastrointestinal: Positive for diarrhea. Negative for abdominal pain, constipation, nausea and vomiting.  Genitourinary: Negative for dysuria.  Musculoskeletal: Negative for joint pain.  Neurological: Negative for dizziness and headaches.   Exam: Physical Exam  HENT:  Nose: No mucosal edema.  Mouth/Throat: No oropharyngeal exudate or posterior oropharyngeal edema.  Eyes: Pupils are equal, round, and reactive to light. EOM and lids are normal.  Jaundice  Neck: No JVD present. Carotid bruit is not present. No edema present. No thyroid mass and no thyromegaly present.  Cardiovascular: S1 normal and S2 normal.  Exam reveals no gallop.   Murmur heard.  Systolic murmur is present with a grade of 3/6  Pulses:      Dorsalis pedis pulses are 2+ on the right side, and 2+ on the left side.  Respiratory: No respiratory distress. He has no wheezes. He has no rhonchi. He has no rales.  GI: Soft. Bowel sounds are normal. There is no tenderness.  Genitourinary:  Genitourinary Comments: Scrotal swelling bilaterally 4+  Musculoskeletal:       Right  ankle: He exhibits swelling.       Left ankle: He exhibits swelling.  Lymphadenopathy:    He has no cervical adenopathy.  Neurological: He is alert. No cranial nerve deficit.  Able to straight leg raise without a problem.  Skin: Skin is warm. Nails show no clubbing.  Chronic lower extremity discoloration bilateral. Slight yellowish discoloration skin   Psychiatric: He has a normal mood and affect.      Data Reviewed: Basic Metabolic Panel:  Recent Labs Lab 08/26/17 0443  08/28/17 0504 08/29/17 1119 08/30/17 0551 08/31/17 0340 09/01/17 0440  NA 149*  < > 140 137 137 136 134*  K 3.4*  < > 3.8 3.4* 4.4 4.1 4.6  CL 118*  < > 109 104 106 104 98*  CO2 25  < > GLUCOSE 150*  < > 253* 220* 291* 231* 316*  BUN 22*  < > 24* 24* 23* 23* 25*  CREATININE 1.02  < > 0.86 1.15 1.08 1.13 1.05  CALCIUM 8.4*  < > 8.3* 8.0* 7.8* 7.8* 7.9*  MG 1.7  --   --   --   --   --   --   < > = values in this interval not displayed. Liver Function Tests:  Recent Labs Lab 08/26/17 1521 08/28/17 0504 08/30/17 0551 09/01/17 0440  AST 95* 105* 136* 166*  ALT 105* 90* 81* 97*  ALKPHOS 104 128* 147* 161*  BILITOT 12.4* 12.3* 12.2* 12.4*  PROT 5.7* 6.0* 5.4* 5.6*  ALBUMIN 2.2* 2.3* 1.9* 1.8*    Recent Labs Lab 08/26/17 0443  AMMONIA 35   CBC:  Recent Labs Lab 08/27/17 0459 08/28/17 0504 09/01/17 0440  WBC 7.0 5.9  --   HGB 10.1* 10.3* 9.6*  HCT 31.0* 31.2*  --   MCV 89.4 91.3  --   PLT 59* 56*  --     CBG:  Recent Labs Lab 08/31/17 1205 08/31/17 1659 08/31/17 2135 09/01/17 0732 09/01/17 1145  GLUCAP 254* 267* 284* 259* 259*    Recent Results (from the past 240 hour(s))  Body fluid culture     Status: None   Collection Time: 08/25/17  9:04 AM  Result Value Ref Range Status   Specimen Description PERITONEAL  Final   Special Requests NONE  Final   Gram Stain   Final    RARE WBC PRESENT, PREDOMINANTLY MONONUCLEAR NO ORGANISMS SEEN    Culture   Final     NO GROWTH 3 DAYS Performed at [facility], [address] [place], [place] [address]    Report Status 08/28/2017 FINAL  Final     Scheduled Meds: . feeding supplement (ENSURE ENLIVE)  237 mL Oral QID  . furosemide  60 mg Intravenous BID  . Gerhardt's butt cream   Topical BID  . insulin aspart  0-5 Units Subcutaneous QHS  . insulin aspart  0-9 Units Subcutaneous TID WC  . insulin aspart  6 Units Subcutaneous TID AC  . insulin glargine  20 Units Subcutaneous QHS  . lactulose  10 g Oral Daily  . nadolol  20 mg Oral Daily  . naphazoline-glycerin  1-2 drop Both Eyes QID  . nystatin   Topical BID  . pantoprazole  40 mg Oral BID  . potassium chloride  20 mEq Oral BID  . rifaximin  550 mg Oral BID  . sodium chloride flush  3 mL Intravenous Q12H  . spironolactone  50 mg Oral Daily  . traZODone  100 mg Oral QHS    Assessment/Plan:  1. Anasarca secondary to liver failure, Severe scrotal swelling. Continue Lasix IV 60 mg IV twice a day. Increased dose of Aldactone to 50 mg daily.  Potentially out to rehabilitation soon. 2. Cirrhosis of the liver, ascites, hepatic encephalopathy, jaundice, thrombocytopenia, elevated liver function tests. Continue nadolol, lactulose and Xifaxan. On Lasix and spironolactone. Patient also on Protonix. 3. Elevated liver function tests likely secondary to code. Liver function tests much improved since previous except for the bilirubin which is up at 12.4. 4. Status post cardiac arrest likely secondary to respiratory or GI bleed. 5. Hemorrhagic shock versus septic shock. Patient finished antibiotic course while here. 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 6. Acute hypoxic respiratory failure. Patient was extubated 08/18/2017. Off oxygen at this point. 7. Metabolic acidosis and lactic acidosis secondary to respiratory failure and shock 8. Severe anemia. Patient received 5 units of pack red blood cells during the hospital course. Hemoglobin  currently 9.6.  Start iron. 9. 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 Patient improved but still not back to his usual baseline. 10. Elevated troponin. Demand ischemia secondary to shock. 11. Hypernatremia and hypokalemia. This has been corrected during the hospital course. 12. Type 2 diabetes mellitus increase Lantus to 20 units. Sliding scale.  Code Status:     Code Status Orders        Start     Ordered   08/19/17 1335  Do  not attempt resuscitation (DNR)  Continuous    Question Answer Comment  In the event of cardiac or respiratory ARREST Do not call a "code blue"   In the event of cardiac or respiratory ARREST Do not perform Intubation, CPR, defibrillation or ACLS   In the event of cardiac or respiratory ARREST Use medication by any route, position, wound care, and other measures to relive pain and suffering. May use oxygen, suction and manual treatment of airway obstruction as needed for comfort.      08/19/17 1334    Code Status History    Date Active Date Inactive Code Status Order ID Comments User Context   08/16/2017  4:04 AM 08/19/2017  1:34 PM Full Code [id number]  Ihor Austin, MD Inpatient   05/28/2017  5:15 AM 06/04/2017  8:10 PM Full Code [id number]  Arnaldo Natal, MD Inpatient     Disposition Plan:  to rehabilitation Potentially this week if authoriztion obtained from insurane company  Time spent: 25 minutes, case discussed with daughter at the bedside.  Alford Highland  [employer]

## 2017-09-01 NOTE — Progress Notes (Signed)
MEDICATION RELATED CONSULT NOTE - follow up  Pharmacy Consult for electrolyte management Indication: hypokalemia  Allergies  Allergen Reactions  . Penicillins Hives and Other (See Comments)    Has patient had a PCN reaction causing immediate rash, facial/tongue/throat swelling, SOB or lightheadedness with hypotension: Yes Has patient had a PCN reaction causing severe rash involving mucus membranes or skin necrosis: No Has patient had a PCN reaction that required hospitalization: No Has patient had a PCN reaction occurring within the last 10 years: No If all of the above answers are "NO", then may proceed with Cephalosporin use.     Patient Measurements: Height:  (165.1 cm) Weight: (!) 302 lb 0.5 oz (137 kg) IBW/kg (Calculated) : 61.5  Vital Signs: Temp: 98.5 F (36.9 C) (10/02 0413) Temp Source: Oral (10/02 0413) BP: 101/54 (10/02 0413) Pulse Rate: 82 (10/02 0413) Intake/Output from previous day: 10/01 0701 - 10/02 0700 In: 480 [P.O.:480] Out: 1700 [Urine:1700] Intake/Output from this shift: No intake/output data recorded.  Labs:  Recent Labs  08/30/17 0551 08/31/17 0340 09/01/17 0440  HGB  --   --  9.6*  CREATININE 1.08 1.13 1.05  ALBUMIN 1.9*  --  1.8*  PROT 5.4*  --  5.6*  AST 136*  --  166*  ALT 81*  --  97*  ALKPHOS 147*  --  161*  BILITOT 12.2*  --  12.4*   Estimated Creatinine Clearance: 89.8 mL/min (by C-G formula based on SCr of 1.05 mg/dL).   Assessment: Pharmacy consulted for electrolyte management for 66 yo male admitted s/p cardiac arrest.   Goal of Therapy:  Electrolytes WNL through gentle adjustment   Plan:  No supplementation warranted @ this time. Will recheck electrolytes in 2 days.   Pharmacy will continue to monitor and adjust per consult.   Demetrius Charity, PharmD  Clinical Pharmacist 09/01/2017 8:49 AM

## 2017-09-02 LAB — COMPREHENSIVE METABOLIC PANEL
ALBUMIN: 1.8 g/dL — AB (ref 3.5–5.0)
ALK PHOS: 159 U/L — AB (ref 38–126)
ALT: 104 U/L — ABNORMAL HIGH (ref 17–63)
ANION GAP: 8 (ref 5–15)
AST: 193 U/L — ABNORMAL HIGH (ref 15–41)
BUN: 27 mg/dL — ABNORMAL HIGH (ref 6–20)
CALCIUM: 8.1 mg/dL — AB (ref 8.9–10.3)
CHLORIDE: 97 mmol/L — AB (ref 101–111)
CO2: 29 mmol/L (ref 22–32)
Creatinine, Ser: 1.04 mg/dL (ref 0.61–1.24)
GFR calc Af Amer: >60 mL/min (ref >60)
GFR calc non Af Amer: >60 mL/min (ref >60)
GLUCOSE: 250 mg/dL — AB (ref 65–99)
Potassium: 4.7 mmol/L (ref 3.5–5.1)
SODIUM: 134 mmol/L — AB (ref 135–145)
Total Bilirubin: 12.7 mg/dL — ABNORMAL HIGH (ref 0.3–1.2)
Total Protein: 6 g/dL — ABNORMAL LOW (ref 6.5–8.1)

## 2017-09-02 LAB — GLUCOSE, CAPILLARY
Glucose-Capillary: 212 mg/dL — ABNORMAL HIGH (ref 65–99)
Glucose-Capillary: 231 mg/dL — ABNORMAL HIGH (ref 65–99)

## 2017-09-02 MED ORDER — PANTOPRAZOLE SODIUM 40 MG PO TBEC: 40.0000 mg | DELAYED_RELEASE_TABLET | Freq: Two times a day (BID) | ORAL | 0 refills | Status: AC

## 2017-09-02 MED ORDER — FUROSEMIDE 20 MG PO TABS: 60.0000 mg | ORAL_TABLET | Freq: Two times a day (BID) | ORAL | 0 refills | Status: AC

## 2017-09-02 MED ORDER — FUROSEMIDE 40 MG PO TABS
60.0000 mg | ORAL_TABLET | Freq: Two times a day (BID) | ORAL | Status: DC
  Administered 2017-09-02: 10:00:00 60 mg via ORAL
  Filled 2017-09-02 (×2): qty 1

## 2017-09-02 MED ORDER — RIFAXIMIN 550 MG PO TABS: 550.0000 mg | ORAL_TABLET | Freq: Two times a day (BID) | ORAL | 0 refills | Status: AC

## 2017-09-02 MED ORDER — GERHARDT'S BUTT CREAM: 1.0000 "application " | TOPICAL_CREAM | Freq: Two times a day (BID) | CUTANEOUS | 0 refills | Status: AC

## 2017-09-02 MED ORDER — NYSTATIN 100000 UNIT/GM EX POWD: Freq: Two times a day (BID) | CUTANEOUS | 0 refills | Status: AC

## 2017-09-02 MED ORDER — NAPHAZOLINE-GLYCERIN 0.012-0.2 % OP SOLN: 1.0000 [drp] | Freq: Four times a day (QID) | OPHTHALMIC | 0 refills | Status: AC

## 2017-09-02 MED ORDER — INSULIN ASPART 100 UNIT/ML ~~LOC~~ SOLN: 6.0000 [IU] | Freq: Three times a day (TID) | SUBCUTANEOUS | 0 refills | Status: AC

## 2017-09-02 MED ORDER — SPIRONOLACTONE 50 MG PO TABS
50.0000 mg | ORAL_TABLET | Freq: Every day | ORAL | 0 refills | Status: AC
Start: 2017-09-02 — End: ?

## 2017-09-02 MED ORDER — TRAZODONE HCL 100 MG PO TABS: 100.0000 mg | ORAL_TABLET | Freq: Every day | ORAL | 0 refills | Status: AC

## 2017-09-02 MED ORDER — INSULIN GLARGINE 100 UNIT/ML ~~LOC~~ SOLN: 25.0000 [IU] | Freq: Every day | SUBCUTANEOUS | 0 refills | Status: AC

## 2017-09-02 MED ORDER — NADOLOL 20 MG PO TABS: 10.0000 mg | ORAL_TABLET | Freq: Every day | ORAL | 0 refills | Status: AC

## 2017-09-02 MED ORDER — ENSURE ENLIVE PO LIQD: 237.0000 mL | Freq: Four times a day (QID) | ORAL | 0 refills | Status: AC

## 2017-09-02 MED ORDER — INSULIN GLARGINE 100 UNIT/ML ~~LOC~~ SOLN
25.0000 [IU] | Freq: Every day | SUBCUTANEOUS | Status: DC
  Filled 2017-09-02: qty 0.25

## 2017-09-02 MED ORDER — TRAMADOL HCL 50 MG PO TABS: 50.0000 mg | ORAL_TABLET | Freq: Four times a day (QID) | ORAL | 0 refills | Status: AC | PRN

## 2017-09-02 MED ORDER — LACTULOSE 10 GM/15ML PO SOLN: 10.0000 g | Freq: Every day | ORAL | 0 refills | Status: AC

## 2017-09-02 MED ORDER — FERROUS SULFATE 325 (65 FE) MG PO TABS: 325.0000 mg | ORAL_TABLET | Freq: Two times a day (BID) | ORAL | 0 refills | Status: AC

## 2017-09-02 MED ORDER — POTASSIUM CHLORIDE CRYS ER 20 MEQ PO TBCR: 20.0000 meq | EXTENDED_RELEASE_TABLET | Freq: Every day | ORAL | 0 refills | Status: AC

## 2017-09-02 NOTE — Progress Notes (Signed)
Called report to Julia Masi LPN at [facility]

## 2017-09-02 NOTE — Clinical Social Work Note (Signed)
Patient to be d/c'ed today to [facility] room E10.  Patient and family agreeable to plans will transport via ems RN to call report [phone].  Windell Moulding, MSW, Theresia Majors [phone]

## 2017-09-02 NOTE — Discharge Summary (Signed)
[facility] - [place] [facility]   PATIENT NAME: Jesus [name]    MR#:  [id number]  DATE OF BIRTH:  [birth year]  DATE OF ADMISSION:  08/16/2017 ADMITTING PHYSICIAN: Ihor Austin, MD  DATE OF DISCHARGE: 09/02/2017  PRIMARY CARE PHYSICIAN: [facility]   ADMISSION DIAGNOSIS:  [facility] - Altered mental status GI bleed  DISCHARGE DIAGNOSIS:  Active Problems:   Cardiac arrest (HCC)   Hemorrhagic shock (HCC)   AKI (acute kidney injury) (HCC)   Hepatic cirrhosis (HCC)   Liver cirrhosis secondary to NASH (HCC)   Hematemesis   Palliative care encounter   Goals of care, counseling/discussion   Acute blood loss anemia   Coagulopathy (HCC)   Acute encephalopathy   DNR (do not resuscitate)   Palliative care by specialist   SECONDARY DIAGNOSIS:   Past Medical History:  Diagnosis Date  . Cirrhosis of liver (HCC)   . Diabetes [facility])     HOSPITAL COURSE:   1.  Cardiac arrest. This was believed secondary to respiratory arrest and GI bleed.  2.  Hemorrhagic shock versus septic shock. Cultures remain negative but patient finished an antibiotic course.  The patient had an endoscopy that showed angiectasia's that were cauterized. The patient was on Protonix drip and octreotide drip. Octreotide finished and patient over to oral Protonix twice a day her 1 month and can cut back to daily. Patient started on iron recently so stools may looked black. Recommend checking hemoglobin weekly with BMP.  Last hemoglobin 9.6. 3. Cirrhosis of the liver, ascites, hepatic encephalopathy, jaundice and thrombocytopenia. The patient is on nadolol to prevent varices, lactulose and Xifaxan for hepatic encephalopathy, patient on Lasix and Aldactone for ascites. Patient also had a paracentesis while here in the hospital. Platelet count in the 50s Patient also received platelet transfusion with the GI bleed. 4. Anasarca secondary to liver failure, severe scrotal edema.  The patient was diuresed  with IV Lasix while here in the hospital.  Scrotal edema is still present.  Will change Lasix over to by mouth. Recommend checking BMP on a weekly basis. 5. Elevated liver function tests secondary to code. Liver function tests much improved since admission except the bilirubin which is rising up at 12.7. Case discussed with Dr. Tobi Bastos gastroenterology this will likely take weeks and weeks to improve. 6. Acute hypoxic respiratory failure. Patient was on the ventilator after the code and extubated on 08/18/2017. Currently patient off oxygen at this point. 7. Metabolic acidosis and lactic acidosis secondary to respiratory failure and shock This has improved. 8. Severe anemia with GI bleed patient received 5 units of pack red blood cells during the hospital course and platelets. Last hemoglobin 9.6. Started on iron. 9. Acute anoxic encephalopathy after code. Patient has improved but as per daughter not back to baseline. 10. Elevated troponin demand ischemia secondary to shock. Unable to give blood thinners secondary to GI bleed. Patient on nadolol to prevent varices and also cardioprotection 11. Hypernatremia and hypokalemia during the hospital course these electrolytes have improved. Check BMP on a weekly basis to make sure her potassium does not start creeping up because the patient is on  Spironolactone. 12. Type 2 diabetes mellitus. Increase Lantus to 25 unitsdaily at bedtime and 6 units short acting insulin prior to meals. Continue to check fingersticks before every meal C and daily at bedtime.Titrate Lantus as needed. 13. Acute kidney injury secondary to code.  Creatinine has improved to 1.04 upon discharge home. 14. Palliative care to  follow-up at rehabilitation 15. Physical therapy at rehabilitation 16 morbid obesity. Weight loss needed  DISCHARGE CONDITIONS:   fair  CONSULTS OBTAINED:  critical care specialist Cardiology Gastroenterology Palliative care nephrology  DRUG ALLERGIES:    Allergies  Allergen Reactions  . Penicillins Hives and Other (See Comments)    Has patient had a PCN reaction causing immediate rash, facial/tongue/throat swelling, SOB or lightheadedness with hypotension: Yes Has patient had a PCN reaction causing severe rash involving mucus membranes or skin necrosis: No Has patient had a PCN reaction that required hospitalization: No Has patient had a PCN reaction occurring within the last 10 years: No If all of the above answers are "NO", then may proceed with Cephalosporin use.     DISCHARGE MEDICATIONS:   Current Discharge Medication List    START taking these medications   Details  feeding supplement, ENSURE ENLIVE, (ENSURE ENLIVE) LIQD Take 237 mLs by mouth 4 (four) times daily. Qty: 120 Bottle, Refills: 0    ferrous sulfate 325 (65 FE) MG tablet Take 1 tablet (325 mg total) by mouth 2 (two) times daily with a meal. Qty: 60 tablet, Refills: 0    Hydrocortisone (GERHARDT'S BUTT CREAM) CREA Apply 1 application topically 2 (two) times daily. Qty: 60 each, Refills: 0    insulin aspart (NOVOLOG) 100 UNIT/ML injection Inject 6 Units into the skin 3 (three) times daily before meals. Qty: 10 mL, Refills: 0    insulin glargine (LANTUS) 100 UNIT/ML injection Inject 0.25 mLs (25 Units total) into the skin at bedtime. Qty: 10 mL, Refills: 0    lactulose (CHRONULAC) 10 GM/15ML solution Take 15 mLs (10 g total) by mouth daily. Qty: 240 mL, Refills: 0    nadolol (CORGARD) 20 MG tablet Take 0.5 tablets (10 mg total) by mouth daily. Qty: 15 tablet, Refills: 0    naphazoline-glycerin (CLEAR EYES) 0.012-0.2 % SOLN Place 1-2 drops into both eyes 4 (four) times daily. Qty: 30 mL, Refills: 0    nystatin (MYCOSTATIN/NYSTOP) powder Apply topically 2 (two) times daily. Qty: 60 g, Refills: 0    potassium chloride SA (K-DUR,KLOR-CON) 20 MEQ tablet Take 1 tablet (20 mEq total) by mouth daily. Qty: 30 tablet, Refills: 0    rifaximin (XIFAXAN) 550 MG  TABS tablet Take 1 tablet (550 mg total) by mouth 2 (two) times daily. Qty: 60 tablet, Refills: 0    traMADol (ULTRAM) 50 MG tablet Take 1 tablet (50 mg total) by mouth every 6 (six) hours as needed for moderate pain. Qty: 12 tablet, Refills: 0      CONTINUE these medications which have CHANGED   Details  furosemide (LASIX) 20 MG tablet Take 3 tablets (60 mg total) by mouth 2 (two) times daily. Qty: 180 tablet, Refills: 0    pantoprazole (PROTONIX) 40 MG tablet Take 1 tablet (40 mg total) by mouth 2 (two) times daily. Qty: 30 tablet, Refills: 0    spironolactone (ALDACTONE) 50 MG tablet Take 1 tablet (50 mg total) by mouth daily. Qty: 30 tablet, Refills: 0    traZODone (DESYREL) 100 MG tablet Take 1 tablet (100 mg total) by mouth at bedtime. Qty: 30 tablet, Refills: 0      CONTINUE these medications which have NOT CHANGED   Details  escitalopram (LEXAPRO) 10 MG tablet Take 20 mg by mouth daily.      STOP taking these medications     aspirin 81 MG EC tablet      baclofen (LIORESAL) 10 MG tablet  glipiZIDE (GLUCOTROL) 5 MG tablet      HYDROcodone-acetaminophen (NORCO/VICODIN) 5-325 MG tablet      hydrOXYzine (ATARAX/VISTARIL) 25 MG tablet      oxybutynin (DITROPAN) 5 MG tablet      tamsulosin (FLOMAX) 0.4 MG CAPS capsule      metFORMIN (GLUCOPHAGE) 1000 MG tablet      primidone (MYSOLINE) 50 MG tablet          DISCHARGE INSTRUCTIONS:   Follow-up Dr. [facility] one day Follow-up Dr. Tobi Bastos gastroenterology 2 weeks   If you experience worsening of your admission symptoms, develop shortness of breath, life threatening emergency, suicidal or homicidal thoughts you must seek medical attention immediately by calling 911 or calling your MD immediately  if symptoms less severe.  You Must read complete instructions/literature along with all the possible adverse reactions/side effects for all the Medicines you take and that have been prescribed to you. Take any  new Medicines after you have completely understood and accept all the possible adverse reactions/side effects.   Please note  You were cared for by a hospitalist during your hospital stay. If you have any questions about your discharge medications or the care you received while you were in the hospital after you are discharged, you can call the unit and asked to speak with the hospitalist on call if the hospitalist that took care of you is not available. Once you are discharged, your primary care physician will handle any further medical issues. Please note that NO REFILLS for any discharge medications will be authorized once you are discharged, as it is imperative that you return to your primary care physician (or establish a relationship with a primary care physician if you do not have one) for your aftercare needs so that they can reassess your need for medications and monitor your lab values.    Today   CHIEF COMPLAINT:   Chief Complaint  Patient presents with  . Altered Mental Status  . Hyperglycemia    HISTORY OF PRESENT ILLNESS:  Kylin Dubs  is a 66 y.o. male resented with altered mental status hyperglycemia and had a code   VITAL SIGNS:  Blood pressure (!) 103/52, pulse 85, temperature 97.9 F (36.6 C), resp. rate 20, height  (1.651 m), weight (!) 137 kg (302 lb 0.5 oz), SpO2 94 %.   PHYSICAL EXAMINATION:  GENERAL:  66 y.o.-year-old patient lying in the bed with no acute distress.  EYES: Pupils equal, round, reactive to light and accommodation. Positive for scleral icterus. Extraocular muscles intact.  HEENT: Head atraumatic, normocephalic. Oropharynx and nasopharynx clear.  NECK:  Supple, no jugular venous distention. No thyroid enlargement, no tenderness.  LUNGS: Normal breath sounds bilaterally, no wheezing, rales,rhonchi or crepitation. No use of accessory muscles of respiration.  CARDIOVASCULAR: S1, S2 normal. 4/6 systolic murmurs, no rubs, or gallops.  ABDOMEN:  Soft, non-tender, non-distended. Bowel sounds present. No organomegaly or mass.  EXTREMITIES: 3+edema, nocyanosis, or clubbing.  NEUROLOGIC: Cranial nerves II through XII are intact. Muscle strength 5/5 in all extremities. Sensation intact. Gait not checked.  PSYCHIATRIC: The patient is alert and oriented x 3.  SKIN: chronic lower extremity discoloration bilaterally, erythema under skin folds and scrotum Genitourinary; 4+ scrotal edema  DATA REVIEW:   CBC  Recent Labs Lab 08/28/17 0504 09/01/17 0440  WBC 5.9  --   HGB 10.3* 9.6*  HCT 31.2*  --   PLT 56*  --     Chemistries   Recent Labs Lab 09/02/17  0449  NA 134*  K 4.7  CL 97*  CO2 29  GLUCOSE 250*  BUN 27*  CREATININE 1.04  CALCIUM 8.1*  AST 193*  ALT 104*  ALKPHOS 159*  BILITOT 12.7*    Microbiology Results  Results for orders placed or performed during the hospital encounter of 08/16/17  Blood Culture (routine x 2)     Status: None   Collection Time: 08/16/17 12:50 AM  Result Value Ref Range Status   Specimen Description BLOOD LEFT ARM  Final   Special Requests   Final    BOTTLES DRAWN AEROBIC AND ANAEROBIC Blood Culture adequate volume   Culture NO GROWTH 5 DAYS  Final   Report Status 08/21/2017 FINAL  Final  Blood Culture (routine x 2)     Status: None   Collection Time: 08/16/17 12:51 AM  Result Value Ref Range Status   Specimen Description BLOOD LEFT ANTECUBITAL  Final   Special Requests   Final    BOTTLES DRAWN AEROBIC AND ANAEROBIC Blood Culture adequate volume   Culture NO GROWTH 5 DAYS  Final   Report Status 08/21/2017 FINAL  Final  Urine culture     Status: Abnormal   Collection Time: 08/16/17 12:51 AM  Result Value Ref Range Status   Specimen Description URINE, RANDOM  Final   Special Requests NONE  Final   Culture MULTIPLE SPECIES PRESENT, SUGGEST RECOLLECTION (A)  Final   Report Status 08/17/2017 FINAL  Final  MRSA PCR Screening     Status: None   Collection Time: 08/16/17  4:56 AM   Result Value Ref Range Status   MRSA by PCR NEGATIVE NEGATIVE Final    Comment:        The GeneXpert MRSA Assay (FDA approved for NASAL specimens only), is one component of a comprehensive MRSA colonization surveillance program. It is not intended to diagnose MRSA infection nor to guide or monitor treatment for MRSA infections.   Body fluid culture     Status: None   Collection Time: 08/25/17  9:04 AM  Result Value Ref Range Status   Specimen Description PERITONEAL  Final   Special Requests NONE  Final   Gram Stain   Final    RARE WBC PRESENT, PREDOMINANTLY MONONUCLEAR NO ORGANISMS SEEN    Culture   Final    NO GROWTH 3 DAYS Performed at [facility], [address], [place], [place] [address]    Report Status 08/28/2017 FINAL  Final     Management plans discussed with the patient, family and they are in agreement.  CODE STATUS:     Code Status Orders        Start     Ordered   08/19/17 1335  Do not attempt resuscitation (DNR)  Continuous    Question Answer Comment  In the event of cardiac or respiratory ARREST Do not call a "code blue"   In the event of cardiac or respiratory ARREST Do not perform Intubation, CPR, defibrillation or ACLS   In the event of cardiac or respiratory ARREST Use medication by any route, position, wound care, and other measures to relive pain and suffering. May use oxygen, suction and manual treatment of airway obstruction as needed for comfort.      08/19/17 1334    Code Status History    Date Active Date Inactive Code Status Order ID Comments User Context   08/16/2017  4:04 AM 08/19/2017  1:34 PM Full Code [id number]  Ihor Austin, MD Inpatient  05/28/2017  5:15 AM 06/04/2017  8:10 PM Full Code [id number]  Arnaldo Natal, MD Inpatient      TOTAL TIME TAKING CARE OF THIS PATIENT: 40 minutes.    Alford Highland M.D on 09/02/2017 at 8:30 AM  Between 7am to 6pm - Pager - [phone]  After 6pm go to [email or web address] -  password [employer]  [employer] [facility] Office  [phone]  CC: Primary care physician; [facility]

## 2017-09-02 NOTE — Clinical Social Work Placement (Signed)
   CLINICAL SOCIAL WORK PLACEMENT  NOTE  Date:  09/02/2017  Patient Details  Name: Jesus [name] MRN: [id number] Date of Birth: 11/24/1951  Clinical Social Work is seeking post-discharge placement for this patient at the Skilled  Nursing Facility level of care (*CSW will initial, date and re-position this form in  chart as items are completed):  Yes   Patient/family provided with [facility] Clinical Social Work Department's list of facilities offering this level of care within the geographic area requested by the patient (or if unable, by the patient's family).  Yes   Patient/family informed of their freedom to choose among providers that offer the needed level of care, that participate in Medicare, Medicaid or managed care program needed by the patient, have an available bed and are willing to accept the patient.  Yes   Patient/family informed of [facility]'s ownership interest in [facility] and [facility], as well as of the fact that they are under no obligation to receive care at these facilities.  PASRR submitted to EDS on       PASRR number received on       Existing PASRR number confirmed on 08/23/17     FL2 transmitted to all facilities in geographic area requested by pt/family on 08/23/17     FL2 transmitted to all facilities within larger geographic area on 08/23/17     Patient informed that his/her managed care company has contracts with or will negotiate with certain facilities, including the following:        Yes   Patient/family informed of bed offers received.  Patient chooses bed at [facility]     Physician recommends and patient chooses bed at      Patient to be transferred to [facility] on 09/02/17.  Patient to be transferred to facility by [facility]     Patient family notified on 09/02/17 of transfer.  Name of family member notified:  Daughter Melissa     PHYSICIAN Please sign DNR, Please sign  FL2     Additional Comment:    _______________________________________________ Darleene Cleaver, LCSWA 09/02/2017, 11:43 AM

## 2017-09-03 NOTE — Progress Notes (Signed)
Patient ID: Jesus [name], male   DOB: 12/21/1950, 66 y.o.   MRN: [id number]  Daughter called me and asked me some questions about prognosis, long-term care and whether he would benefit from physical therapy. She was told by the rehabilitation facility that he would not benefit from physical therapy. I think that he would benefit from physical therapy and being able to move around and that's what these facilities are geared to do.  His overall prognosis is going to be linked to his liver disease. Shorter term I think if he gets stronger it would be beneficial. His mental status is good enough at this point where he is able to follow commands. He is able to straight leg raise off the bed side do think if he can walk around it would be beneficial.  Daughter states that he does have long term care insurance and may be interested in having him placed long-term.  Dr. Alford Highland

## 2017-09-23 ENCOUNTER — Encounter: Payer: Self-pay | Admitting: Gastroenterology

## 2017-09-23 ENCOUNTER — Ambulatory Visit (INDEPENDENT_AMBULATORY_CARE_PROVIDER_SITE_OTHER): Payer: Medicare PPO | Admitting: Gastroenterology

## 2017-09-23 VITALS — BP 101/68 | HR 71 | Temp 98.3°F | Ht 65.0 in | Wt 217.4 lb

## 2017-09-23 DIAGNOSIS — K7460 Unspecified cirrhosis of liver: Secondary | ICD-10-CM

## 2017-09-23 DIAGNOSIS — K7581 Nonalcoholic steatohepatitis (NASH): Secondary | ICD-10-CM

## 2017-09-23 NOTE — Progress Notes (Signed)
Jonathon Bellows MD, MRCP(U.K) [address]  [place]  [place], [place] [address]  Main: [phone]  Fax: [phone]   Primary Care Physician: System, Pcp Not In  Primary Gastroenterologist:  Dr. Jonathon Bellows   No chief complaint on file.   HPI: Jesus [name] is a 66 y.o. male he is here today for a hospital follow up.He was admitted to the hospital recently in 08/2017  with PEA arrest x 2 , acute on chronic anemia with prior evaluation at the [place] with no source of blood loss seen. H/o liver cirrhosis . On this admission noted to have a possible shocked liver, on pressors , received blood. 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 At discharge I had recommended to start him om nadolol . At close to discharge his CP score was 13 , there was concern of portal vein thrombosis on his scan but decision to not anticoagulate was made due to recent variceal life threatening bleed. He was eventually discharged on 09/02/17.   Interval history   08/27/2017-  09/23/2017   Hepatic Function Latest Ref Rng & Units 09/02/2017 09/01/2017 08/30/2017  Total Protein 6.5 - 8.1 g/dL 6.0(L) 5.6(L) 5.4(L)  Albumin 3.5 - 5.0 g/dL 1.8(L) 1.8(L) 1.9(L)  AST 15 - 41 U/L 193(H) 166(H) 136(H)  ALT 17 - 63 U/L 104(H) 97(H) 81(H)  Alk Phosphatase 38 - 126 U/L 159(H) 161(H) 147(H)  Total Bilirubin 0.3 - 1.2 mg/dL 12.7(H) 12.4(H) 12.2(H)  Bilirubin, Direct 0.1 - 0.5 mg/dL - - -     Went to rehab after discharge and is doing well. Did well at therapy. No blood in stool. Brown in color, no blood in nose. Slow with responses , some issues with memory which are new. Unsure if he is or not on nadolol. They will go home and call me .  No list of medications which he takes. He is on home hospice. Has a nurse who comes in . Dr Gilford Rile from hospice     Current Outpatient Prescriptions  Medication Sig Dispense Refill  . albuterol (PROVENTIL HFA) 108 (90 Base) MCG/ACT inhaler Inhale  into the lungs.    Marland Kitchen buPROPion (WELLBUTRIN SR) 150 MG 12 hr tablet     . Butenafine HCl (MENTAX) 1 % cream Apply topically.    . chlorthalidone (HYGROTON) 50 MG tablet     . flunisolide (NASALIDE) 25 MCG/ACT (0.025%) SOLN 25 mcg (0.025 %)    . glyBURIDE (DIABETA) 5 MG tablet 5 mg    . HYDROcodone-acetaminophen (NORCO/VICODIN) 5-325 MG tablet     . LEVOFLOXACIN PO     . lisinopril (PRINIVIL,ZESTRIL) 40 MG tablet     . lisinopril-hydrochlorothiazide (PRINZIDE,ZESTORETIC) 10-12.5 MG tablet     . meloxicam (MOBIC) 7.5 MG tablet     . metFORMIN (GLUCOPHAGE) 850 MG tablet     . potassium citrate (UROCIT-K) 10 MEQ (1080 MG) SR tablet     . escitalopram (LEXAPRO) 10 MG tablet Take 20 mg by mouth daily.    . feeding supplement, ENSURE ENLIVE, (ENSURE ENLIVE) LIQD Take 237 mLs by mouth 4 (four) times daily. 120 Bottle 0  . ferrous sulfate 325 (65 FE) MG tablet Take 1 tablet (325 mg total) by mouth 2 (two) times daily with a meal. 60 tablet 0  . furosemide (LASIX) 20 MG tablet Take 3 tablets (60 mg total) by mouth 2 (two) times daily. 180 tablet 0  . Hydrocortisone (GERHARDT'S BUTT CREAM)  CREA Apply 1 application topically 2 (two) times daily. 60 each 0  . insulin aspart (NOVOLOG) 100 UNIT/ML injection Inject 6 Units into the skin 3 (three) times daily before meals. 10 mL 0  . insulin glargine (LANTUS) 100 UNIT/ML injection Inject 0.25 mLs (25 Units total) into the skin at bedtime. 10 mL 0  . lactulose (CHRONULAC) 10 GM/15ML solution Take 15 mLs (10 g total) by mouth daily. 240 mL 0  . nadolol (CORGARD) 20 MG tablet Take 0.5 tablets (10 mg total) by mouth daily. 15 tablet 0  . naphazoline-glycerin (CLEAR EYES) 0.012-0.2 % SOLN Place 1-2 drops into both eyes 4 (four) times daily. 30 mL 0  . nystatin (MYCOSTATIN/NYSTOP) powder Apply topically 2 (two) times daily. 60 g 0  . pantoprazole (PROTONIX) 40 MG tablet Take 1 tablet (40 mg total) by mouth 2 (two) times daily. 30 tablet 0  . potassium chloride SA  (K-DUR,KLOR-CON) 20 MEQ tablet Take 1 tablet (20 mEq total) by mouth daily. 30 tablet 0  . rifaximin (XIFAXAN) 550 MG TABS tablet Take 1 tablet (550 mg total) by mouth 2 (two) times daily. 60 tablet 0  . spironolactone (ALDACTONE) 50 MG tablet Take 1 tablet (50 mg total) by mouth daily. 30 tablet 0  . traMADol (ULTRAM) 50 MG tablet Take 1 tablet (50 mg total) by mouth every 6 (six) hours as needed for moderate pain. 12 tablet 0  . traZODone (DESYREL) 100 MG tablet Take 1 tablet (100 mg total) by mouth at bedtime. 30 tablet 0   No current facility-administered medications for this visit.     Allergies as of 09/23/2017 - Review Complete 08/17/2017  Allergen Reaction Noted  . Penicillins Hives and Other (See Comments) 05/28/2017    ROS:  General: Negative for anorexia, weight loss, fever, chills, fatigue, weakness. ENT: Negative for hoarseness, difficulty swallowing , nasal congestion. CV: Negative for chest pain, angina, palpitations, dyspnea on exertion, peripheral edema.  Respiratory: Negative for dyspnea at rest, dyspnea on exertion, cough, sputum, wheezing.  GI: See history of present illness. GU:  Negative for dysuria, hematuria, urinary incontinence, urinary frequency, nocturnal urination.  Endo: Negative for unusual weight change.    Physical Examination:   There were no vitals taken for this visit.  General: appears yellow, comfortable, in whel chair  Eyes:+++ icterus. Conjunctivae pink. Mouth: Oropharyngeal mucosa moist and pink , no lesions erythema or exudate. Lungs: Clear to auscultation bilaterally. Non-labored. Heart: Regular rate and rhythm, no murmurs rubs or gallops.  Abdomen: Bowel sounds are normal, nontender, nondistended, no hepatosplenomegaly or masses, no abdominal bruits or hernia , no rebound or guarding.   Neuro: Alert and oriented x 3.  Grossly intact. Skin: Warm and dry, no jaundice.   Psych: Alert and cooperative, normal mood and affect.  BP 101/68 (BP  Location: Left Arm, Patient Position: Sitting, Cuff Size: Large)   Pulse 71   Temp 98.3 F (36.8 C) (Oral)   Ht _0  (1.651 m)   Wt 217 lb 6.4 oz (98.6 kg)   BMI 36.18 kg/m   Imaging Studies: US Abdomen Complete  Result Date: 08/24/2017 CLINICAL DATA:  Abdominal distension EXAM: ABDOMEN ULTRASOUND COMPLETE COMPARISON:  Abdominal ultrasound of August 17, 2017 and abdominal and pelvic CT scan of August 12, 2010. FINDINGS: Gallbladder: The gallbladder is adequately distended. There is echogenic sludge present as well as stones with the largest stone measuring 1.3 cm. The gallbladder wall is thickened measuring between 3.8 and 7.4 mm. Common bile duct: Diameter:  3.6 mm Liver: The hepatic echotexture is increased. The surface contour is nodular. There is no discrete mass or ductal dilation. No flow was visualized within the portal vein. IVC: No abnormality visualized. Pancreas: Bowel gas largely obscures the pancreatic head and tail. The visualized portions of the pancreatic body appear normal. Spleen: There is splenomegaly with splenic length of 19.5 cm and calculated volume of 1240 cc. Right Kidney: Length: 8.6 cm. Echogenicity within normal limits. No mass or hydronephrosis visualized. Left Kidney: Length: 12.4 cm. Echogenicity within normal limits. No mass or hydronephrosis visualized. Abdominal aorta: The abdominal aorta is largely obscured by bowel gas. Other findings: There is ascites. There are bilateral pleural effusions. IMPRESSION: Findings compatible with hepatic cirrhosis. No flow within the portal vein is observed. There is ascites as well as bilateral pleural effusions. There is marked splenomegaly. Gallstones and sludge. Gallbladder wall thickening to as much is 7.4 mm likely related to the ascites. No positive sonographic Murphy sign. Limited visualization of the pancreas. Chronically atrophic right kidney. Electronically Signed   By: David  Martinique M.D.   On: 08/24/2017 09:48   US  Paracentesis  Result Date: 08/25/2017 INDICATION: Patient with history of cirrhosis, ascites. Request is made for diagnostic and therapeutic paracentesis EXAM: ULTRASOUND GUIDED DIAGNOSTIC AND THERAPEUTIC PARACENTESIS MEDICATIONS: 10 mL 1% lidocaine COMPLICATIONS: None immediate. PROCEDURE: Informed written consent was obtained from the patient after a discussion of the risks, benefits and alternatives to treatment. A timeout was performed prior to the initiation of the procedure. Initial ultrasound scanning demonstrates a moderate amount of ascites within the right lateral abdomen. The right lateral abdomen was prepped and draped in the usual sterile fashion. 1% lidocaine was used for local anesthesia. Following this, a 6 Fr Safe-T-Centesis catheter was introduced. An ultrasound image was saved for documentation purposes. The paracentesis was performed. The catheter was removed and a dressing was applied. The patient tolerated the procedure well without immediate post procedural complication. FINDINGS: A total of approximately 4.0 liters of yellow fluid was removed. Samples were sent to the laboratory as requested by the clinical team. IMPRESSION: Successful ultrasound-guided diagnostic and therapeutic paracentesis yielding 4.0 liters of peritoneal fluid. Read by:  Brynda Greathouse PA-C Electronically Signed   By: Inez Catalina M.D.   On: 08/25/2017 10:02    Assessment and Plan:   Jesus [name] is a 66 y.o. y/o male here to follow up after discharge from hospital when he was admitted with a PEA arrest ,variceal bleed, s/p banding ,acute liver failure. Looks like he is doing much better. From family I understand he is on home hospice care. I do not have any information on goals of treatment. I advised them to return ina week , we wil try obtain Dr Derry Skill plan of care and if further evaluation is warranted then we can discuss.     Dr Jonathon Bellows  MD,MRCP [facility]) Follow up in 1 week

## 2017-09-28 ENCOUNTER — Telehealth: Payer: Self-pay | Admitting: Gastroenterology

## 2017-09-28 NOTE — Telephone Encounter (Signed)
Patient stated he is feeling better and doesn't need to come in. Told him to call us if needed.

## 2017-09-30 ENCOUNTER — Ambulatory Visit: Payer: Medicare PPO | Admitting: Gastroenterology

## 2017-09-30 ENCOUNTER — Telehealth: Payer: Self-pay

## 2017-09-30 NOTE — Telephone Encounter (Signed)
Spoke to wife.  My recommendation would then be to increase the dose of nadolol gradually as tolerated till heart rate is between 60-70 /min   Increased to 40mg  of nadolol per day with HR of 60-70/min.  Advised spouse to call once per week with updates and charting of HR.

## 2017-09-30 NOTE — Telephone Encounter (Signed)
-----   Message from Wyline MoodKiran Anna, MD sent at 09/30/2017  9:19 AM EDT ----- Regarding: RE: Level of Treatment Noted message below.  My recommendation would then be to increase the dose of nadolol gradually as tolerated till heart rate is between 60-70 /min  ----- Message ----- From: Ethlyn Galleryarter, Cay Kath, CMA Sent: 09/30/2017   8:36 AM To: Wyline MoodKiran Anna, MD Subject: Level of Treatment                             I spoke to the Nurse @ [facility].   She states medication & lab treatment is fine.  No invasive / aggressive treatment is allowed unless requested by the family. Family would notify Hospice of changes to his treatment.

## 2017-10-24 LAB — BLOOD GAS, ARTERIAL
ACID-BASE DEFICIT: 16.2 mmol/L — AB (ref 0.0–2.0)
Bicarbonate: 14.4 mmol/L — ABNORMAL LOW (ref 20.0–28.0)
FIO2: 0.5
MECHVT: 500 mL
Mechanical Rate: 20
O2 SAT: 85.4 %
PCO2 ART: 61 mmHg — AB (ref 32.0–48.0)
PEEP: 5 cmH2O
PO2 ART: 78 mmHg — AB (ref 83.0–108.0)
Patient temperature: 37

## 2017-10-31 DEATH — deceased

## 2019-07-14 IMAGING — US US ABDOMEN LIMITED
2 series · 13 of 25 positions shown · non-contrast
Comparison: CT abdomen pelvis -08/12/2010

CLINICAL DATA: Abnormal LFTs.

EXAM:
ULTRASOUND ABDOMEN LIMITED RIGHT UPPER QUADRANT
TECHNIQUE: Standard sonographic evaluation the right upper abdominal quadrant,
followed by the acquisition of color and duplex Doppler ultrasound
was performed to evaluate the hepatic in-flow and out-flow vessels.

[Series 1: us abdomen limited · 0.25mm/px · 5 of 32 slices shown (1 of 2)]
[im 1/32]
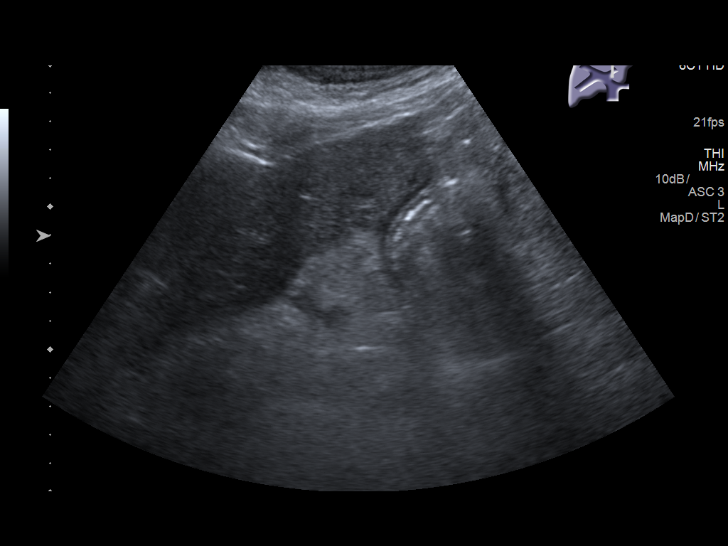
[im 7/32]
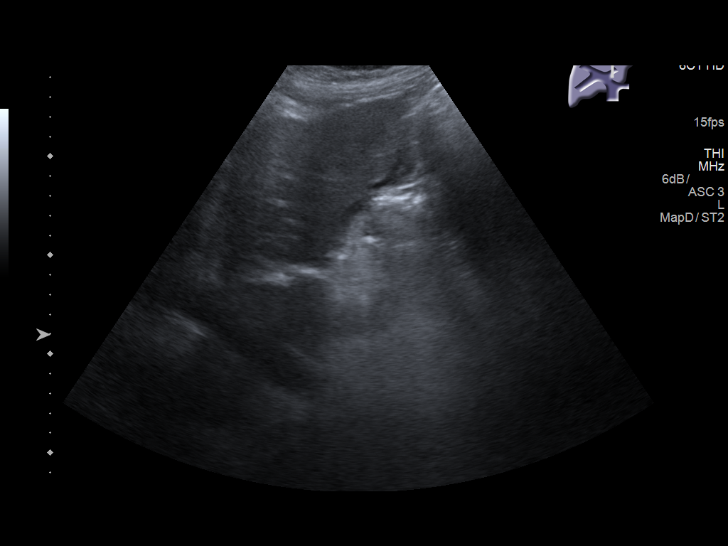
[im 14/32]
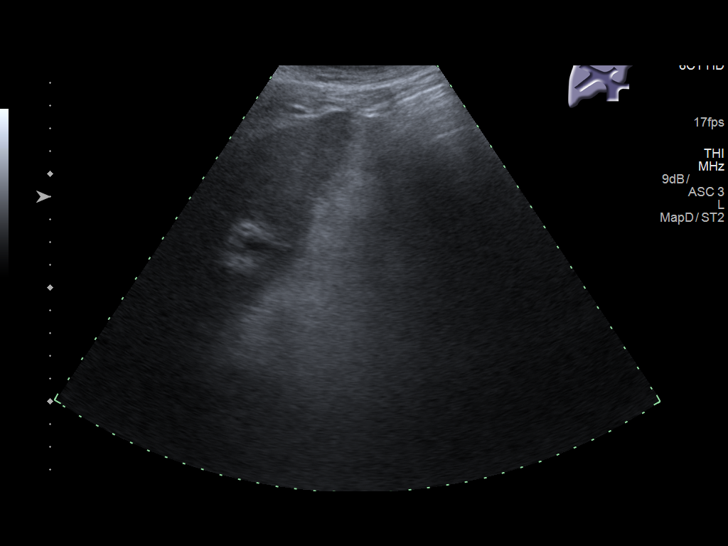
[im 21/32]
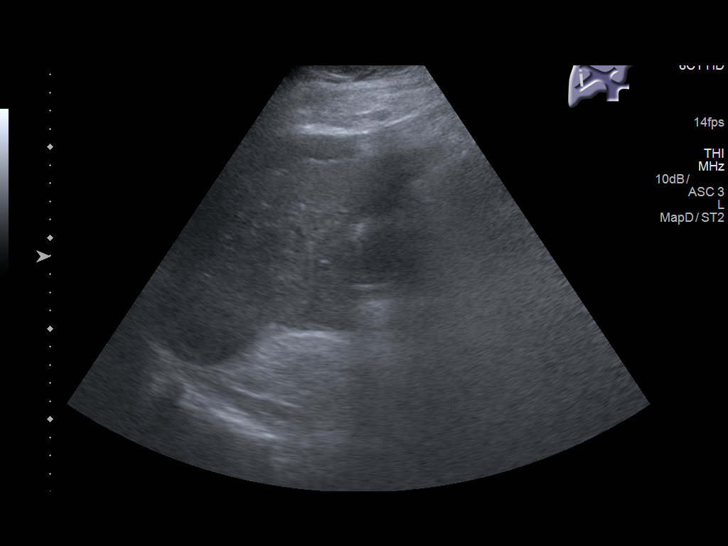
[im 28/32]
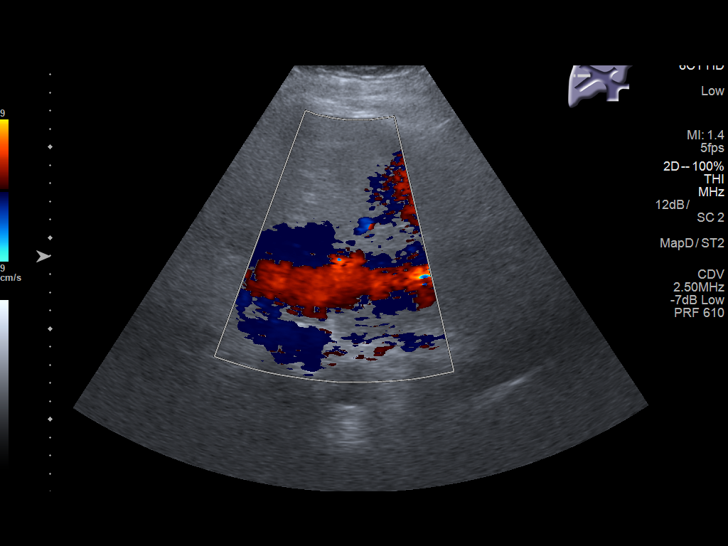

[Series 2: us abdomen limited · 0.33mm/px · 8 of 46 slices shown (2 of 2)]
[im 1/46]
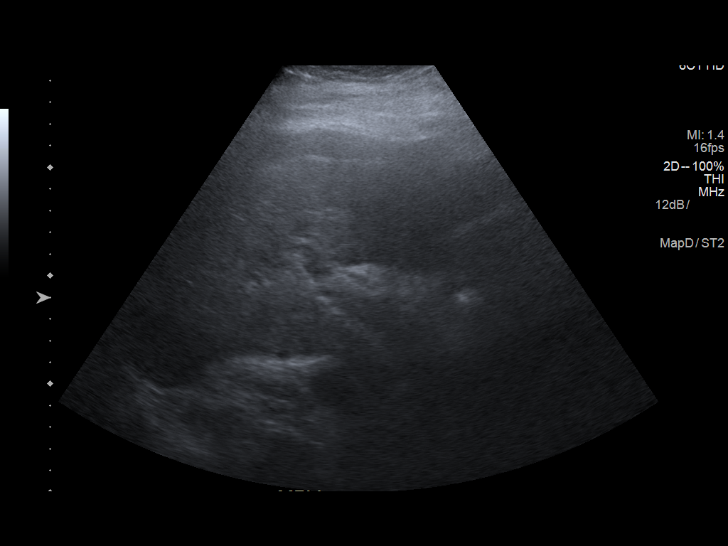
[im 7/46]
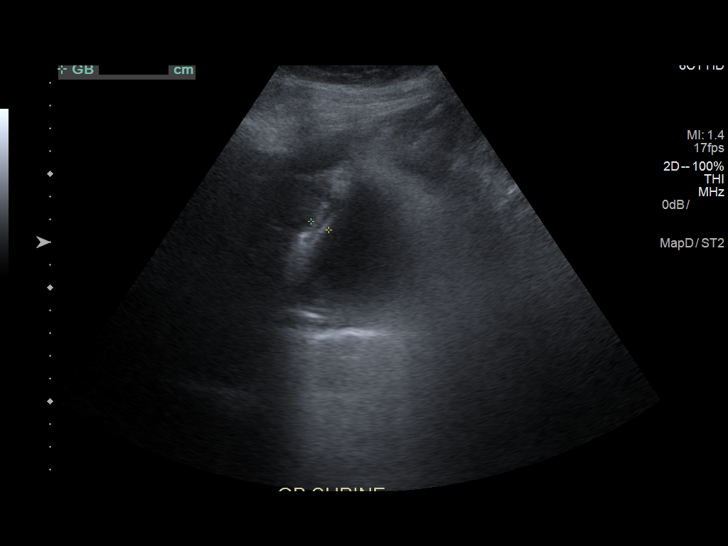
[im 13/46]
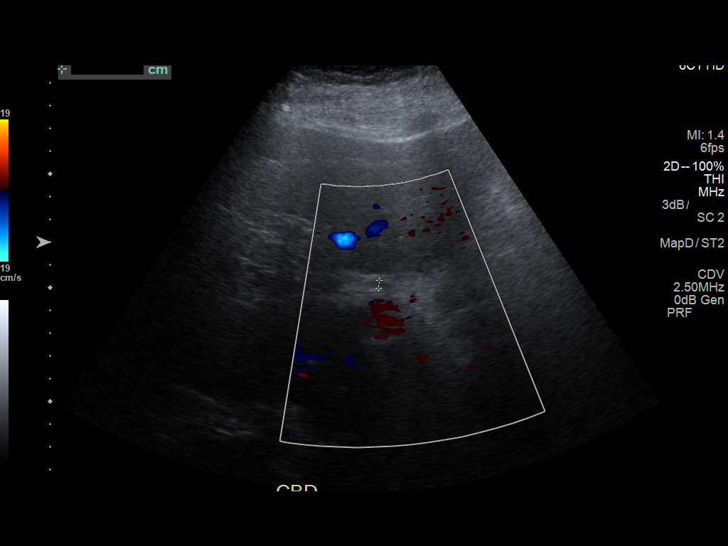
[im 20/46]
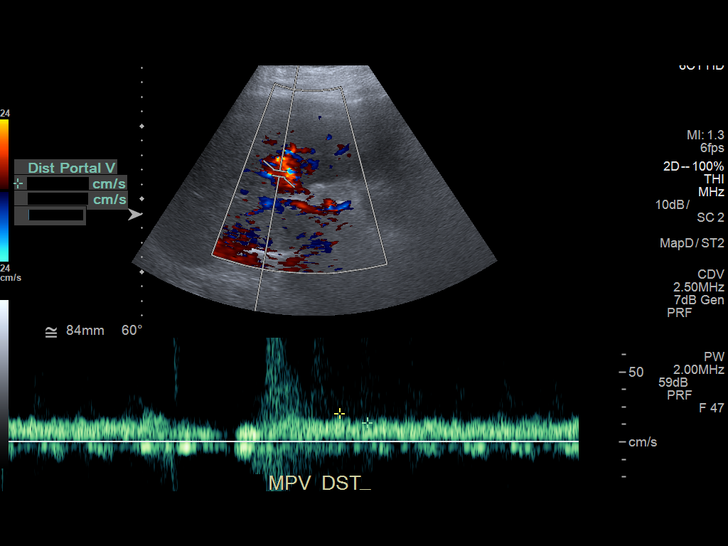
[im 26/46]
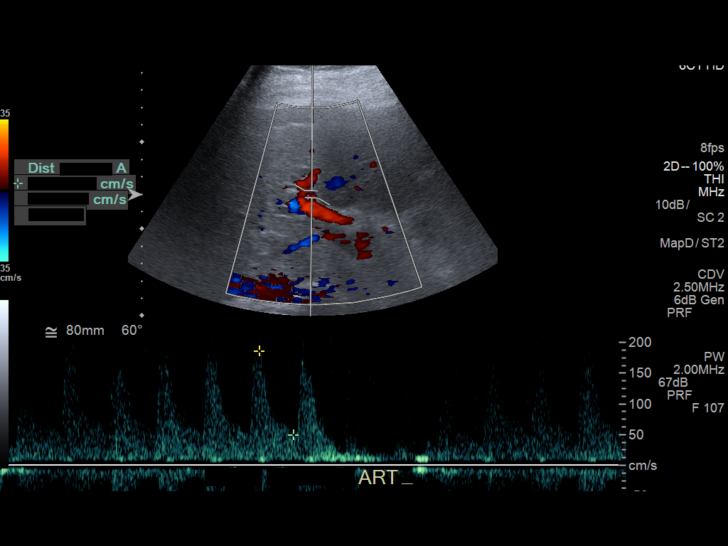
[im 33/46]
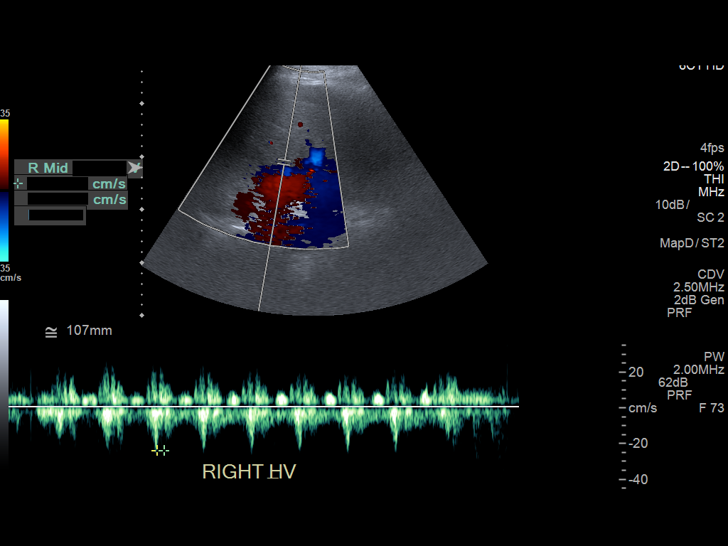
[im 39/46]
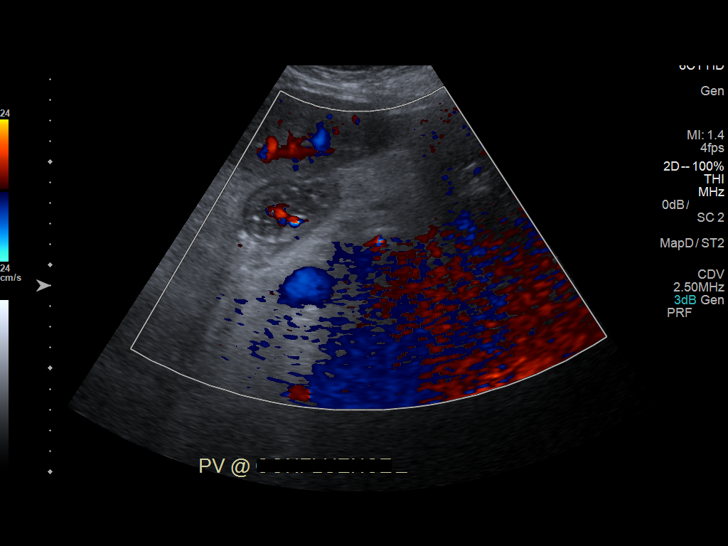
[im 46/46]
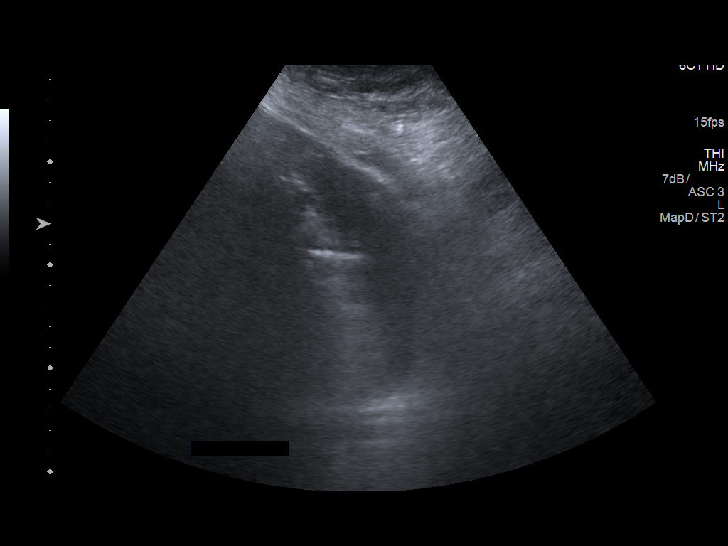

[13 of 25 positions shown; findings below may reference images not displayed]

FINDINGS: Gallbladder:

Several echogenic gallstones a minimal amount of mixed echogenic
suspected biliary sludge is seen within the gallbladder. Note is
made of a small amount of pericholecystic fluid, a nonspecific
finding in the setting of intra-abdominal ascites. No definitive
gallbladder wall thickening.

Common bile duct:

Diameter: Normal in size measuring 4 mm in diameter

Liver:

There is diffuse increased slightly coarsened echogenicity of the
hepatic parenchyma. Note is made of mild nodularity of the hepatic
contour. No discrete hepatic lesions though note sonographic
evaluation degraded secondary to patient's intubated state.

Portal Vein Velocities

Main:  17.8 cm/sec

Right:  13.7 cm/sec

Left:  13.4 cm/sec

Hepatic Vein Velocities

Right:  23.9 cm/sec

Middle:  44.1 cm/sec

Left:  50.5 cm/sec

Hepatic Artery Velocity:  186.3 cm/sec

Splenic Vein Velocity:  13.2 cm/sec

Varices: None identified

Ascites: Small amount of perihepatic and intra-abdominal ascites.

The spleen is enlarged measuring 15.3 x 15.3 x 7.7 cm with
calculated volume of 947.5 cm cubed).
IMPRESSION: 1. Findings worrisome for hepatic cirrhosis and portal venous
hypertension including nodular hepatic contour, splenomegaly and
small volume intra-abdominal ascites.
2. Hepatic vascular system appears widely patent with normal
directional flow.

## 2019-07-21 IMAGING — US US ABDOMEN COMPLETE
1 series · 13 of 25 positions shown · non-contrast
Comparison: Abdominal ultrasound August 17, 2017 and
abdominal and pelvic CT scan August 12, 2010.

CLINICAL DATA: Abdominal distension

EXAM:
ABDOMEN ULTRASOUND COMPLETE

[Series 1: us abdomen complete · 0.30mm/px · 13 of 84 slices shown]
[im 1/84]
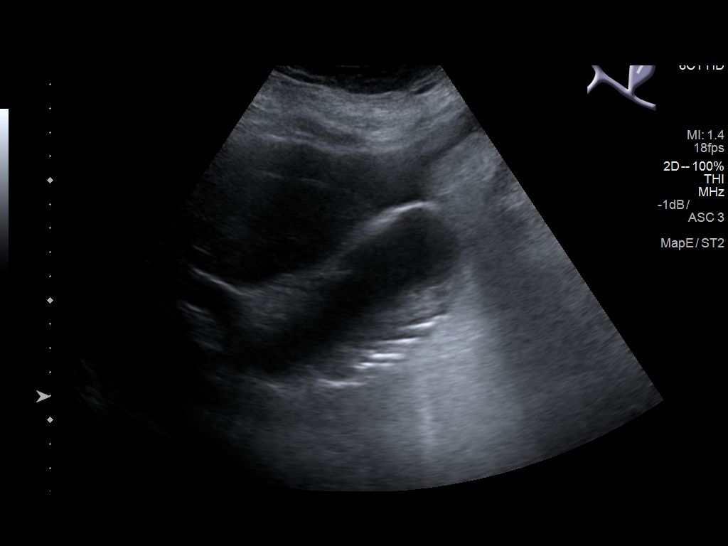
[im 7/84]
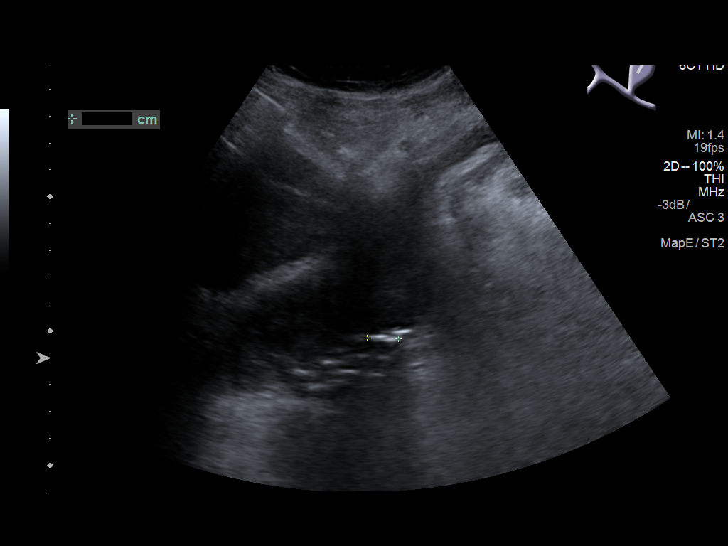
[im 14/84]
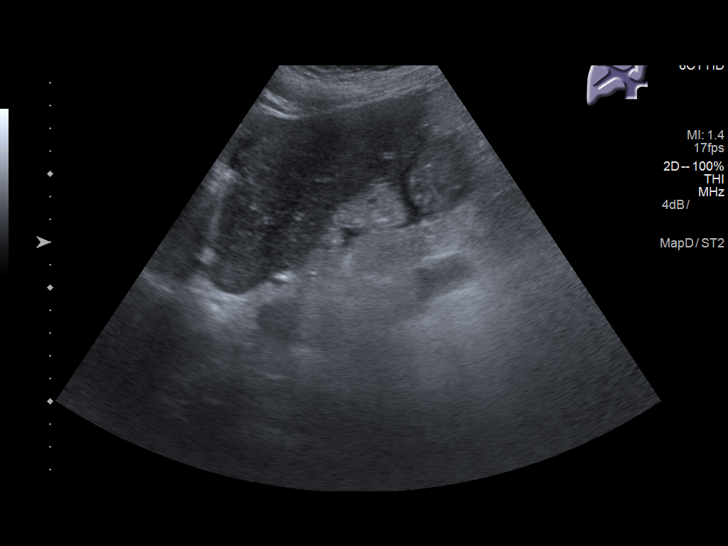
[im 21/84]
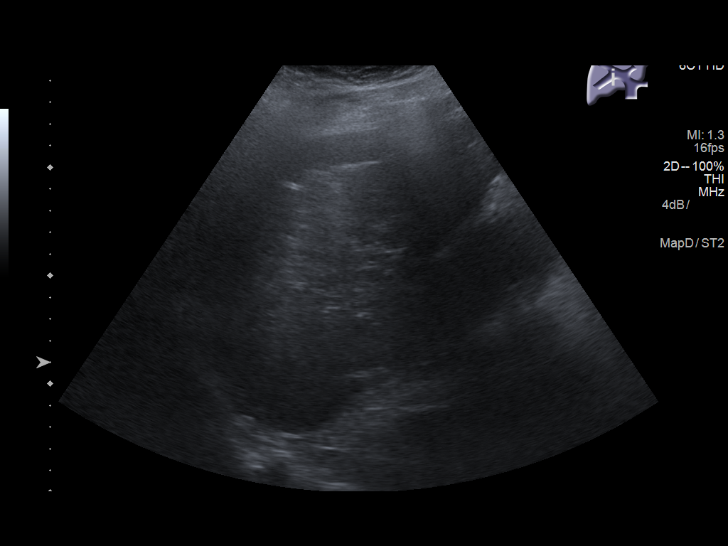
[im 28/84]
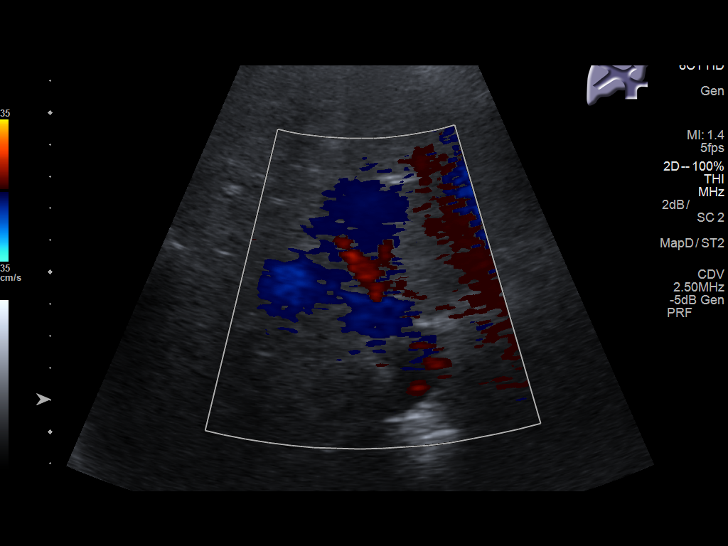
[im 35/84]
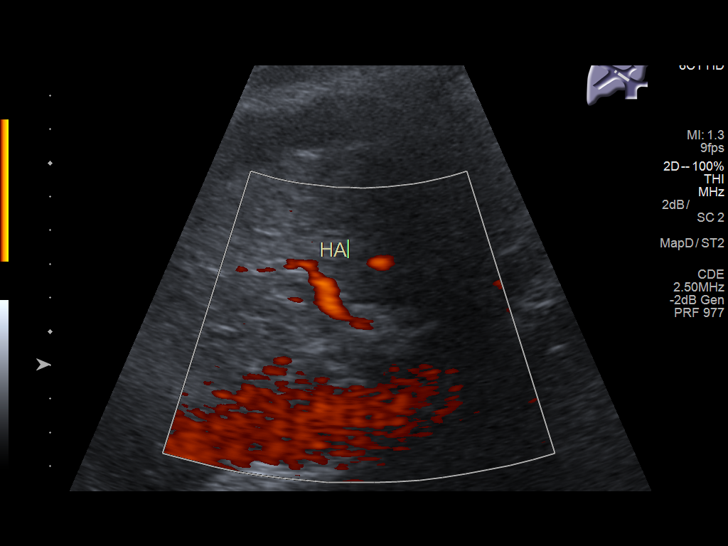
[im 42/84]
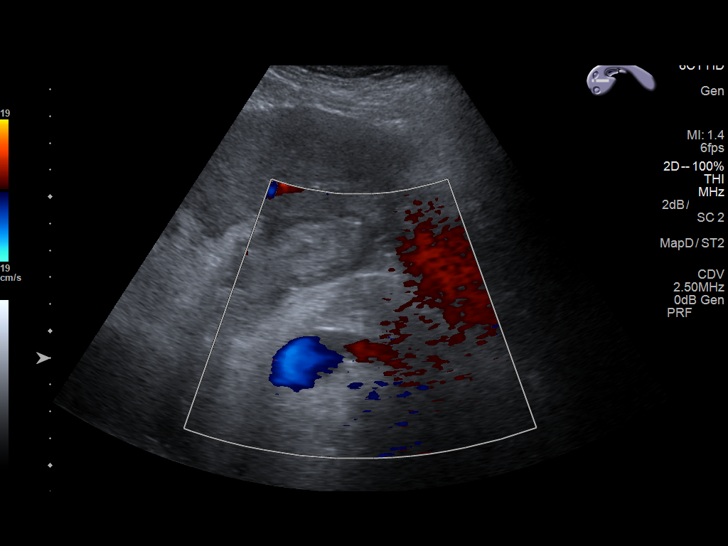
[im 49/84]
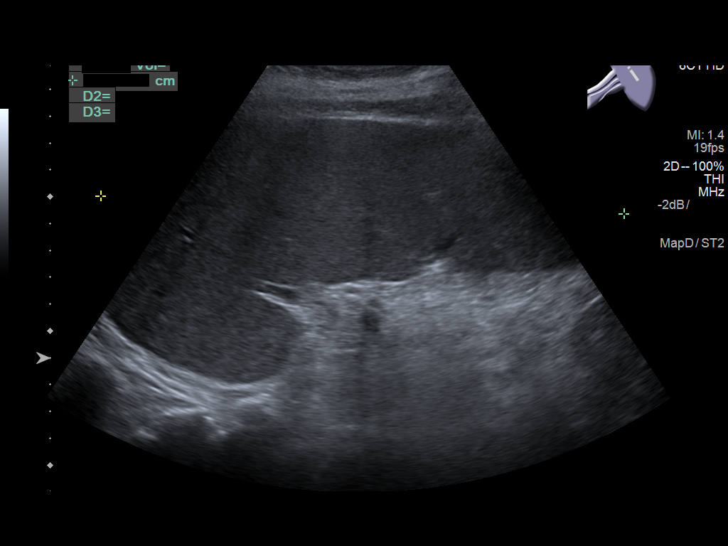
[im 56/84]
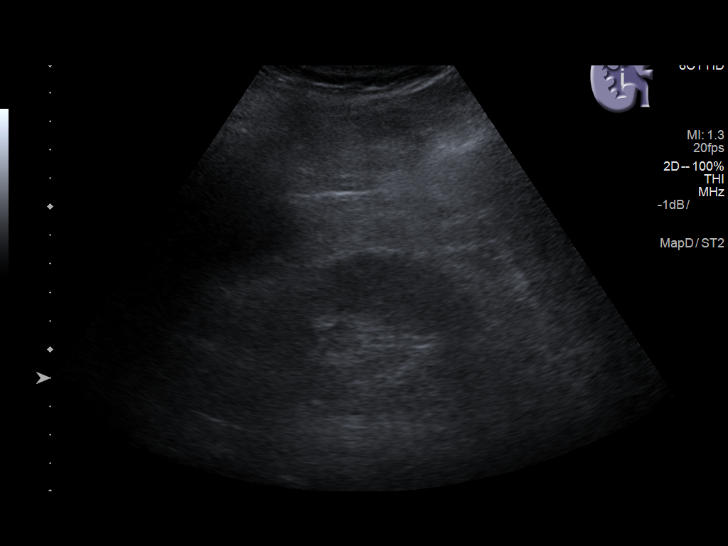
[im 63/84]
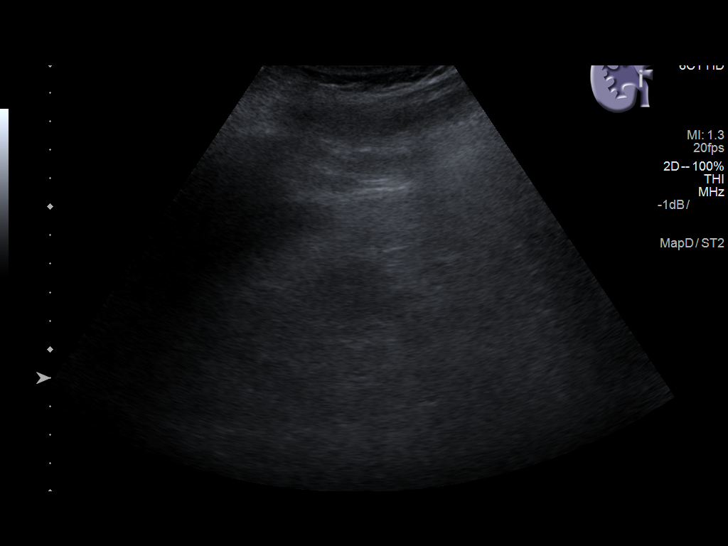
[im 70/84]
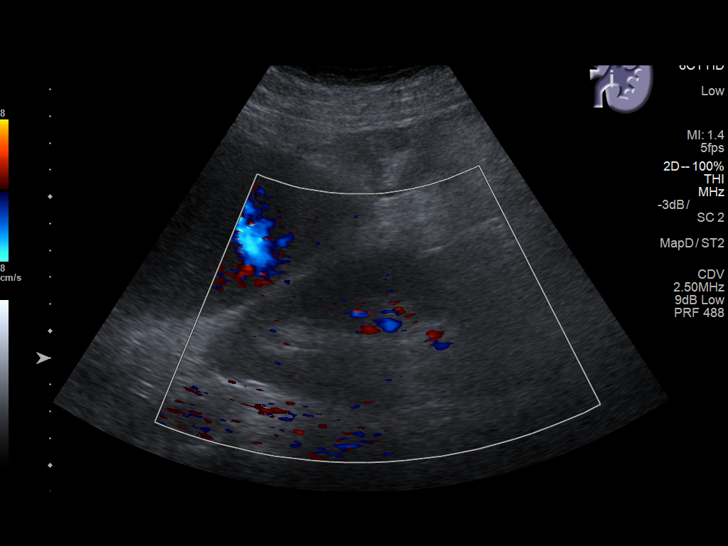
[im 77/84]
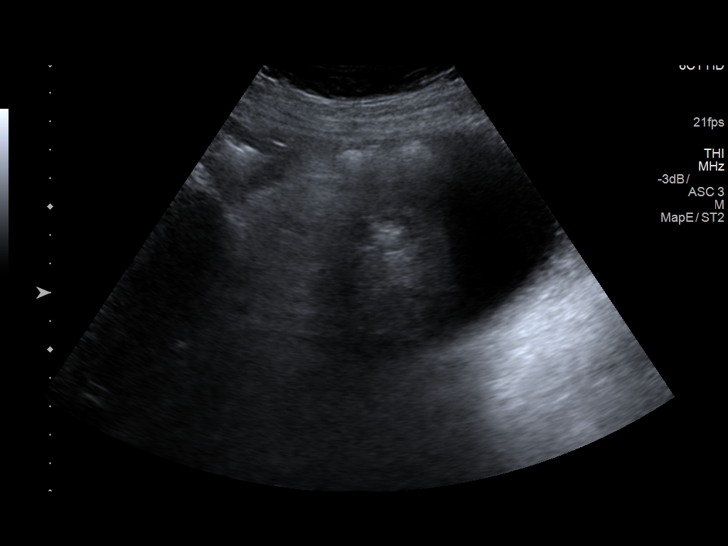
[im 84/84]
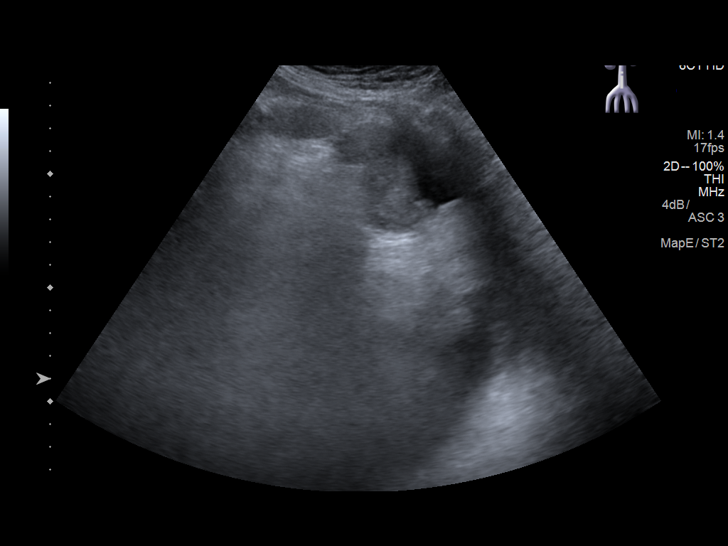

[13 of 25 positions shown; findings below may reference images not displayed]

FINDINGS: Gallbladder: The gallbladder is adequately distended. There is
echogenic sludge present as well as stones with the largest stone
measuring 1.3 cm. The gallbladder wall is thickened measuring
between 3.8 and 7.4 mm.

Common bile duct: Diameter: 3.6 mm

Liver: The hepatic echotexture is increased. The surface contour is
nodular. There is no discrete mass or ductal dilation. No flow was
visualized within the portal vein.

IVC: No abnormality visualized.

Pancreas: Bowel gas largely obscures the pancreatic head and tail.
The visualized portions of the pancreatic body appear normal.

Spleen: There is splenomegaly with splenic length of 19.5 cm and
calculated volume of 3364 cc.

Right Kidney: Length: 8.6 cm. Echogenicity within normal limits. No
mass or hydronephrosis visualized.

Left Kidney: Length: 12.4 cm. Echogenicity within normal limits. No
mass or hydronephrosis visualized.

Abdominal aorta: The abdominal aorta is largely obscured by bowel
gas.

Other findings: There is ascites. There are bilateral pleural
effusions.
IMPRESSION: Findings compatible with hepatic cirrhosis. No flow within the
portal vein is observed. There is ascites as well as bilateral
pleural effusions. There is marked splenomegaly.

Gallstones and sludge. Gallbladder wall thickening to as much is
mm likely related to the ascites. No positive sonographic Murphy
sign.

Limited visualization of the pancreas. Chronically atrophic right
kidney.

## 2019-07-22 IMAGING — US US PARACENTESIS
1 series · 6 of 6 positions shown · non-contrast
Comparison: none

INDICATION: Patient with history of cirrhosis, ascites. Request is made for
diagnostic and therapeutic paracentesis

[Series 1: us paracentesis · 0.26mm/px · 6 of 6 slices shown]
[im 1/6]
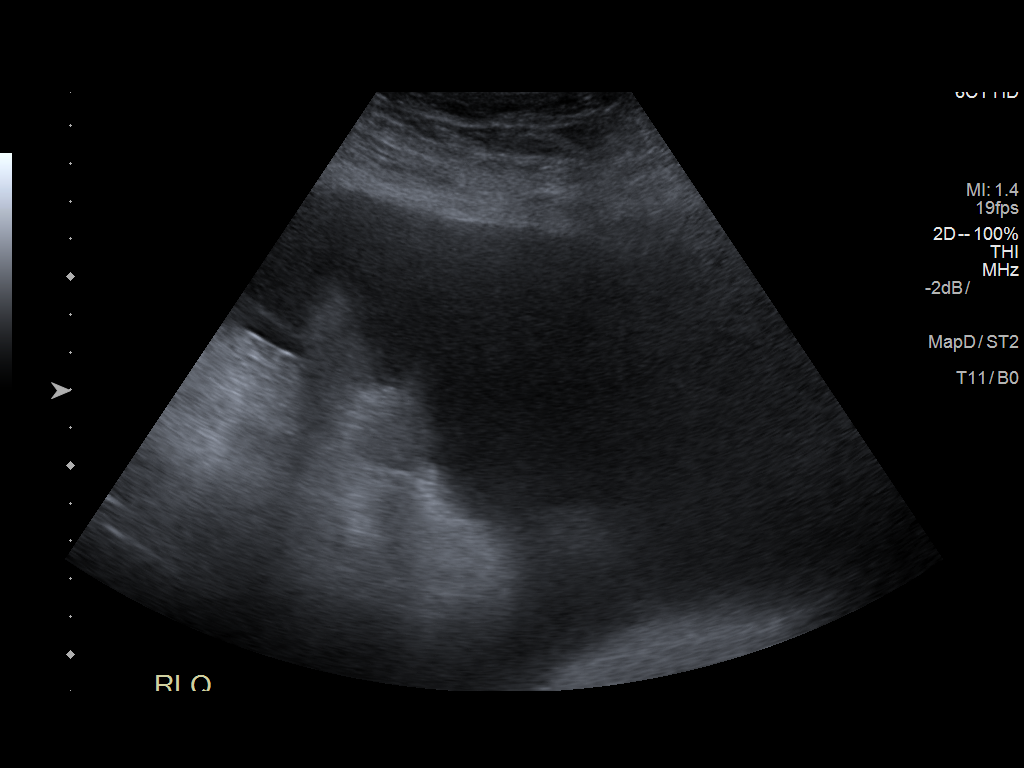
[im 2/6]
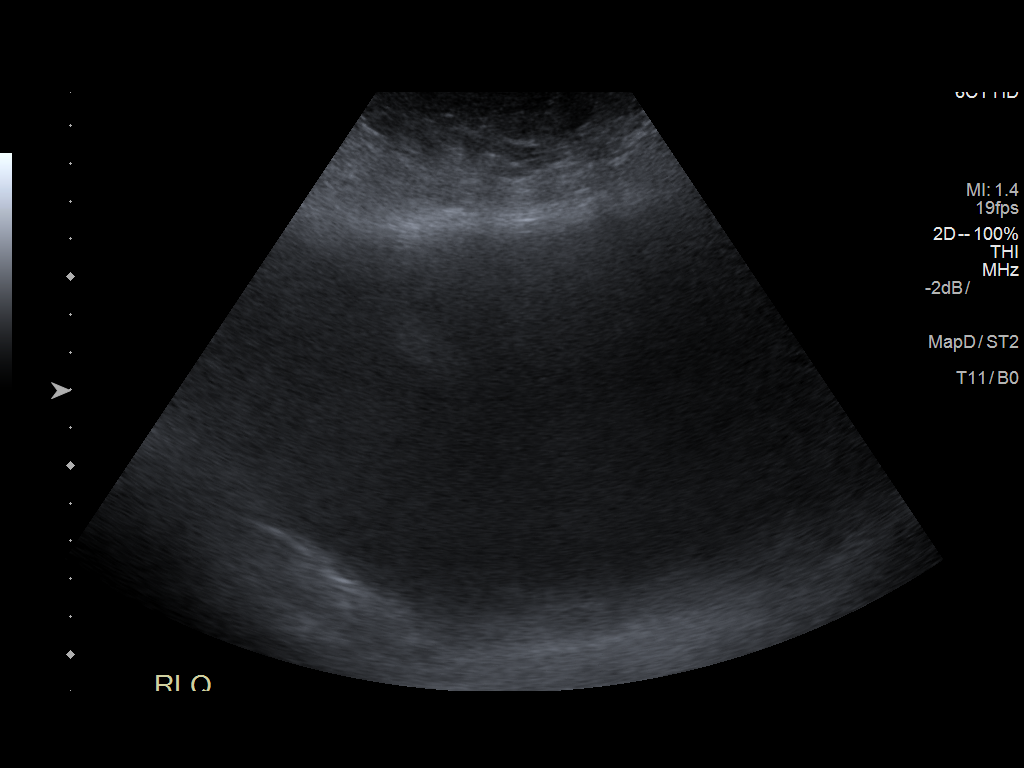
[im 3/6]
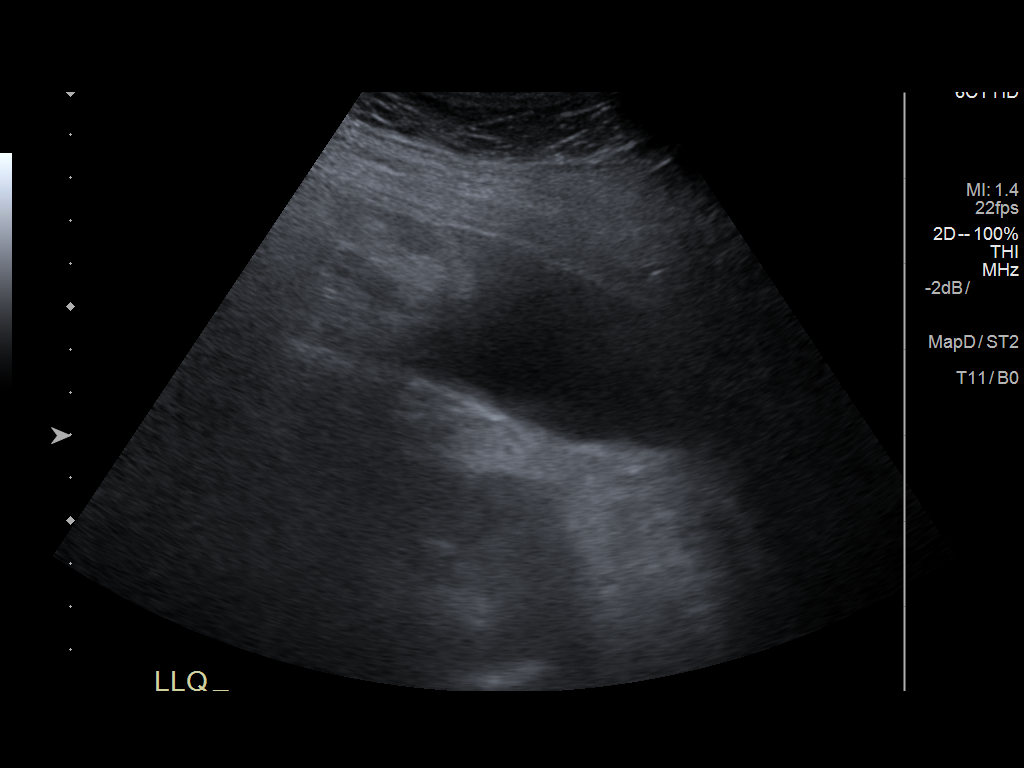
[im 4/6]
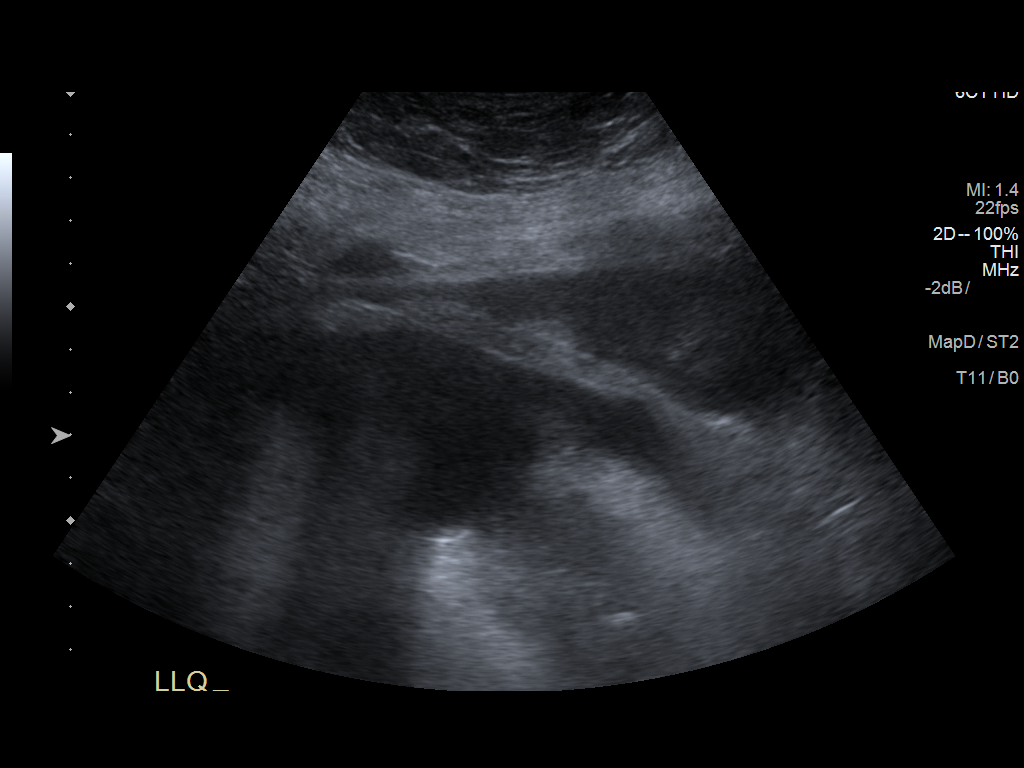
[im 5/6]
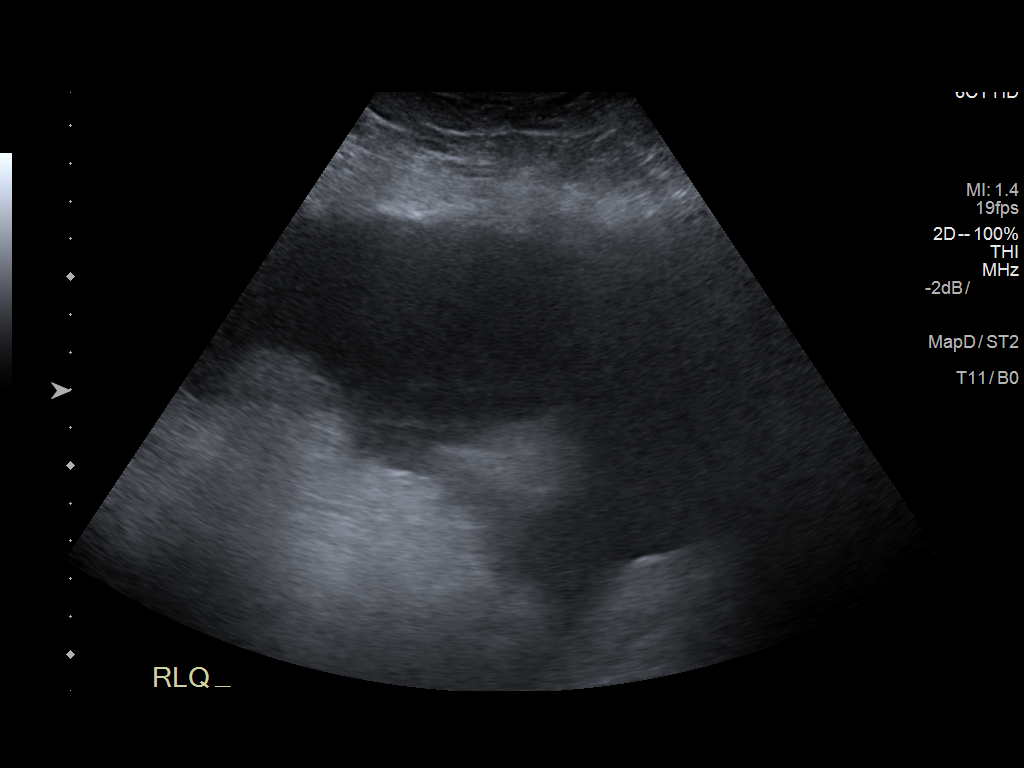
[im 6/6]
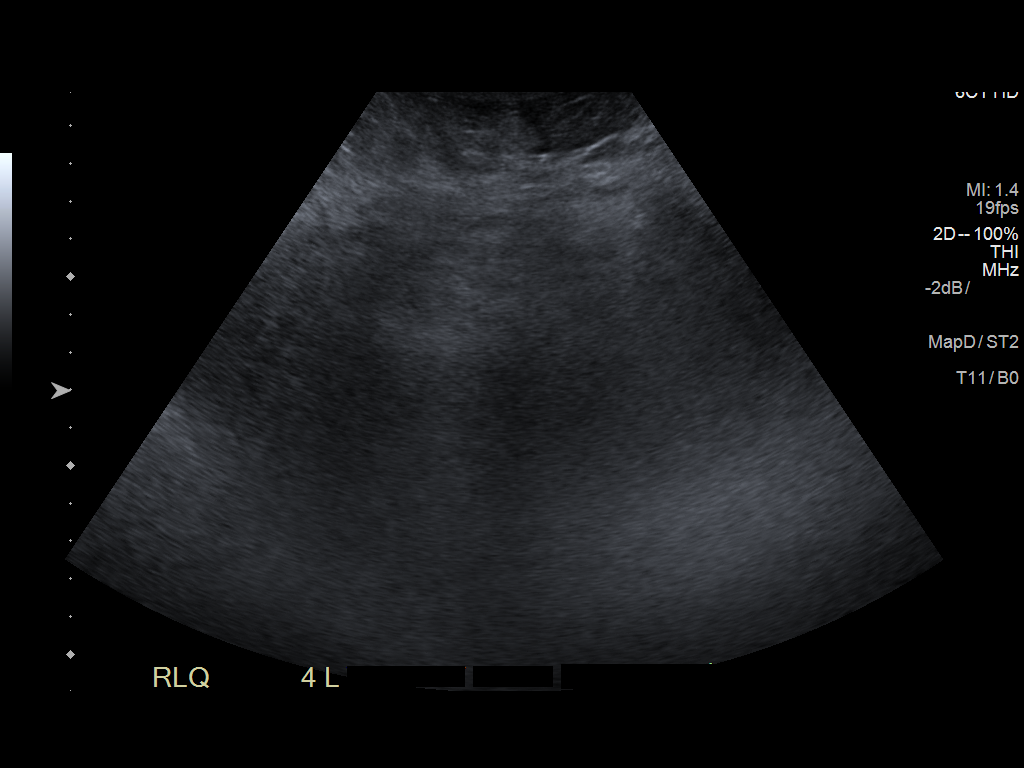

[6 of 6 positions shown; findings below may reference images not displayed]

EXAM:
ULTRASOUND GUIDED DIAGNOSTIC AND THERAPEUTIC PARACENTESIS

MEDICATIONS:
10 mL 1% lidocaine

COMPLICATIONS:
None immediate.

PROCEDURE:
Informed written consent was obtained from the patient after a
discussion of the risks, benefits and alternatives to treatment. A
timeout was performed prior to the initiation of the procedure.

Initial ultrasound scanning demonstrates a moderate amount of
ascites within the right lateral abdomen. The right lateral abdomen
was prepped and draped in the usual sterile fashion. 1% lidocaine
was used for local anesthesia.

Following this, a 6 Fr Safe-T-Centesis catheter was introduced. An
ultrasound image was saved for documentation purposes. The
paracentesis was performed. The catheter was removed and a dressing
was applied. The patient tolerated the procedure well without
immediate post procedural complication.
FINDINGS: A total of approximately 4.0 liters of yellow fluid was removed.
Samples were sent to the laboratory as requested by the clinical
team.
IMPRESSION: Successful ultrasound-guided diagnostic and therapeutic paracentesis
yielding 4.0 liters of peritoneal fluid.
